# Patient Record
Sex: Female | Born: 1955 | Race: White | Hispanic: No | Smoking: Never smoker
Health system: Southern US, Community
[De-identification: ages and names within clinical notes are randomized; demographics above are authoritative.]

## PROBLEM LIST (undated history)

## (undated) DIAGNOSIS — R51 Headache: Secondary | ICD-10-CM

## (undated) DIAGNOSIS — H168 Other keratitis: Secondary | ICD-10-CM

## (undated) DIAGNOSIS — I639 Cerebral infarction, unspecified: Secondary | ICD-10-CM

## (undated) DIAGNOSIS — G4700 Insomnia, unspecified: Secondary | ICD-10-CM

## (undated) DIAGNOSIS — M3500 Sicca syndrome, unspecified: Secondary | ICD-10-CM

## (undated) DIAGNOSIS — I671 Cerebral aneurysm, nonruptured: Secondary | ICD-10-CM

## (undated) DIAGNOSIS — I4891 Unspecified atrial fibrillation: Secondary | ICD-10-CM

## (undated) DIAGNOSIS — I10 Essential (primary) hypertension: Secondary | ICD-10-CM

## (undated) DIAGNOSIS — N179 Acute kidney failure, unspecified: Secondary | ICD-10-CM

## (undated) HISTORY — DX: Cerebral aneurysm, nonruptured: I67.1

## (undated) HISTORY — DX: Sjogren syndrome, unspecified: M35.00

## (undated) HISTORY — DX: Headache: R51

## (undated) HISTORY — DX: Cerebral infarction, unspecified: I63.9

## (undated) HISTORY — PX: ABDOMINAL HYSTERECTOMY: SHX81

## (undated) HISTORY — PX: OVARIAN CYST REMOVAL: SHX89

## (undated) HISTORY — DX: Acute kidney failure, unspecified: N17.9

## (undated) HISTORY — DX: Essential (primary) hypertension: I10

## (undated) HISTORY — DX: Insomnia, unspecified: G47.00

## (undated) HISTORY — DX: Other keratitis: H16.8

## (undated) HISTORY — DX: Unspecified atrial fibrillation: I48.91

---

## 1996-05-23 HISTORY — PX: CEREBRAL ANEURYSM REPAIR: SHX164

## 1998-07-13 ENCOUNTER — Other Ambulatory Visit: Admission: RE | Admit: 1998-07-13 | Discharge: 1998-07-13 | Payer: Self-pay | Admitting: Obstetrics & Gynecology

## 1999-04-22 ENCOUNTER — Encounter: Payer: Self-pay | Admitting: Emergency Medicine

## 1999-04-22 ENCOUNTER — Encounter: Admission: RE | Admit: 1999-04-22 | Discharge: 1999-04-22 | Payer: Self-pay | Admitting: Emergency Medicine

## 1999-05-27 ENCOUNTER — Encounter: Payer: Self-pay | Admitting: Obstetrics & Gynecology

## 1999-05-31 ENCOUNTER — Inpatient Hospital Stay (HOSPITAL_COMMUNITY): Admission: RE | Admit: 1999-05-31 | Discharge: 1999-06-02 | Payer: Self-pay | Admitting: Obstetrics & Gynecology

## 2001-07-03 ENCOUNTER — Emergency Department (HOSPITAL_COMMUNITY): Admission: EM | Admit: 2001-07-03 | Discharge: 2001-07-03 | Payer: Self-pay | Admitting: Emergency Medicine

## 2001-07-03 ENCOUNTER — Encounter: Payer: Self-pay | Admitting: Emergency Medicine

## 2004-10-25 ENCOUNTER — Ambulatory Visit: Payer: Self-pay | Admitting: Family Medicine

## 2005-05-17 ENCOUNTER — Encounter: Admission: RE | Admit: 2005-05-17 | Discharge: 2005-05-17 | Payer: Self-pay | Admitting: Internal Medicine

## 2005-10-27 ENCOUNTER — Ambulatory Visit: Payer: Self-pay | Admitting: Family Medicine

## 2006-06-19 ENCOUNTER — Encounter: Admission: RE | Admit: 2006-06-19 | Discharge: 2006-06-19 | Payer: Self-pay | Admitting: Family Medicine

## 2006-07-20 ENCOUNTER — Ambulatory Visit: Payer: Self-pay | Admitting: Family Medicine

## 2006-11-21 ENCOUNTER — Ambulatory Visit: Payer: Self-pay | Admitting: Family Medicine

## 2006-11-21 DIAGNOSIS — N809 Endometriosis, unspecified: Secondary | ICD-10-CM | POA: Insufficient documentation

## 2006-11-21 DIAGNOSIS — I10 Essential (primary) hypertension: Secondary | ICD-10-CM | POA: Insufficient documentation

## 2006-11-21 DIAGNOSIS — Z9189 Other specified personal risk factors, not elsewhere classified: Secondary | ICD-10-CM | POA: Insufficient documentation

## 2006-11-22 LAB — CONVERTED CEMR LAB
ALT: 22 units/L (ref 0–35)
AST: 21 units/L (ref 0–37)
Albumin: 4.4 g/dL (ref 3.5–5.2)
Alkaline Phosphatase: 71 units/L (ref 39–117)
BUN: 13 mg/dL (ref 6–23)
Basophils Absolute: 0 10*3/uL (ref 0.0–0.1)
Basophils Relative: 0.3 % (ref 0.0–1.0)
Bilirubin, Direct: 0.1 mg/dL (ref 0.0–0.3)
CO2: 34 meq/L — ABNORMAL HIGH (ref 19–32)
Calcium: 9.6 mg/dL (ref 8.4–10.5)
Chloride: 103 meq/L (ref 96–112)
Creatinine, Ser: 0.8 mg/dL (ref 0.4–1.2)
Eosinophils Absolute: 0.4 10*3/uL (ref 0.0–0.6)
Eosinophils Relative: 3.7 % (ref 0.0–5.0)
GFR calc Af Amer: 98 mL/min
GFR calc non Af Amer: 81 mL/min
Glucose, Bld: 89 mg/dL (ref 70–99)
HCT: 43.2 % (ref 36.0–46.0)
Hemoglobin: 14 g/dL (ref 12.0–15.0)
Lymphocytes Relative: 26.1 % (ref 12.0–46.0)
MCHC: 32.5 g/dL (ref 30.0–36.0)
MCV: 89.9 fL (ref 78.0–100.0)
Monocytes Absolute: 0.9 10*3/uL — ABNORMAL HIGH (ref 0.2–0.7)
Monocytes Relative: 9.2 % (ref 3.0–11.0)
Neutro Abs: 5.8 10*3/uL (ref 1.4–7.7)
Neutrophils Relative %: 60.7 % (ref 43.0–77.0)
Platelets: 374 10*3/uL (ref 150–400)
Potassium: 3.4 meq/L — ABNORMAL LOW (ref 3.5–5.1)
RBC: 4.81 M/uL (ref 3.87–5.11)
RDW: 12.1 % (ref 11.5–14.6)
Sodium: 143 meq/L (ref 135–145)
TSH: 0.55 microintl units/mL (ref 0.35–5.50)
Total Bilirubin: 0.8 mg/dL (ref 0.3–1.2)
Total Protein: 7.6 g/dL (ref 6.0–8.3)
WBC: 9.6 10*3/uL (ref 4.5–10.5)

## 2007-02-28 ENCOUNTER — Telehealth: Payer: Self-pay | Admitting: Family Medicine

## 2007-06-21 ENCOUNTER — Encounter: Admission: RE | Admit: 2007-06-21 | Discharge: 2007-06-21 | Payer: Self-pay | Admitting: Family Medicine

## 2007-11-22 ENCOUNTER — Ambulatory Visit: Payer: Self-pay | Admitting: Family Medicine

## 2007-11-22 DIAGNOSIS — R519 Headache, unspecified: Secondary | ICD-10-CM | POA: Insufficient documentation

## 2007-11-22 DIAGNOSIS — R51 Headache: Secondary | ICD-10-CM | POA: Insufficient documentation

## 2007-11-22 DIAGNOSIS — G4700 Insomnia, unspecified: Secondary | ICD-10-CM | POA: Insufficient documentation

## 2007-11-27 LAB — CONVERTED CEMR LAB
Albumin: 4 g/dL (ref 3.5–5.2)
Alkaline Phosphatase: 83 units/L (ref 39–117)
BUN: 10 mg/dL (ref 6–23)
Basophils Absolute: 0.1 10*3/uL (ref 0.0–0.1)
Bilirubin, Direct: 0.1 mg/dL (ref 0.0–0.3)
Eosinophils Absolute: 0.3 10*3/uL (ref 0.0–0.7)
GFR calc Af Amer: 136 mL/min
GFR calc non Af Amer: 112 mL/min
HCT: 42 % (ref 36.0–46.0)
HDL: 41 mg/dL (ref >39.0)
MCHC: 33.7 g/dL (ref 30.0–36.0)
MCV: 88.8 fL (ref 78.0–100.0)
Monocytes Absolute: 0.7 10*3/uL (ref 0.1–1.0)
Neutrophils Relative %: 57.4 % (ref 43.0–77.0)
Platelets: 318 10*3/uL (ref 150–400)
Potassium: 3.6 meq/L (ref 3.5–5.1)
RDW: 12.1 % (ref 11.5–14.6)
Sodium: 140 meq/L (ref 135–145)
Total Bilirubin: 0.8 mg/dL (ref 0.3–1.2)
Triglycerides: 111 mg/dL (ref 0–149)
VLDL: 22 mg/dL (ref 0–40)

## 2008-06-23 ENCOUNTER — Encounter: Admission: RE | Admit: 2008-06-23 | Discharge: 2008-06-23 | Payer: Self-pay | Admitting: Family Medicine

## 2008-07-21 ENCOUNTER — Telehealth: Payer: Self-pay | Admitting: Family Medicine

## 2008-12-01 ENCOUNTER — Ambulatory Visit: Payer: Self-pay | Admitting: Family Medicine

## 2008-12-04 ENCOUNTER — Ambulatory Visit: Payer: Self-pay | Admitting: Family Medicine

## 2008-12-05 LAB — CONVERTED CEMR LAB
Alkaline Phosphatase: 74 units/L (ref 39–117)
BUN: 15 mg/dL (ref 6–23)
Basophils Absolute: 0.1 10*3/uL (ref 0.0–0.1)
Bilirubin, Direct: 0.1 mg/dL (ref 0.0–0.3)
CO2: 32 meq/L (ref 19–32)
Chloride: 101 meq/L (ref 96–112)
Cholesterol: 156 mg/dL (ref 0–200)
Eosinophils Absolute: 0.3 10*3/uL (ref 0.0–0.7)
LDL Cholesterol: 94 mg/dL (ref 0–99)
Lymphocytes Relative: 29.5 % (ref 12.0–46.0)
MCHC: 33.7 g/dL (ref 30.0–36.0)
Neutrophils Relative %: 54.5 % (ref 43.0–77.0)
Potassium: 2.9 meq/L — ABNORMAL LOW (ref 3.5–5.1)
RBC: 4.54 M/uL (ref 3.87–5.11)
RDW: 11.9 % (ref 11.5–14.6)
Specific Gravity, Urine: 1.015 (ref 1.000–1.030)
Total Protein, Urine: NEGATIVE mg/dL
Urine Glucose: NEGATIVE mg/dL
VLDL: 20.6 mg/dL (ref 0.0–40.0)

## 2009-03-11 ENCOUNTER — Telehealth: Payer: Self-pay | Admitting: Family Medicine

## 2009-06-29 ENCOUNTER — Encounter: Admission: RE | Admit: 2009-06-29 | Discharge: 2009-06-29 | Payer: Self-pay | Admitting: Family Medicine

## 2009-09-21 ENCOUNTER — Telehealth: Payer: Self-pay | Admitting: Family Medicine

## 2009-11-30 ENCOUNTER — Ambulatory Visit: Payer: Self-pay | Admitting: Family Medicine

## 2009-12-02 ENCOUNTER — Ambulatory Visit: Payer: Self-pay | Admitting: Family Medicine

## 2009-12-07 LAB — CONVERTED CEMR LAB
Alkaline Phosphatase: 91 units/L (ref 39–117)
Basophils Relative: 0.4 % (ref 0.0–3.0)
Bilirubin, Direct: 0.1 mg/dL (ref 0.0–0.3)
CO2: 30 meq/L (ref 19–32)
Direct LDL: 63 mg/dL
Eosinophils Absolute: 0.3 10*3/uL (ref 0.0–0.7)
GFR calc non Af Amer: 99.47 mL/min (ref >60)
Glucose, Bld: 99 mg/dL (ref 70–99)
HDL: 34.6 mg/dL — ABNORMAL LOW (ref >39.00)
Lymphocytes Relative: 28.5 % (ref 12.0–46.0)
MCHC: 34.9 g/dL (ref 30.0–36.0)
Neutrophils Relative %: 55.3 % (ref 43.0–77.0)
Potassium: 3.5 meq/L (ref 3.5–5.1)
RBC: 4.39 M/uL (ref 3.87–5.11)
Sodium: 141 meq/L (ref 135–145)
Specific Gravity, Urine: 1.02 (ref 1.000–1.030)
Total Bilirubin: 0.7 mg/dL (ref 0.3–1.2)
Total Protein, Urine: NEGATIVE mg/dL
Total Protein: 7 g/dL (ref 6.0–8.3)
Urine Glucose: NEGATIVE mg/dL
VLDL: 66.2 mg/dL — ABNORMAL HIGH (ref 0.0–40.0)
WBC: 6.6 10*3/uL (ref 4.5–10.5)
pH: 6 (ref 5.0–8.0)

## 2010-04-09 ENCOUNTER — Telehealth: Payer: Self-pay | Admitting: Family Medicine

## 2010-06-13 ENCOUNTER — Encounter: Payer: Self-pay | Admitting: Family Medicine

## 2010-06-22 NOTE — Progress Notes (Signed)
Summary: rx temazepam   Phone Note From Pharmacy   [facility]: [facility]  [address] [id number] Summary of Call: refill temazepam  15 mg  Initial call taken by: Pura Spice, RN,  April 09, 2010 3:40 PM  Follow-up for Phone Call        call in #30 with 5 rf  Follow-up by: Nelwyn Salisbury MD,  April 09, 2010 4:11 PM  Additional Follow-up for Phone Call Additional follow up Details #1::        done   Additional Follow-up by: Pura Spice, RN,  April 09, 2010 4:16 PM    Prescriptions: TEMAZEPAM 15 MG CAPS (TEMAZEPAM) 1 by mouth at bedtime  #30 x 5   Entered by:   Pura Spice, RN   Authorized by:   Nelwyn Salisbury MD   Signed by:   Pura Spice, RN on 04/09/2010   Method used:   Telephoned to ...       [facility]  [address] [id number]       [address]       [place], [place]  [address]       Ph: [phone]       Fax: [phone]   RxID:   [id number]

## 2010-06-22 NOTE — Progress Notes (Signed)
Summary: refill Temazepam  Phone Note Refill Request Message from:  Fax from Pharmacy on Sep 21, 2009 10:49 AM  Refills Requested: Medication #1:  TEMAZEPAM 15 MG CAPS 1 by mouth at bedtime   Dosage confirmed as above?Dosage Confirmed   Supply Requested: 1 month   Last Refilled: 05/29/2009  Method Requested: Telephone to Pharmacy Initial call taken by: Raechel Ache, RN,  Sep 21, 2009 10:49 AM Call[facility]: [facility]  [address] [phone]*  Follow-up for Phone Call        call in #30 with 5 rf Follow-up by: Nelwyn Salisbury MD,  Sep 21, 2009 12:54 PM  Additional Follow-up for Phone Call Additional follow up Details #1::        Rx faxed to pharmacy Additional Follow-up by: Raechel Ache, RN,  Sep 21, 2009 1:19 PM    Prescriptions: TEMAZEPAM 15 MG CAPS (TEMAZEPAM) 1 by mouth at bedtime  #30 x 5   Entered by:   Raechel Ache, RN   Authorized by:   Nelwyn Salisbury MD   Signed by:   Raechel Ache, RN on 09/21/2009   Method used:   Historical   RxID:   [id number]

## 2010-06-22 NOTE — Assessment & Plan Note (Signed)
Summary: FUP ON MEDS//CCM   Vital Signs:  Patient profile:   55 year old female Weight:      170 pounds BMI:     25.20 BP sitting:   112 / 78  (left arm) Cuff size:   regular  Vitals Entered By: Raechel Ache, RN (November 30, 2009 3:27 PM) CC: F/u meds.   History of Present Illness: Here for a one year follow up. She feesl fine and has no concerns. Her BP is stable.   Allergies (verified): No Known Drug Allergies  Past History:  Past Medical History: Reviewed history from 11/22/2007 and no changes required. Hypertension Headache insomnia  Review of Systems  The patient denies anorexia, fever, weight loss, weight gain, vision loss, decreased hearing, hoarseness, chest pain, syncope, dyspnea on exertion, peripheral edema, prolonged cough, headaches, hemoptysis, abdominal pain, melena, hematochezia, severe indigestion/heartburn, hematuria, incontinence, genital sores, muscle weakness, suspicious skin lesions, transient blindness, difficulty walking, depression, unusual weight change, abnormal bleeding, enlarged lymph nodes, angioedema, breast masses, and testicular masses.    Physical Exam  General:  Well-developed,well-nourished,in no acute distress; alert,appropriate and cooperative throughout examination Neck:  No deformities, masses, or tenderness noted. Lungs:  Normal respiratory effort, chest expands symmetrically. Lungs are clear to auscultation, no crackles or wheezes. Heart:  Normal rate and regular rhythm. S1 and S2 normal without gallop, murmur, click, rub or other extra sounds. Extremities:  No clubbing, cyanosis, edema, or deformity noted with normal full range of motion of all joints.     Impression & Recommendations:  Problem # 1:  INSOMNIA (ICD-780.52)  Her updated medication list for this problem includes:    Temazepam 15 Mg Caps (Temazepam) .[name]... 1 by mouth at bedtime  Problem # 2:  HEADACHE (ICD-784.0)  Problem # 3:  HYPERTENSION (ICD-401.9)  Her  updated medication list for this problem includes:    Nifedipine 90 Mg Tb24 (Nifedipine) ..... Once daily    Hydrochlorothiazide 12.5 Mg Tabs (Hydrochlorothiazide) ..... Once daily  Complete Medication List: 1)  Nifedipine 90 Mg Tb24 (Nifedipine) .... Once daily 2)  Temazepam 15 Mg Caps (Temazepam) .[name].. 1 by mouth at bedtime 3)  Hydrochlorothiazide 12.5 Mg Tabs (Hydrochlorothiazide) .... Once daily 4)  Klor-con M20 20 Meq Cr-tabs (Potassium chloride crys cr) .[name].. 1 by mouth once daily  Patient Instructions: 1)  set up fasting labs Prescriptions: KLOR-CON M20 20 MEQ CR-TABS (POTASSIUM CHLORIDE CRYS CR) 1 by mouth once daily  #30 x 11   Entered and Authorized by:   Nelwyn Salisbury MD   Signed by:   Nelwyn Salisbury MD on 11/30/2009   Method used:   [facility]ronically to        [facility]  [address] [phone][facility] (retail)       [address]       [place], [place]  [address]       Ph: [phone]       Fax: [phone]   RxID:   [id number] HYDROCHLOROTHIAZIDE 12.5 MG TABS (HYDROCHLOROTHIAZIDE) once daily  #30 x 11   Entered and Authorized by:   Nelwyn Salisbury MD   Signed by:   Nelwyn Salisbury MD on 11/30/2009   Method used:   [facility]ronically to        [facility]  [address] [id number]       [address] [facility],  [place]  [address]       Ph: [phone]       Fax: [phone]   RxID:   [id number] NIFEDIPINE 90 MG TB24 (NIFEDIPINE) once daily  #30 x 11   Entered and Authorized by:   Nelwyn Salisbury MD   Signed by:   Nelwyn Salisbury MD on 11/30/2009   Method used:   [facility]ronically to        [facility]  [address] [phone]* (retail)       [address]       [place], [place]  [address]       Ph: [phone]       Fax: [phone]   RxID:   [id number]

## 2010-07-01 ENCOUNTER — Other Ambulatory Visit: Payer: Self-pay | Admitting: Family Medicine

## 2010-07-01 DIAGNOSIS — Z1231 Encounter for screening mammogram for malignant neoplasm of breast: Secondary | ICD-10-CM

## 2010-07-12 ENCOUNTER — Ambulatory Visit: Payer: Self-pay

## 2010-07-14 ENCOUNTER — Ambulatory Visit
Admission: RE | Admit: 2010-07-14 | Discharge: 2010-07-14 | Disposition: A | Discharge status: Home / Self Care | Payer: BC Managed Care – PPO | Source: Ambulatory Visit | Attending: Family Medicine | Admitting: Family Medicine

## 2010-07-14 DIAGNOSIS — Z1231 Encounter for screening mammogram for malignant neoplasm of breast: Secondary | ICD-10-CM

## 2010-11-09 ENCOUNTER — Ambulatory Visit (INDEPENDENT_AMBULATORY_CARE_PROVIDER_SITE_OTHER): Payer: BC Managed Care – PPO | Admitting: Family Medicine

## 2010-11-09 ENCOUNTER — Encounter: Payer: Self-pay | Admitting: Family Medicine

## 2010-11-09 VITALS — BP 132/84 | HR 80 | Temp 98.8°F

## 2010-11-09 DIAGNOSIS — B029 Zoster without complications: Secondary | ICD-10-CM

## 2010-11-09 MED ORDER — PREDNISONE (PAK) 10 MG PO TABS: ORAL_TABLET | ORAL | Status: DC

## 2010-11-09 MED ORDER — HYDROCODONE-ACETAMINOPHEN 5-500 MG PO TABS: 1.0000 | ORAL_TABLET | ORAL | Status: AC | PRN

## 2010-11-09 MED ORDER — VALACYCLOVIR HCL 1 G PO TABS: 1000.0000 mg | ORAL_TABLET | Freq: Three times a day (TID) | ORAL | Status: DC

## 2010-11-09 NOTE — Progress Notes (Signed)
  Subjective:    Patient ID: [name], female    DOB: [birth year], 55 y.o.   MRN: [id number]  HPI Here for 3 days of burning pains in the right shoulder and down the left arm. Then yesterday she developed a red blistering rash on the right  neck, right shoulder, and right  arm. The rash is more dense today.    Review of Systems  Constitutional: Negative.   Musculoskeletal: Positive for arthralgias.  Skin: Positive for rash.       Objective:   Physical Exam  Constitutional: She appears well-developed and well-nourished.  Musculoskeletal: Normal range of motion. She exhibits no edema and no tenderness.  Skin:       Scattered patches of red macular or papular or vesicular rashes as above           Assessment & Plan:  We discussed the course of shingles. She will wear long sleeved shirts when in public. Recheck prn

## 2010-12-07 ENCOUNTER — Other Ambulatory Visit: Payer: Self-pay

## 2010-12-07 NOTE — Telephone Encounter (Signed)
Last filled 08/31/2010. Last ov 11/09/2010/acute

## 2010-12-08 NOTE — Telephone Encounter (Signed)
Call in #30 only with no refills. She needs an OV soon (it's been one year)

## 2010-12-09 MED ORDER — TEMAZEPAM 15 MG PO CAPS: 15.0000 mg | ORAL_CAPSULE | Freq: Every evening | ORAL | Status: DC | PRN

## 2010-12-09 NOTE — Telephone Encounter (Signed)
DONE

## 2010-12-30 ENCOUNTER — Other Ambulatory Visit: Payer: Self-pay | Admitting: Family Medicine

## 2010-12-31 ENCOUNTER — Telehealth: Payer: Self-pay | Admitting: Family Medicine

## 2010-12-31 MED ORDER — NIFEDIPINE ER 90 MG PO TB24: 90.0000 mg | ORAL_TABLET | Freq: Every day | ORAL | Status: DC

## 2010-12-31 NOTE — Telephone Encounter (Signed)
Script called in. I tried to call pt and line was busy.

## 2010-12-31 NOTE — Telephone Encounter (Signed)
Pt is out of her BP med, Nifedipine 90mg . She is due for a physical, which I made for 8/20. She would like enough meds called in to get her through. She missed yesterday's dose. [facility] [address]

## 2011-01-05 NOTE — Telephone Encounter (Signed)
Script sent e-scribe 

## 2011-01-10 ENCOUNTER — Ambulatory Visit (INDEPENDENT_AMBULATORY_CARE_PROVIDER_SITE_OTHER): Payer: BC Managed Care – PPO | Admitting: Family Medicine

## 2011-01-10 ENCOUNTER — Encounter: Payer: Self-pay | Admitting: Family Medicine

## 2011-01-10 VITALS — BP 136/88 | HR 80 | Temp 98.1°F | Ht 67.75 in | Wt 165.0 lb

## 2011-01-10 DIAGNOSIS — Z0000 Encounter for general adult medical examination without abnormal findings: Secondary | ICD-10-CM

## 2011-01-10 MED ORDER — NIFEDIPINE ER OSMOTIC RELEASE 90 MG PO TB24: 90.0000 mg | ORAL_TABLET | Freq: Every day | ORAL | Status: DC

## 2011-01-10 MED ORDER — ZOLPIDEM TARTRATE 10 MG PO TABS: 10.0000 mg | ORAL_TABLET | Freq: Every evening | ORAL | Status: DC | PRN

## 2011-01-10 MED ORDER — POTASSIUM CHLORIDE CRYS ER 20 MEQ PO TBCR: 20.0000 meq | EXTENDED_RELEASE_TABLET | Freq: Every day | ORAL | Status: DC

## 2011-01-10 NOTE — Progress Notes (Signed)
  Subjective:    Patient ID: [name], female    DOB: 1955/09/11, 55 y.o.   MRN: [id number]  HPI 55 yr old female for a cpx. She feels well in general. She would like to try Ambien for sleep because temazepam is not helping much. She sees Dr. Marlyne Beards for GYN exams.    Review of Systems  Constitutional: Negative.  Negative for fever, diaphoresis, activity change, appetite change, fatigue and unexpected weight change.  HENT: Negative.  Negative for hearing loss, ear pain, nosebleeds, congestion, sore throat, trouble swallowing, neck pain, neck stiffness, voice change and tinnitus.   Eyes: Negative.  Negative for photophobia, pain, discharge, redness and visual disturbance.  Respiratory: Negative.  Negative for apnea, cough, choking, chest tightness, shortness of breath, wheezing and stridor.   Cardiovascular: Negative.  Negative for chest pain, palpitations and leg swelling.  Gastrointestinal: Negative.  Negative for nausea, vomiting, abdominal pain, diarrhea, constipation, blood in stool, abdominal distention and rectal pain.  Genitourinary: Negative.  Negative for dysuria, urgency, frequency, hematuria, flank pain, decreased urine volume, vaginal bleeding, vaginal discharge, enuresis, difficulty urinating, vaginal pain, menstrual problem, pelvic pain and dyspareunia.  Musculoskeletal: Negative.  Negative for myalgias, back pain, joint swelling, arthralgias and gait problem.  Skin: Negative.  Negative for color change, pallor, rash and wound.  Neurological: Negative.  Negative for dizziness, tremors, seizures, syncope, speech difficulty, weakness, light-headedness, numbness and headaches.  Hematological: Negative.  Negative for adenopathy. Does not bruise/bleed easily.  Psychiatric/Behavioral: Negative.  Negative for hallucinations, behavioral problems, confusion, sleep disturbance, dysphoric mood and agitation. The patient is not nervous/anxious.        Objective:   Physical Exam    Constitutional: She is oriented to person, place, and time. She appears well-developed and well-nourished. No distress.  HENT:  Head: Normocephalic and atraumatic.  Right Ear: External ear normal.  Left Ear: External ear normal.  Nose: Nose normal.  Mouth/Throat: Oropharynx is clear and moist. No oropharyngeal exudate.  Eyes: Conjunctivae and EOM are normal. Pupils are equal, round, and reactive to light. No scleral icterus.  Neck: Normal range of motion. Neck supple. No JVD present. No thyromegaly present.  Cardiovascular: Normal rate, regular rhythm, normal heart sounds and intact distal pulses.  Exam reveals no gallop and no friction rub.   No murmur heard.      EKG normal   Pulmonary/Chest: Effort normal and breath sounds normal. No respiratory distress. She has no wheezes. She has no rales. She exhibits no tenderness.  Abdominal: Soft. Bowel sounds are normal. She exhibits no distension and no mass. There is no tenderness. There is no rebound and no guarding.  Musculoskeletal: Normal range of motion. She exhibits no edema and no tenderness.  Lymphadenopathy:    She has no cervical adenopathy.  Neurological: She is alert and oriented to person, place, and time. She has normal reflexes. No cranial nerve deficit. She exhibits normal muscle tone. Coordination normal.  Skin: Skin is warm and dry. No rash noted. No erythema.  Psychiatric: She has a normal mood and affect. Her behavior is normal. Judgment and thought content normal.          Assessment & Plan:  We will set her up for fasting labs soon. Set up her first colonoscopy. Try Ambien.

## 2011-01-27 ENCOUNTER — Telehealth: Payer: Self-pay | Admitting: Family Medicine

## 2011-01-27 ENCOUNTER — Ambulatory Visit: Payer: BC Managed Care – PPO

## 2011-01-27 DIAGNOSIS — Z0000 Encounter for general adult medical examination without abnormal findings: Secondary | ICD-10-CM

## 2011-01-27 LAB — URINALYSIS
Bilirubin Urine: NEGATIVE
Hgb urine dipstick: NEGATIVE
Ketones, ur: NEGATIVE
Leukocytes, UA: NEGATIVE
Nitrite: NEGATIVE

## 2011-01-27 LAB — CBC WITH DIFFERENTIAL/PLATELET
Basophils Relative: 0.4 % (ref 0.0–3.0)
Eosinophils Absolute: 0.2 10*3/uL (ref 0.0–0.7)
Lymphocytes Relative: 28.3 % (ref 12.0–46.0)
MCHC: 32.9 g/dL (ref 30.0–36.0)
MCV: 89.1 fl (ref 78.0–100.0)
Monocytes Absolute: 0.6 10*3/uL (ref 0.1–1.0)
Neutrophils Relative %: 52 % (ref 43.0–77.0)
Platelets: 230 10*3/uL (ref 150.0–400.0)
RBC: 4.63 Mil/uL (ref 3.87–5.11)
WBC: 3.9 10*3/uL — ABNORMAL LOW (ref 4.5–10.5)

## 2011-01-27 LAB — LIPID PANEL
Cholesterol: 125 mg/dL (ref 0–200)
HDL: 42.2 mg/dL (ref >39.00)
Triglycerides: 105 mg/dL (ref 0.0–149.0)
VLDL: 21 mg/dL (ref 0.0–40.0)

## 2011-01-27 LAB — HEPATIC FUNCTION PANEL
ALT: 18 U/L (ref 0–35)
AST: 22 U/L (ref 0–37)
Total Protein: 7.8 g/dL (ref 6.0–8.3)

## 2011-01-27 LAB — BASIC METABOLIC PANEL
BUN: 13 mg/dL (ref 6–23)
CO2: 29 mEq/L (ref 19–32)
Calcium: 8.9 mg/dL (ref 8.4–10.5)
Creatinine, Ser: 0.6 mg/dL (ref 0.4–1.2)

## 2011-01-27 LAB — TSH: TSH: 0.59 u[IU]/mL (ref 0.35–5.50)

## 2011-01-27 NOTE — Telephone Encounter (Signed)
Lab order placed in computer, pt is at [facility] for lab draw.

## 2011-02-02 ENCOUNTER — Telehealth: Payer: Self-pay | Admitting: Family Medicine

## 2011-02-02 NOTE — Telephone Encounter (Signed)
Message copied by Baldemar Friday on Wed Feb 02, 2011  1:37 PM ------      Message from: Gershon Crane A      Created: Tue Feb 01, 2011  5:25 AM       normal

## 2011-02-02 NOTE — Telephone Encounter (Signed)
Left message with results

## 2011-02-03 ENCOUNTER — Inpatient Hospital Stay (HOSPITAL_COMMUNITY)
Admission: EM | Admit: 2011-02-03 | Discharge: 2011-02-07 | DRG: 813 | Disposition: A | Discharge status: Home / Self Care | Payer: BC Managed Care – PPO | Attending: Internal Medicine | Admitting: Internal Medicine

## 2011-02-03 ENCOUNTER — Emergency Department (HOSPITAL_COMMUNITY): Payer: BC Managed Care – PPO

## 2011-02-03 DIAGNOSIS — I10 Essential (primary) hypertension: Secondary | ICD-10-CM | POA: Diagnosis present

## 2011-02-03 DIAGNOSIS — E876 Hypokalemia: Secondary | ICD-10-CM | POA: Diagnosis present

## 2011-02-03 DIAGNOSIS — A09 Infectious gastroenteritis and colitis, unspecified: Principal | ICD-10-CM | POA: Diagnosis present

## 2011-02-03 DIAGNOSIS — Z79899 Other long term (current) drug therapy: Secondary | ICD-10-CM

## 2011-02-03 DIAGNOSIS — Z8249 Family history of ischemic heart disease and other diseases of the circulatory system: Secondary | ICD-10-CM

## 2011-02-03 LAB — DIFFERENTIAL
Basophils Absolute: 0 10*3/uL (ref 0.0–0.1)
Basophils Relative: 0 % (ref 0–1)
Lymphocytes Relative: 9 % — ABNORMAL LOW (ref 12–46)
Monocytes Absolute: 1.1 10*3/uL — ABNORMAL HIGH (ref 0.1–1.0)
Neutro Abs: 11.1 10*3/uL — ABNORMAL HIGH (ref 1.7–7.7)
Neutrophils Relative %: 82 % — ABNORMAL HIGH (ref 43–77)

## 2011-02-03 LAB — CBC
HCT: 41.7 % (ref 36.0–46.0)
Hemoglobin: 14.7 g/dL (ref 12.0–15.0)
RBC: 4.88 MIL/uL (ref 3.87–5.11)
WBC: 13.6 10*3/uL — ABNORMAL HIGH (ref 4.0–10.5)

## 2011-02-03 LAB — BASIC METABOLIC PANEL
BUN: 18 mg/dL (ref 6–23)
CO2: 33 mEq/L — ABNORMAL HIGH (ref 19–32)
Chloride: 94 mEq/L — ABNORMAL LOW (ref 96–112)
GFR calc non Af Amer: >60 mL/min (ref >60)
Glucose, Bld: 108 mg/dL — ABNORMAL HIGH (ref 70–99)
Potassium: 2.9 mEq/L — ABNORMAL LOW (ref 3.5–5.1)
Sodium: 138 mEq/L (ref 135–145)

## 2011-02-04 ENCOUNTER — Encounter (HOSPITAL_COMMUNITY): Payer: Self-pay | Admitting: Radiology

## 2011-02-04 ENCOUNTER — Emergency Department (HOSPITAL_COMMUNITY): Payer: BC Managed Care – PPO

## 2011-02-04 LAB — HEPATIC FUNCTION PANEL
Alkaline Phosphatase: 86 U/L (ref 39–117)
Bilirubin, Direct: 0.2 mg/dL (ref 0.0–0.3)
Indirect Bilirubin: 0.5 mg/dL (ref 0.3–0.9)
Total Bilirubin: 0.7 mg/dL (ref 0.3–1.2)
Total Protein: 8.1 g/dL (ref 6.0–8.3)

## 2011-02-04 LAB — LIPASE, BLOOD: Lipase: 18 U/L (ref 11–59)

## 2011-02-04 LAB — URINALYSIS, ROUTINE W REFLEX MICROSCOPIC
Bilirubin Urine: NEGATIVE
Hgb urine dipstick: NEGATIVE
Specific Gravity, Urine: 1.03 (ref 1.005–1.030)
Urobilinogen, UA: 1 mg/dL (ref 0.0–1.0)
pH: 6 (ref 5.0–8.0)

## 2011-02-04 LAB — URINE MICROSCOPIC-ADD ON

## 2011-02-04 MED ORDER — IOHEXOL 300 MG/ML  SOLN
80.0000 mL | Freq: Once | INTRAMUSCULAR | Status: AC | PRN
  Administered 2011-02-04: 80 mL via INTRAVENOUS

## 2011-02-05 ENCOUNTER — Observation Stay (HOSPITAL_COMMUNITY): Payer: BC Managed Care – PPO

## 2011-02-05 LAB — COMPREHENSIVE METABOLIC PANEL
ALT: 12 U/L (ref 0–35)
Albumin: 3.3 g/dL — ABNORMAL LOW (ref 3.5–5.2)
Alkaline Phosphatase: 71 U/L (ref 39–117)
Calcium: 8.7 mg/dL (ref 8.4–10.5)
Potassium: 5 mEq/L (ref 3.5–5.1)
Sodium: 139 mEq/L (ref 135–145)
Total Protein: 6.8 g/dL (ref 6.0–8.3)

## 2011-02-05 LAB — DIFFERENTIAL
Basophils Absolute: 0 10*3/uL (ref 0.0–0.1)
Basophils Relative: 0 % (ref 0–1)
Eosinophils Absolute: 0.2 10*3/uL (ref 0.0–0.7)
Eosinophils Relative: 3 % (ref 0–5)

## 2011-02-05 LAB — CBC
MCHC: 33.7 g/dL (ref 30.0–36.0)
Platelets: 179 10*3/uL (ref 150–400)
RDW: 12.6 % (ref 11.5–15.5)
WBC: 5.1 10*3/uL (ref 4.0–10.5)

## 2011-02-06 NOTE — Discharge Summary (Signed)
[name], FULGHUM NO.:  [id number]  MEDICAL RECORD NO.:  [id number]  LOCATION:  5128                         FACILITY:  [facility]  PHYSICIAN:  Jeoffrey Massed, MD    DATE OF BIRTH:  11-17-1955  DATE OF ADMISSION:  02/03/2011 DATE OF DISCHARGE:  02/05/2011                        DISCHARGE SUMMARY - REFERRING   PRIMARY CARE PRACTITIONER:  Dr. Gershon Crane.  PRIMARY DISCHARGE DIAGNOSES: 1. Enteritis, significantly better. 2. Hypokalemia.  SECONDARY DISCHARGE DIAGNOSES/PAST MEDICAL HISTORY:  Significant for: 1. Hypertension. 2. History of brain aneurysm, status post surgery by Dr. Newell Coral.  DISCHARGE MEDICATIONS: 1. Ciprofloxacin 500 mg 1 tablet twice daily for 7 days. 2. Flagyl 500 mg 1 tablet p.o. every 8 hours for 7 days. 3. Phenergan 12.5 mg half a tablet p.o. 6 hours p.r.n. 4. Ibuprofen 200 mg 2-3 tablets every 4 hours p.r.n. 5. Procardia XL 90 mg 1 tablet p.o. every morning. 6. Extra Strength Tylenol 500 mg 1-2 tablets p.o. daily p.r.n.  CONSULTANTS ON THE CASE:  None.  BRIEF HISTORY OF PRESENT ILLNESS:  The patient is a 55 year old female who presented with a 2-day history of abdominal pain associated with nausea, vomiting and diarrhea.  She was then admitted to the hospitalist service for further evaluation and treatment.  PERTINENT RADIOLOGICAL STUDIES: 1. X-ray of the abdomen done on February 04, 2011, showed small bowel     dilatation in an obstructive pattern. 2. CT of the abdomen and pelvis with contrast done on February 04, 2011, showed a thickened segment of mid ileum noted within the     upper pelvis measuring 7 cm in length, with relative luminal     narrowing, raising question for an infectious or inflammatory     process.  3.  No definite focal mass to suggest malignancy.  Small     amount of free fluid in the pelvis likely reflects the small bowel     process. 3. Diffuse distention of the small bowel loops to 4.1 cm in  diameter.     However, there is no focal transition point, and the distal ileum     still contains a small amount of air throughout without evidence of     bowel obstruction.  There is gradual decompression of the small     bowel distal to the thickened segment of the mid ileum. 4. X-ray of the abdomen 2 views done on February 05, 2011, showed a     non-obstructive bowel gas pattern with significant improvement in     the dilatation of the small bowel loops.  PERTINENT LABORATORY DATA: 1. CBC on admission showed a WBC of 13.6, hemoglobin of 14.7,     hematocrit of 41.7 and a platelet count of 261. 2. CBC on discharge shows a WBC of 5.1, hemoglobin of 11.8 and a     platelet count of 179. 3. Blood cultures done on February 04, 2011, are negative so far. 4. Lipase level was 18. 5. ESR was 21.  PHYSICAL EXAM AT THE TIME OF DISCHARGE: 1. VITAL SIGNS:  Temperature of 98.2, pulse of 59, blood pressure of     109/65, and pulse ox of 98% on  room air. 2. GENERAL:  Nontoxic looking, sitting in bed without any major     complaints, awake and alert.  Speech clear. 3. HEENT:  Atraumatic, normocephalic.  Pupils are equal and reactive     to light and accommodation. 4. NECK:  Supple. 5. CHEST:  Bilaterally clear to auscultation. 6. CARDIOVASCULAR:  Heart sounds are regular.  No murmurs heard. 7. ABDOMEN:  Soft.  There are good bowel sounds heard today.  There is     very minimal tenderness in the periumbilical area.  Otherwise, it     is nontender, nondistended. 8. EXTREMITIES:  No edema. 9. NEUROLOGIC:  The patient is awake and alert and there are no focal     neurological deficits.  BRIEF HOSPITAL COURSE: 1. Enteritis.  The patient presented with abdominal pain along with     nausea, vomiting and diarrhea.  Please refer to the radiological     studies done on admission and noted as above.  The patient was     admitted and placed on n.p.o. status and given IV fluids.  Because     of the  possibility of enteritis and her significant leukocytosis,     she was empirically started on ciprofloxacin and Flagyl.  She was     then also slowly started on a clear liquid diet.  Today, she has     not had a bowel movement.  However, she is easily passing flatus     and abdominal pain is significantly better.  Today, also she has     been advanced to a full liquid diet which she is tolerating rather     well.  X-ray of abdomen done today shows significant decrease in     the distention of the small bowel.  The patient also reports no     abdominal pain at all.  Current plans are to stop IV fluids and     advance her to a soft bland diet; and if she tolerates that, then     she could be discharged there today.  We will however continue her     on ciprofloxacin and Flagyl for another week or so.  She will     follow up with the primary care doctor, Dr. Gershon Crane, in the     next 1 week.  At this time, it is thought that this is an     infectious enteritis and not thought to be inflammatory in nature     given that this is a first episode.  However, if this were to be     recurrent, then perhaps a GI consultation would be warranted at     some point. 2. Hypertension.  This is stable.  She is to resume her usual     antihypertensive medications.  DISPOSITION:  It is thought that the patient should be able to be discharged home later today if she were to tolerate a soft bland diet.  FOLLOWUP INSTRUCTIONS:  The patient to follow up with her primary care practitioner, Dr. Gershon Crane, in the next 1 week.  She is to call and make an appointment.  She claims understanding.  Total time spent for discharge equals 35 minutes.     Jeoffrey Massed, MD     SG/MEDQ  D:  02/05/2011  T:  02/05/2011  Job:  [id number]  Electronically Signed by Jeoffrey Massed  on 02/06/2011 12:32:21 PM

## 2011-02-07 LAB — BASIC METABOLIC PANEL
BUN: 7 mg/dL (ref 6–23)
CO2: 27 mEq/L (ref 19–32)
GFR calc non Af Amer: >60 mL/min (ref >60)
Glucose, Bld: 107 mg/dL — ABNORMAL HIGH (ref 70–99)
Potassium: 3.5 mEq/L (ref 3.5–5.1)
Sodium: 136 mEq/L (ref 135–145)

## 2011-02-10 LAB — CULTURE, BLOOD (ROUTINE X 2)
Culture  Setup Time: 201209141233
Culture  Setup Time: 201209141233
Culture: NO GROWTH

## 2011-02-24 NOTE — Discharge Summary (Signed)
  [name], [name] NO.:  [id number]  MEDICAL RECORD NO.:  [id number]  LOCATION:  5128                         FACILITY:  [facility]  PHYSICIAN:  Ramiro Harvest, MD    DATE OF BIRTH:  [birth year]  DATE OF ADMISSION:  02/03/2011 DATE OF DISCHARGE:  02/07/2011                        DISCHARGE SUMMARY    PRIMARY CARE PHYSICIAN:  Jeannett Senior A. Clent Ridges, MD  This is an addendum to prior discharge summary dictated by Dr. Jerral Ralph of job [phone].  For the rest of the hospitalization, please see prior discharge summary dictated per Dr. Jerral Ralph.  DISCHARGE DIAGNOSES: 1. Enteritis, improved. 2. Hypokalemia, resolved. 3. Hypertension. 4. History of brain aneurysm status post surgery by Dr. Newell Coral.  DISCHARGE MEDICATIONS: 1. Ciprofloxacin 500 mg p.o. b.i.d. x7 days. 2. Flagyl 500 mg p.o. q.8 h. x7 days. 3. Phenergan 12.5 mg half a tablet p.o. q.6 h. p.r.n. 4. Ibuprofen 200 mg 2-3 tablets p.o. q.4 h. P.r.n. 5. Procardia XL 90 mg p.o. daily. 6. Extra Strength Tylenol 500 mg 1-2 tablets p.o. daily p.r.n.  The patient was initially to be discharged on February 05, 2011 after her diet was advanced.  The patient was placed on a soft bland diet, however, the patient continued to be nauseous with a soft bland diet and as such her diet was decreased back to a full liquid diet.  The patient's nausea improved.  The patient was continued on a full liquid diet with 1 episode of nausea the night prior to admission.  The patient tolerated her full liquid diet and the patient will be discharged home on a full liquid diet for a few days and to advance as tolerated to a soft bland diet.  The patient is to follow up with PCP within the next week for hospital followup.  The patient will be discharged in stable and improved condition.  For the rest of the hospitalization, please refer to prior discharge summary dictated per Dr. Jerral Ralph of job [phone].  On followup with PCP, the patient will  need a BMET checked to follow up on his electrolytes.  DISCHARGE VITAL SIGNS:  Temperature 98.2, pulse of 68, respirations 19, blood pressure 107/60, satting 97% on room air.  DISCHARGE LABS:  Sodium 136, potassium 3.5, chloride 98, bicarb 27, glucose 107, BUN 7, creatinine 0.58, calcium of 9.1.  It has been a pleasure taking care of [name].     Ramiro Harvest, MD     DT/MEDQ  D:  02/07/2011  T:  02/07/2011  Job:  [id number]  cc:   Tera Mater. Clent Ridges, MD  Electronically Signed by Ramiro Harvest MD on 02/24/2011 12:29:48 PM

## 2011-03-05 NOTE — H&P (Signed)
NAMEMILDRETH, REEK              ACCOUNT NO.:  [id number]  MEDICAL RECORD NO.:  [id number]  LOCATION:  [facility]                         FACILITY:  [facility]  PHYSICIAN:  Lonia Blood, M.D.      DATE OF BIRTH:  [birth year]  DATE OF ADMISSION:  02/03/2011 DATE OF DISCHARGE:                             HISTORY & PHYSICAL   PRIMARY CARE PHYSICIAN:  Tera Mater. Clent Ridges, MD  PRESENTING COMPLAINT:  Abdominal pain, nausea, vomiting, diarrhea.  HISTORY OF PRESENT ILLNESS:  The patient is a 55 year old female with history of hypertension who was apparently at work 2 days ago, on Tuesday, started experiencing midabdominal pain associated with some nausea.  She had 1 episode of diarrhea followed by continuous vomiting. When she had the diarrhea, it was mainly watery.  This has alternated with constipation.  The pain persisted and it was as high as 8/10, but currently down to light 4/10.  She has continued nausea, vomiting, not able to keep much down.  She denied any known sick contacts.  No fever or chills.  No hematemesis no melena, no bright red blood per rectum. The patient went to [facility] where she had blood work done and urine tests as well as x-ray, and she was asked to come to the emergency room for further management.  PAST MEDICAL HISTORY:  Mainly hypertension, also history of brain aneurysm status post plates inserted by Dr. Newell Coral over 14 years ago.  ALLERGIES:  No known drug allergies.  MEDICATION:  Procardia 1 tablet daily.  SOCIAL HISTORY:  She is married, lives here in [place].  No tobacco, alcohol, or IV drug use.  She is gainfully employed and active.  FAMILY HISTORY:  Father died from brain aneurysm in his 84s.  Otherwise, hypertension runs in her family.  REVIEW OF SYSTEMS:  All systems reviewed are negative except per HPI.  PHYSICAL EXAMINATION:  VITAL SIGNS:  Temperature 98.3, blood pressure 149/77 with a pulse 101, respiratory rate 16, sats 97% on  room air. GENERAL:  She is awake, alert, oriented, pleasant woman was in no acute distress. HEENT:  PERRL.  EOMI.  No pallor, no jaundice, no rhinorrhea. NECK:  Supple.  No JVD, no lymphadenopathy. RESPIRATORY:  She has good air entry bilaterally.  No wheezes, no rales, no crackles.  CARDIOVASCULAR SYSTEM:  She has S1 and S2.  No audible murmur. ABDOMEN:  Soft, full with some diffuse discomfort, but no frank tenderness.  No organomegaly. EXTREMITIES:  No edema, cyanosis, or clubbing. SKIN:  No rashes or ulcers.  LABORATORY DATA:  White count 2.6 with left shift, ANC 11.1, her hemoglobin 14.7, platelet 261.  Sodium 138, potassium 2.9, chloride 94, CO2 33, glucose 108, BUN 18, creatinine 0.69.  Urinalysis showed cloudy urine with some ketones, proteinuria, small leukocyte esterase.  Urine wbc's 11-20 with many epithelial cells, bacteria few.  Her LFTs are within normal.  Lipase is 18.  Acute abdominal series shows small bowel dilatation in an obstructive pattern.  CT abdomen and pelvis with contrast medium showed thickenedsegment of mid ileum noted within the upper pelvis which measures up 7 cm in length.  There is a relatively minimal narrowing, raising question for  an infectious or inflammatory process, but no definite mass.  There was small amount of free fluid in the pelvis, also diffuse distention of small bowel loops to 4.1 cm in maximal diameter, but no focal transition point.  No evidence of obstruction.  She had mild scattered calcification along the abdominal aorta and its branches and mild degenerative change along the lumbar spine.  ASSESSMENT:  This is a 55 year old female presenting with abdominal pain and evidence of some type of enteritis.  Differential here includes infectious like some type of enteritis versus inflammatory causes such as Crohn disease.  PLAN: 1. Abdominal pain and enteritis.  Admit the patient for bowel rest,     pain control.  I will start her on  empiric Cipro, Flagyl for     infectious causes.  The patient will need to be seen by GI at some     point.  If her symptoms improve tremendously without any     recurrence, then maybe the patient's symptoms are entirely     infectious in nature.  Otherwise, this may reflect Crohn disease     and she may need to be on treatment by GI. 2. Hypertension.  Blood pressure is reasonable.  Once her nausea and     vomiting subsides, we may resume her Procardia.  In the meantime,     we can use IV beta blockers if needed to control her blood     pressure. 3. Hypokalemia.  Replete her potassium IV. 4. Possible urinary tract infection.  Based on urinalysis.  She will     be on Cipro, Flagyl.  Hopefully, Cipro will be able to cover the     UTI even with a known resistance in the area. 5. Dehydration.  We will hydrate her aggressively.     Lonia Blood, M.D.     Verlin Grills  D:  02/04/2011  T:  02/04/2011  Job:  [id number]  Electronically Signed by Lonia Blood M.D. on 03/05/2011 03:11:32 PM

## 2011-03-09 ENCOUNTER — Other Ambulatory Visit: Payer: Self-pay | Admitting: Family Medicine

## 2011-03-10 NOTE — Telephone Encounter (Signed)
Pt should have refills left.  

## 2011-07-08 ENCOUNTER — Other Ambulatory Visit: Payer: Self-pay | Admitting: Family Medicine

## 2011-07-08 DIAGNOSIS — Z1231 Encounter for screening mammogram for malignant neoplasm of breast: Secondary | ICD-10-CM

## 2011-07-18 ENCOUNTER — Ambulatory Visit
Admission: RE | Admit: 2011-07-18 | Discharge: 2011-07-18 | Disposition: A | Discharge status: Home / Self Care | Payer: BC Managed Care – PPO | Source: Ambulatory Visit | Attending: Family Medicine | Admitting: Family Medicine

## 2011-07-18 DIAGNOSIS — Z1231 Encounter for screening mammogram for malignant neoplasm of breast: Secondary | ICD-10-CM

## 2011-08-23 ENCOUNTER — Telehealth: Payer: Self-pay | Admitting: Family Medicine

## 2011-08-23 NOTE — Telephone Encounter (Signed)
Refill request for Zolpidem Tartrate 10 mg take 1 po qhs prn and pt last here on 01/10/11.

## 2011-08-23 NOTE — Telephone Encounter (Signed)
Refill 30 # x 1 further refills per Dr Clent Ridges

## 2011-08-24 MED ORDER — ZOLPIDEM TARTRATE 10 MG PO TABS: 10.0000 mg | ORAL_TABLET | Freq: Every evening | ORAL | Status: DC | PRN

## 2011-08-24 NOTE — Telephone Encounter (Signed)
Script called in

## 2012-02-03 ENCOUNTER — Other Ambulatory Visit: Payer: Self-pay | Admitting: Family Medicine

## 2012-02-03 ENCOUNTER — Telehealth: Payer: Self-pay | Admitting: Family Medicine

## 2012-02-03 NOTE — Telephone Encounter (Signed)
I sent script e-scribe. 

## 2012-02-03 NOTE — Telephone Encounter (Signed)
Patient called stating that she need a refill of her nifedipine appt is already scheduled. Pharmacy is [facility]. Please assist.

## 2012-02-03 NOTE — Telephone Encounter (Signed)
Pt called to check on status of getting bp med called in to [facility] today. Pt only has 2 pills left. Pt has schd ov for Oct.

## 2012-02-10 ENCOUNTER — Encounter: Payer: Self-pay | Admitting: Family Medicine

## 2012-02-10 ENCOUNTER — Ambulatory Visit (INDEPENDENT_AMBULATORY_CARE_PROVIDER_SITE_OTHER): Payer: BC Managed Care – PPO | Admitting: Family Medicine

## 2012-02-10 VITALS — BP 128/82 | Temp 98.4°F | Wt 173.0 lb

## 2012-02-10 DIAGNOSIS — L0291 Cutaneous abscess, unspecified: Secondary | ICD-10-CM

## 2012-02-10 DIAGNOSIS — L0390 Cellulitis, unspecified: Secondary | ICD-10-CM

## 2012-02-10 MED ORDER — AMOXICILLIN 875 MG PO TABS: 875.0000 mg | ORAL_TABLET | Freq: Two times a day (BID) | ORAL | Status: DC

## 2012-02-10 MED ORDER — DOXYCYCLINE HYCLATE 100 MG PO TABS: 100.0000 mg | ORAL_TABLET | Freq: Two times a day (BID) | ORAL | Status: DC

## 2012-02-10 NOTE — Patient Instructions (Signed)
--  As we discussed, we have prescribed a new medication for you at this appointment. We discussed the common and serious potential adverse effects of this medication and you can review these and more with the pharmacist when you pick up your medication.  Please follow the instructions for use carefully and notify us immediately if you have any problems taking this medication.  -warm compresses a few times per day  -tylenol or ibuprofen as needed for pain  - please go to [facility] or the ED over the weekend if worsening redness and pain, fevers or chills or feeling unwell  -follow up in 3-4 days with me  Thank you for enrolling in MyChart. Please follow the instructions below to securely access your online medical record. MyChart allows you to send messages to your doctor, view your test results, renew your prescriptions, schedule appointments, and more.  How Do I Sign Up? 1. In your Internet browser, go to [email or web address] 2. Click on the New  User? link in the Sign In box.  3. Enter your MyChart Access Code exactly as it appears below. You will not need to use this code after you have completed the sign-up process. If you do not sign up before the expiration date, you must request a new code. MyChart Access Code: [email or web address] Expires: 03/11/2012  8:24 AM  4. Enter the last four digits of your Social Security Number (xxxx) and Date of Birth (mm/dd/yyyy) as indicated and click Next. You will be taken to the next sign-up page. 5. Create a MyChart ID. This will be your MyChart login ID and cannot be changed, so think of one that is secure and easy to remember. 6. Create a MyChart password. You can change your password at any time. 7. Enter your Password Reset Question and Answer and click Next. This can be used at a later time if you forget your password.  8. Select your communication preference, and if applicable enter your e-mail address. You will receive e-mail  notification when new information is available in MyChart by choosing to receive e-mail notifications and filling in your e-mail. 9. Click Sign In. You can now view your medical record.   Additional Information If you have questions, you can email [email or web address] or call [phone] to talk to our MyChart staff. Remember, MyChart is NOT to be used for urgent needs. For medical emergencies, dial 911.

## 2012-02-10 NOTE — Progress Notes (Signed)
Chief Complaint  Patient presents with  . boil on stomach    tender and red     HPI: Skin issues: -had small pimple like lesion with small head for a few months (unsure of start date) on abdomen and could sometimes pop it -this lesion enlarged and became suddenly tender and red a few days ago -denies fevers, chills, malaise, nausea, vomiting or other lesions -no insect bite -no known mrsa exposure -no allergies to abx  ROS: See pertinent positives and negatives per HPI.  Past Medical History  Diagnosis Date  . Hypertension   . Headache   . Insomnia   . Insomnia     Family History  Problem Relation Age of Onset  . Hypertension    . Sudden death      History   Social History  . Marital Status: Single    Spouse Name: N/A    Number of Children: N/A  . Years of Education: N/A   Social History Main Topics  . Smoking status: Never Smoker   . Smokeless tobacco: Never Used  . Alcohol Use: Yes  . Drug Use: No  . Sexually Active: None   Other Topics Concern  . None   Social History Narrative  . None    Current outpatient prescriptions:NIFEdipine (PROCARDIA XL/ADALAT-CC) 90 MG 24 hr tablet, TAKE 1 TABLET BY MOUTH EVERY DAY, Disp: 30 tablet, Rfl: 0;  potassium chloride SA (K-DUR,KLOR-CON) 20 MEQ tablet, Take 1 tablet (20 mEq total) by mouth daily., Disp: 30 tablet, Rfl: 11;  amoxicillin (AMOXIL) 875 MG tablet, Take 1 tablet (875 mg total) by mouth 2 (two) times daily., Disp: 20 tablet, Rfl: 0 doxycycline (VIBRA-TABS) 100 MG tablet, Take 1 tablet (100 mg total) by mouth 2 (two) times daily., Disp: 20 tablet, Rfl: 0;  zolpidem (AMBIEN) 10 MG tablet, Take 1 tablet (10 mg total) by mouth at bedtime as needed for sleep., Disp: 30 tablet, Rfl: 5;  zolpidem (AMBIEN) 10 MG tablet, Take 1 tablet (10 mg total) by mouth at bedtime as needed for sleep., Disp: 30 tablet, Rfl: 1  EXAM:  Filed Vitals:   02/10/12 0803  BP: 128/82  Temp: 98.4 F (36.9 C)    There is no height on  file to calculate BMI.  GENERAL: vitals reviewed and listed below, alert, oriented, appears well hydrated and in no acute distress  HEENT: atraumatic, conjucntiva clear, no obvious abnormalities on inspection of external nose and ears  SKIN: 6cm in diameter red and superficially indurated area of tissue with small central punctate Left upper abdomen - no fluctuant area or abscess noted at this time  MS: moves all extremities without noticeable abnormality  PSYCH: pleasant and cooperative, no obvious depression or anxiety  ASSESSMENT AND PLAN:  Discussed the following assessment and plan:  1. Cellulitis    -Per exam appear inflammation is c/w cellulitis at this time. Discussed possibility of forming abscess, but no noted fluctuance at this time. -tx with coverage for mrsa and strep organisms - discussed tx options and common and serious adverse effects of these medications -further tx recs per patient instructions below - Discussed potential for cellulitis to become amore serious systemic infection and provided strict ED/Urgen care/return precuations -update tetanus next visit -return for recheck in 3-4 days (monday)  No orders of the defined types were placed in this encounter.    Patient Instructions  --As we discussed, we have prescribed a new medication for you at this appointment. We discussed the common and  serious potential adverse effects of this medication and you can review these and more with the pharmacist when you pick up your medication.  Please follow the instructions for use carefully and notify us immediately if you have any problems taking this medication.  -warm compresses a few times per day  -tylenol or ibuprofen as needed for pain  - please go to [facility] or the ED over the weekend if worsening redness and pain, fevers or chills or feeling unwell  -follow up in 3-4 days with me  Thank you for enrolling in MyChart. Please follow the instructions below to  securely access your online medical record. MyChart allows you to send messages to your doctor, view your test results, renew your prescriptions, schedule appointments, and more.  How Do I Sign Up? 1. In your Internet browser, go to [email or web address] 2. Click on the New  User? link in the Sign In box.  3. Enter your MyChart Access Code exactly as it appears below. You will not need to use this code after you have completed the sign-up process. If you do not sign up before the expiration date, you must request a new code. MyChart Access Code: [email or web address] Expires: 03/11/2012  8:24 AM  4. Enter the last four digits of your Social Security Number (xxxx) and Date of Birth (mm/dd/yyyy) as indicated and click Next. You will be taken to the next sign-up page. 5. Create a MyChart ID. This will be your MyChart login ID and cannot be changed, so think of one that is secure and easy to remember. 6. Create a MyChart password. You can change your password at any time. 7. Enter your Password Reset Question and Answer and click Next. This can be used at a later time if you forget your password.  8. Select your communication preference, and if applicable enter your e-mail address. You will receive e-mail notification when new information is available in MyChart by choosing to receive e-mail notifications and filling in your e-mail. 9. Click Sign In. You can now view your medical record.   Additional Information If you have questions, you can email [email or web address] or call [phone] to talk to our MyChart staff. Remember, MyChart is NOT to be used for urgent needs. For medical emergencies, dial 911.     Return to clinic immediately if symptoms worsen or persist or new concerns.  No Follow-up on file.  Kriste Basque R.

## 2012-02-13 ENCOUNTER — Ambulatory Visit (INDEPENDENT_AMBULATORY_CARE_PROVIDER_SITE_OTHER): Payer: BC Managed Care – PPO | Admitting: Family Medicine

## 2012-02-13 ENCOUNTER — Telehealth: Payer: Self-pay | Admitting: Family Medicine

## 2012-02-13 ENCOUNTER — Ambulatory Visit: Payer: BC Managed Care – PPO | Admitting: Family Medicine

## 2012-02-13 ENCOUNTER — Encounter: Payer: Self-pay | Admitting: Family Medicine

## 2012-02-13 VITALS — BP 150/80 | Temp 98.7°F | Wt 176.0 lb

## 2012-02-13 DIAGNOSIS — L0390 Cellulitis, unspecified: Secondary | ICD-10-CM

## 2012-02-13 DIAGNOSIS — L0291 Cutaneous abscess, unspecified: Secondary | ICD-10-CM

## 2012-02-13 DIAGNOSIS — Z23 Encounter for immunization: Secondary | ICD-10-CM

## 2012-02-13 NOTE — Telephone Encounter (Signed)
Caller: [name]/Patient; Patient Name: [name]; PCP: ; Janith Lima Phone Number: [phone]; Reason for call: Caller states she was seen in clinic 02/10/12 and diagnosed with cellulitis to  left upper quadrant of abdomen by Dr. Kriste Basque. MD prescribed Amoxicillen 875mg   twice daily and Doxycylcine 100mg  twice daily.  Meds started on Friday/ 09/20 by patient.  [name] has also treated area with warm compress and heating pad.  Denies fever. Caller states areas started with two little white spots and now the spots has become one large 50 cent white area and bursted opne wihtin the last 30 minutes and is draining yellow fluid. . Pain has become worse now that area is open.. Pain currently rated as a 5 with Ibuprofen and as a 10 with touch.  Caller was told by Dr. Selena Batten to follow up on either Mon/ 09/23 or Tues. 09/24.  Caller scheduled an appointment with Dr. Selena Batten for Tues., 09/24 at 2:00pm.  All emergent symptoms ruled out per Skin Lesion guideline with exception " any skin lesion with signs and symptoms or worsening infection , becoming more painful , purulent drainage , new fever not improving with treatment".Only available appointment with Dr. Homero Fellers at 3:45pm.  PATIENT NEEDS TO BE SEEN FOR AN APPOINTMENT WITH IN THE NEXT 4 HOURS. PATIENT SAID DR. Selena Batten REALLY WANTED TO SEE HER FOR FOLLOW UP AND IS REQUESTING DR. Lesli Albee BE NOTIFIED OF CHANGES TO AREA TO SEE IF SHE CAN BE WORKED IN WITH MD. PATIENT CAN BE REACHED [phone].

## 2012-02-13 NOTE — Patient Instructions (Addendum)
-  keep area clean and dry   -if packing falls out that is ok - otherwise remove 1/2 inch per day  -follow up in 2-3 days or sooner if worsening redness, fevers, chills or other concerns  -continue antibiotics  -We have ordered labs for your at this visit. It usually takes 1-2 weeks for these to result and be processed. We will contact you with instructions if your results are abnormal. Normal results will be release to your [facility] in 1-2 weeks. If you have not heard from [place] or can not find your results in [facility] in 2 weeks please contact our office.

## 2012-02-13 NOTE — Telephone Encounter (Signed)
Pls advise.  

## 2012-02-13 NOTE — Telephone Encounter (Signed)
Please work her in at 4:15. Thanks.  Dr. Selena Batten

## 2012-02-13 NOTE — Progress Notes (Addendum)
Chief Complaint  Patient presents with  . 3 to 4 day check up    HPI:   Here for follow up cellulitis: -seen 4 days ago with cellulitis on abdomen with no area to drain on exam at that time -rec for abx and compresses last visit  -has been taking abx and applying compresses and over weekend developed  swelling centrally with decreased redness peripherally -painful and had white drainage today  -no fevers, malaise, chills, GI symptoms  ROS: See pertinent positives and negatives per HPI.  Past Medical History  Diagnosis Date  . Hypertension   . Headache   . Insomnia   . Insomnia     Family History  Problem Relation Age of Onset  . Hypertension    . Sudden death      History   Social History  . Marital Status: Single    Spouse Name: N/A    Number of Children: N/A  . Years of Education: N/A   Social History Main Topics  . Smoking status: Never Smoker   . Smokeless tobacco: Never Used  . Alcohol Use: Yes  . Drug Use: No  . Sexually Active: None   Other Topics Concern  . None   Social History Narrative  . None    Current outpatient prescriptions:amoxicillin (AMOXIL) 875 MG tablet, Take 1 tablet (875 mg total) by mouth 2 (two) times daily., Disp: 20 tablet, Rfl: 0;  doxycycline (VIBRA-TABS) 100 MG tablet, Take 1 tablet (100 mg total) by mouth 2 (two) times daily., Disp: 20 tablet, Rfl: 0;  NIFEdipine (PROCARDIA XL/ADALAT-CC) 90 MG 24 hr tablet, TAKE 1 TABLET BY MOUTH EVERY DAY, Disp: 30 tablet, Rfl: 0 potassium chloride SA (K-DUR,KLOR-CON) 20 MEQ tablet, Take 1 tablet (20 mEq total) by mouth daily., Disp: 30 tablet, Rfl: 11;  zolpidem (AMBIEN) 10 MG tablet, Take 10 mg by mouth at bedtime as needed., Disp: , Rfl: ;  zolpidem (AMBIEN) 10 MG tablet, Take 10 mg by mouth at bedtime as needed., Disp: , Rfl: ;  DISCONTD: zolpidem (AMBIEN) 10 MG tablet, Take 1 tablet (10 mg total) by mouth at bedtime as needed for sleep., Disp: 30 tablet, Rfl: 5 DISCONTD: zolpidem (AMBIEN) 10  MG tablet, Take 1 tablet (10 mg total) by mouth at bedtime as needed for sleep., Disp: 30 tablet, Rfl: 1  EXAM:  Filed Vitals:   02/13/12 1627  BP: 150/80  Temp: 98.7 F (37.1 C)    There is no height on file to calculate BMI.  GENERAL: vitals reviewed and listed below, alert, oriented, appears well hydrated and in no acute distress  SKIN: 4-5cm area of indurated erythematous skin L upper abdomen with central 2 cm in diameter area of fluctuance and tenderness.  PSYCH: pleasant and cooperative, no obvious depression or anxiety  ASSESSMENT AND PLAN:  Discussed the following assessment and plan:  1. Cellulitis    2. Abscess  I and D, Wound culture   -pt now with appreciable abscess - cellulitis seems to be improving -abcess I and Dd and culture sent to lab -continue abx given surrounding cellulitis -pt informed that cyst may have been present initially as small fragments of wall expressed during procedure. Warned that may continue to drain while healing and can recur. Smaller then desirable (1cm) incision made due to cosmetic concerns. -tetanus out of date - given today -follow up in 2-3 days week or sooner if not improving  Procedure - Abscess I and D: Informed written consent performed and signed  consent scanned into chart. Area cleaned with betadine and 2% lidocaine with epi injected for local anesthetic. Incised with 11 blade. Copious amounts of purulent material expressed and culture sent. Cavity explored with sterile hemostats with removal of loculations. Flushed with saline and sterile packing wick placed. Care and return instructions provided.     Orders Placed This Encounter  Procedures  . Wound culture    There are no Patient Instructions on file for this visit.  Return to clinic immediately if symptoms worsen or persist or new concerns.  No Follow-up on file.  Kriste Basque R.  08/14/11: Called to see how patient is doing. Baruch Goldmann, Dahlia Client R.

## 2012-02-13 NOTE — Telephone Encounter (Signed)
Pt aware of apt at 4:15 with Dr. Selena Batten

## 2012-02-14 ENCOUNTER — Telehealth: Payer: Self-pay | Admitting: Family Medicine

## 2012-02-14 ENCOUNTER — Ambulatory Visit: Payer: BC Managed Care – PPO | Admitting: Family Medicine

## 2012-02-14 NOTE — Telephone Encounter (Signed)
Pt is calling back to let MD know she is fine. Pt was seen yesterday

## 2012-02-16 ENCOUNTER — Encounter: Payer: Self-pay | Admitting: Family Medicine

## 2012-02-16 ENCOUNTER — Ambulatory Visit (INDEPENDENT_AMBULATORY_CARE_PROVIDER_SITE_OTHER): Payer: BC Managed Care – PPO | Admitting: Family Medicine

## 2012-02-16 VITALS — BP 120/82 | Temp 98.6°F | Wt 175.0 lb

## 2012-02-16 DIAGNOSIS — L723 Sebaceous cyst: Secondary | ICD-10-CM

## 2012-02-16 DIAGNOSIS — L089 Local infection of the skin and subcutaneous tissue, unspecified: Secondary | ICD-10-CM

## 2012-02-16 DIAGNOSIS — L0390 Cellulitis, unspecified: Secondary | ICD-10-CM

## 2012-02-16 DIAGNOSIS — L0291 Cutaneous abscess, unspecified: Secondary | ICD-10-CM

## 2012-02-16 LAB — WOUND CULTURE
Gram Stain: NONE SEEN
Organism ID, Bacteria: NO GROWTH

## 2012-02-16 NOTE — Progress Notes (Signed)
Chief Complaint  Patient presents with  . Follow-up    HPI:  Here for follow up after I and D likely infected or inflamed sebaceous cyst with cellutis -much improved -less tender -less erythema -some drainage -wick still in -tolerating abx well  ROS: See pertinent positives and negatives per HPI.  Past Medical History  Diagnosis Date  . Hypertension   . Headache   . Insomnia   . Insomnia     Family History  Problem Relation Age of Onset  . Hypertension    . Sudden death      History   Social History  . Marital Status: Single    Spouse Name: N/A    Number of Children: N/A  . Years of Education: N/A   Social History Main Topics  . Smoking status: Never Smoker   . Smokeless tobacco: Never Used  . Alcohol Use: Yes  . Drug Use: No  . Sexually Active: None   Other Topics Concern  . None   Social History Narrative  . None    Current outpatient prescriptions:amoxicillin (AMOXIL) 875 MG tablet, Take 1 tablet (875 mg total) by mouth 2 (two) times daily., Disp: 20 tablet, Rfl: 0;  doxycycline (VIBRA-TABS) 100 MG tablet, Take 1 tablet (100 mg total) by mouth 2 (two) times daily., Disp: 20 tablet, Rfl: 0;  NIFEdipine (PROCARDIA XL/ADALAT-CC) 90 MG 24 hr tablet, TAKE 1 TABLET BY MOUTH EVERY DAY, Disp: 30 tablet, Rfl: 0 potassium chloride SA (K-DUR,KLOR-CON) 20 MEQ tablet, Take 1 tablet (20 mEq total) by mouth daily., Disp: 30 tablet, Rfl: 11;  zolpidem (AMBIEN) 10 MG tablet, Take 10 mg by mouth at bedtime as needed., Disp: , Rfl: ;  zolpidem (AMBIEN) 10 MG tablet, Take 10 mg by mouth at bedtime as needed., Disp: , Rfl:   EXAM:  Filed Vitals:   02/16/12 1550  BP: 120/82  Temp: 98.6 F (37 C)    There is no height on file to calculate BMI.  GENERAL: vitals reviewed and listed below, alert, oriented, appears well hydrated and in no acute distress  SK: much improved/decreased erythema of skin surrounding site of I and D - now with only a small amount of induration  and erythema in 2 cm diameter around incision site, incision line has closed some and some white material is protruding from the incision  ASSESSMENT AND PLAN:  Discussed the following assessment and plan:  1. Infected sebaceous cyst   2. Cellulitis and abscess    -white material expressed is c/w cyst wall material -suspect this was an infected sebaceous cyst given and that given degree of cellulitis on initial presentation wall was ruptured/degraded and fragments remain -consent obtained and 2% lidocaine given for local anesthetic, incision site widened to 2 cm, cavity explored with hemostats with small and medium sized fragments of cyst wall removed, cavity flushed with copious amounts of saline until run off clear. Wick placed. Care instructions provided. Follow up in 5-7 days. No orders of the defined types were placed in this encounter.    Patient Instructions  -keep area clean and dry  -pull out packing about 1/2 inch daily  -if packing comes out have mother wipe inside cavity daily with long Q-tips provided  -continue antibiotics  -follow up in 5-7 days    Return to clinic immediately if symptoms worsen or persist or new concerns.  Return in about 5 days (around 02/21/2012) for cellulitis, infected sebaceous cyst.  Kriste Basque R.

## 2012-02-16 NOTE — Patient Instructions (Signed)
-  keep area clean and dry  -pull out packing about 1/2 inch daily  -if packing comes out have mother wipe inside cavity daily with long Q-tips provided  -continue antibiotics  -follow up in 5-7 days

## 2012-02-23 ENCOUNTER — Ambulatory Visit: Payer: BC Managed Care – PPO | Admitting: Family Medicine

## 2012-02-23 VITALS — BP 110/80 | Temp 98.8°F | Wt 175.0 lb

## 2012-02-23 DIAGNOSIS — L0390 Cellulitis, unspecified: Secondary | ICD-10-CM

## 2012-02-23 NOTE — Patient Instructions (Addendum)
1. Cellulitis   -healing well -advise normal bathing and allow shower water to run over incision -swab daily with sterile qtip until healed, massage with antibacterial ointment daily  -follow up at physical exam or sooner if does not continue to improve or developes swelling or redness or discharge

## 2012-02-23 NOTE — Progress Notes (Addendum)
Chief Complaint  Patient presents with  . 5 to 7 day f/u    HPI:   Here for follow up for infected sebaceous cyst with cellulitis: -status post I and D and abx -doing well - resolving pain and erythema -no further drainage -packing fell out and removed at home 2 days ago -has finished course of abx  ROS: See pertinent positives and negatives per HPI.  Past Medical History  Diagnosis Date  . Hypertension   . Headache   . Insomnia   . Insomnia     Family History  Problem Relation Age of Onset  . Hypertension    . Sudden death      History   Social History  . Marital Status: Single    Spouse Name: N/A    Number of Children: N/A  . Years of Education: N/A   Social History Main Topics  . Smoking status: Never Smoker   . Smokeless tobacco: Never Used  . Alcohol Use: Yes  . Drug Use: No  . Sexually Active: Not on file   Other Topics Concern  . Not on file   Social History Narrative  . No narrative on file    Current outpatient prescriptions:NIFEdipine (PROCARDIA XL/ADALAT-CC) 90 MG 24 hr tablet, TAKE 1 TABLET BY MOUTH EVERY DAY, Disp: 30 tablet, Rfl: 0;  potassium chloride SA (K-DUR,KLOR-CON) 20 MEQ tablet, Take 1 tablet (20 mEq total) by mouth daily., Disp: 30 tablet, Rfl: 11;  zolpidem (AMBIEN) 10 MG tablet, Take 10 mg by mouth at bedtime as needed., Disp: , Rfl:  zolpidem (AMBIEN) 10 MG tablet, Take 10 mg by mouth at bedtime as needed., Disp: , Rfl: ;  amoxicillin (AMOXIL) 875 MG tablet, Take 1 tablet (875 mg total) by mouth 2 (two) times daily., Disp: 20 tablet, Rfl: 0;  doxycycline (VIBRA-TABS) 100 MG tablet, Take 1 tablet (100 mg total) by mouth 2 (two) times daily., Disp: 20 tablet, Rfl: 0  EXAM:  Filed Vitals:   02/23/12 1511  BP: 110/80  Temp: 98.8 F (37.1 C)    There is no height on file to calculate BMI.  GENERAL: vitals reviewed and listed below, alert, oriented, appears well hydrated and in no acute distress  SKIN: greatly improved  appearance of skin surrounding small 1.5 cm incision on abdomen, no pus from wound, no signs of remaining cellulitis, some surrounding scar tissue  PSYCH: pleasant and cooperative, no obvious depression or anxiety  ASSESSMENT AND PLAN:  Discussed the following assessment and plan:  1. Cellulitis   -healing well -advised that with infected cyst can have recurrence in the future -follow up at CPE in a few weeks or sooner if any concerns  No orders of the defined types were placed in this encounter.    Patient Instructions   1. Cellulitis   -healing well -advise normal bathing and allow shower water to run over incision -swab daily with sterile qtip until healed, massage with antibacterial ointment daily  -follow up at physical exam or sooner if does not continue to improve or developes swelling or redness or discharge    Return to clinic immediately if symptoms worsen or persist or new concerns.  No Follow-up on file.  Kriste Basque R.

## 2012-03-04 ENCOUNTER — Other Ambulatory Visit: Payer: Self-pay | Admitting: Family Medicine

## 2012-03-07 ENCOUNTER — Other Ambulatory Visit (INDEPENDENT_AMBULATORY_CARE_PROVIDER_SITE_OTHER): Payer: BC Managed Care – PPO

## 2012-03-07 DIAGNOSIS — Z0000 Encounter for general adult medical examination without abnormal findings: Secondary | ICD-10-CM

## 2012-03-07 LAB — POCT URINALYSIS DIPSTICK
Blood, UA: NEGATIVE
Glucose, UA: NEGATIVE
Nitrite, UA: NEGATIVE
Spec Grav, UA: 1.02
Urobilinogen, UA: 0.2
pH, UA: 6.5

## 2012-03-07 LAB — HEPATIC FUNCTION PANEL
ALT: 16 U/L (ref 0–35)
AST: 18 U/L (ref 0–37)
Albumin: 4 g/dL (ref 3.5–5.2)
Alkaline Phosphatase: 80 U/L (ref 39–117)

## 2012-03-07 LAB — LIPID PANEL
HDL: 35.6 mg/dL — ABNORMAL LOW (ref >39.00)
LDL Cholesterol: 68 mg/dL (ref 0–99)
Total CHOL/HDL Ratio: 3
Triglycerides: 84 mg/dL (ref 0.0–149.0)

## 2012-03-07 LAB — TSH: TSH: 1.08 u[IU]/mL (ref 0.35–5.50)

## 2012-03-07 LAB — CBC WITH DIFFERENTIAL/PLATELET
Basophils Absolute: 0 10*3/uL (ref 0.0–0.1)
Basophils Relative: 0.6 % (ref 0.0–3.0)
Eosinophils Relative: 4.9 % (ref 0.0–5.0)
HCT: 39.8 % (ref 36.0–46.0)
Hemoglobin: 13.1 g/dL (ref 12.0–15.0)
Lymphocytes Relative: 28.7 % (ref 12.0–46.0)
Lymphs Abs: 1.3 10*3/uL (ref 0.7–4.0)
Monocytes Relative: 13.9 % — ABNORMAL HIGH (ref 3.0–12.0)
Neutro Abs: 2.4 10*3/uL (ref 1.4–7.7)
RBC: 4.49 Mil/uL (ref 3.87–5.11)
RDW: 13.2 % (ref 11.5–14.6)
WBC: 4.7 10*3/uL (ref 4.5–10.5)

## 2012-03-07 LAB — BASIC METABOLIC PANEL
Calcium: 9.1 mg/dL (ref 8.4–10.5)
GFR: 86.43 mL/min (ref >60.00)
Glucose, Bld: 91 mg/dL (ref 70–99)
Potassium: 4 mEq/L (ref 3.5–5.1)
Sodium: 141 mEq/L (ref 135–145)

## 2012-03-09 NOTE — Progress Notes (Signed)
Quick Note:  I spoke with pt ______ 

## 2012-03-13 ENCOUNTER — Ambulatory Visit (INDEPENDENT_AMBULATORY_CARE_PROVIDER_SITE_OTHER): Payer: BC Managed Care – PPO | Admitting: Family Medicine

## 2012-03-13 ENCOUNTER — Encounter: Payer: Self-pay | Admitting: Family Medicine

## 2012-03-13 VITALS — BP 110/70 | HR 110 | Temp 98.2°F | Ht 68.0 in | Wt 175.0 lb

## 2012-03-13 DIAGNOSIS — Z0000 Encounter for general adult medical examination without abnormal findings: Secondary | ICD-10-CM

## 2012-03-13 MED ORDER — ZOLPIDEM TARTRATE 10 MG PO TABS: 10.0000 mg | ORAL_TABLET | Freq: Every evening | ORAL | Status: DC | PRN

## 2012-03-13 NOTE — Progress Notes (Signed)
  Subjective:    Patient ID: [name], female    DOB: Jun 28, 1955, 56 y.o.   MRN: [id number]  HPI 56 yr female for a cpx. She feels fine and has no concerns. She was recently treated for a boil on the abdomen with a surrounding cellulitis. This was lanced twice, and she was treated with Doxycycline and Amoxicillin. The culture showed no growth. She responded well and now feels fine.    Review of Systems  Constitutional: Negative.   HENT: Negative.   Eyes: Negative.   Respiratory: Negative.   Cardiovascular: Negative.   Gastrointestinal: Negative.   Genitourinary: Negative for dysuria, urgency, frequency, hematuria, flank pain, decreased urine volume, enuresis, difficulty urinating, pelvic pain and dyspareunia.  Musculoskeletal: Negative.   Skin: Negative.   Neurological: Negative.   Hematological: Negative.   Psychiatric/Behavioral: Negative.        Objective:   Physical Exam  Constitutional: She is oriented to person, place, and time. She appears well-developed and well-nourished. No distress.  HENT:  Head: Normocephalic and atraumatic.  Right Ear: External ear normal.  Left Ear: External ear normal.  Nose: Nose normal.  Mouth/Throat: Oropharynx is clear and moist. No oropharyngeal exudate.  Eyes: Conjunctivae normal and EOM are normal. Pupils are equal, round, and reactive to light. No scleral icterus.  Neck: Normal range of motion. Neck supple. No JVD present. No thyromegaly present.  Cardiovascular: Normal rate, regular rhythm, normal heart sounds and intact distal pulses.  Exam reveals no gallop and no friction rub.   No murmur heard. Pulmonary/Chest: Effort normal and breath sounds normal. No respiratory distress. She has no wheezes. She has no rales. She exhibits no tenderness.  Abdominal: Soft. Bowel sounds are normal. She exhibits no distension and no mass. There is no tenderness. There is no rebound and no guarding.       The site of the boil has closed and the  area is not tender and not erythematous   Musculoskeletal: Normal range of motion. She exhibits no edema and no tenderness.  Lymphadenopathy:    She has no cervical adenopathy.  Neurological: She is alert and oriented to person, place, and time. She has normal reflexes. No cranial nerve deficit. She exhibits normal muscle tone. Coordination normal.  Skin: Skin is warm and dry. No rash noted. No erythema.  Psychiatric: She has a normal mood and affect. Her behavior is normal. Judgment and thought content normal.          Assessment & Plan:  Well exam. The cellulitis has resolved. Set up her first colonoscopy.

## 2012-06-28 ENCOUNTER — Other Ambulatory Visit: Payer: Self-pay | Admitting: Family Medicine

## 2012-06-28 DIAGNOSIS — Z1231 Encounter for screening mammogram for malignant neoplasm of breast: Secondary | ICD-10-CM

## 2012-07-16 ENCOUNTER — Ambulatory Visit (INDEPENDENT_AMBULATORY_CARE_PROVIDER_SITE_OTHER): Payer: BC Managed Care – PPO | Admitting: Family Medicine

## 2012-07-16 ENCOUNTER — Encounter: Payer: Self-pay | Admitting: Family Medicine

## 2012-07-16 VITALS — BP 132/88 | HR 103 | Temp 98.6°F | Wt 173.0 lb

## 2012-07-16 DIAGNOSIS — B349 Viral infection, unspecified: Secondary | ICD-10-CM

## 2012-07-16 DIAGNOSIS — R0789 Other chest pain: Secondary | ICD-10-CM

## 2012-07-16 DIAGNOSIS — B9789 Other viral agents as the cause of diseases classified elsewhere: Secondary | ICD-10-CM

## 2012-07-16 DIAGNOSIS — M940 Chondrocostal junction syndrome [Tietze]: Secondary | ICD-10-CM

## 2012-07-16 NOTE — Progress Notes (Signed)
  Subjective:    Patient ID: [name], female    DOB: [birth year], 56 y.o.   MRN: [id number]  HPI Here for several days of chest discomfort. About 4 days ago she developed nausea, vomiting, and diarrhea which lasted 2 days. No fever or St or cough. These symptoms went away. Then several days ago she developed a dull achy pain over the sternum. It hurts worse to take a deep breath. No SOB.    Review of Systems  Constitutional: Negative.   Respiratory: Negative.   Cardiovascular: Positive for chest pain. Negative for palpitations and leg swelling.       Objective:   Physical Exam  Constitutional: She appears well-developed and well-nourished. No distress.  Neck: No thyromegaly present.  Cardiovascular: Normal rate, regular rhythm, normal heart sounds and intact distal pulses.   EKG normal  Pulmonary/Chest: Effort normal and breath sounds normal. No respiratory distress. She has no wheezes. She has no rales.  Tender over both sternum margins  Lymphadenopathy:    She has no cervical adenopathy.          Assessment & Plan:  This is costochondritis that is secondary to a viral illness. She will rest, drink fluids, take Advil prn. Out of work today.

## 2012-07-19 ENCOUNTER — Ambulatory Visit
Admission: RE | Admit: 2012-07-19 | Discharge: 2012-07-19 | Disposition: A | Discharge status: Home / Self Care | Payer: BC Managed Care – PPO | Source: Ambulatory Visit | Attending: Family Medicine | Admitting: Family Medicine

## 2012-07-19 DIAGNOSIS — Z1231 Encounter for screening mammogram for malignant neoplasm of breast: Secondary | ICD-10-CM

## 2012-08-01 ENCOUNTER — Encounter: Payer: Self-pay | Admitting: Family Medicine

## 2012-08-01 ENCOUNTER — Ambulatory Visit (INDEPENDENT_AMBULATORY_CARE_PROVIDER_SITE_OTHER): Payer: BC Managed Care – PPO | Admitting: Family Medicine

## 2012-08-01 VITALS — BP 144/86 | HR 100 | Temp 98.6°F | Wt 175.0 lb

## 2012-08-01 DIAGNOSIS — K219 Gastro-esophageal reflux disease without esophagitis: Secondary | ICD-10-CM

## 2012-08-01 NOTE — Progress Notes (Signed)
  Subjective:    Patient ID: [name], female    DOB: 01-30-1956, 57 y.o.   MRN: [id number]  HPI Here for 3 days of intermittent squeezing pains in the chest and the throat. Mild and not severe. Sometimes food seems to stop partway down when swallowing. No SOB. No nausea or heartburn.    Review of Systems  Constitutional: Negative.   Respiratory: Negative.   Cardiovascular: Positive for chest pain. Negative for palpitations and leg swelling.  Gastrointestinal: Negative.        Objective:   Physical Exam  Constitutional: She appears well-developed and well-nourished.  Neck: Neck supple. No tracheal deviation present. No thyromegaly present.  Cardiovascular: Normal rate, regular rhythm, normal heart sounds and intact distal pulses.   Pulmonary/Chest: Effort normal and breath sounds normal.  Lymphadenopathy:    She has no cervical adenopathy.          Assessment & Plan:  This is consistent with esophageal spasms, likely resulting from GERD. Try Prilosec OTC every am for a month

## 2012-09-05 IMAGING — CR DG ABDOMEN ACUTE W/ 1V CHEST
3 series · 3 of 3 positions shown · non-contrast
Comparison: None.

CLINICAL DATA: Epigastric pain, nausea, vomiting.

ACUTE ABDOMEN SERIES (ABDOMEN 2 VIEW & CHEST 1 VIEW)

[w chest pa]
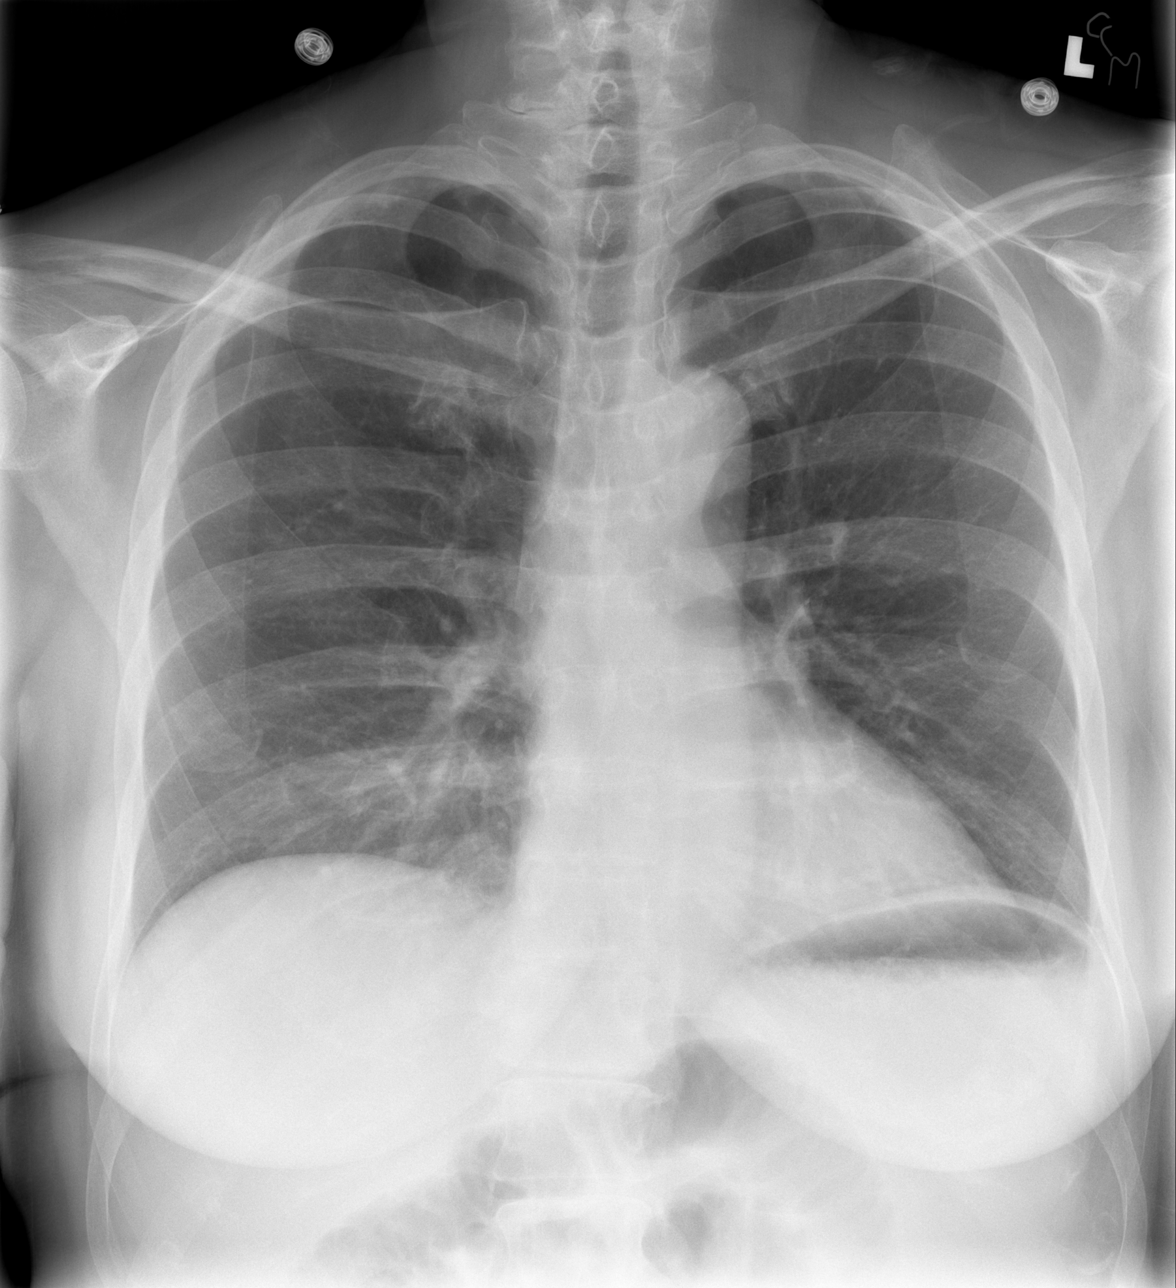

[w abdomen upright]
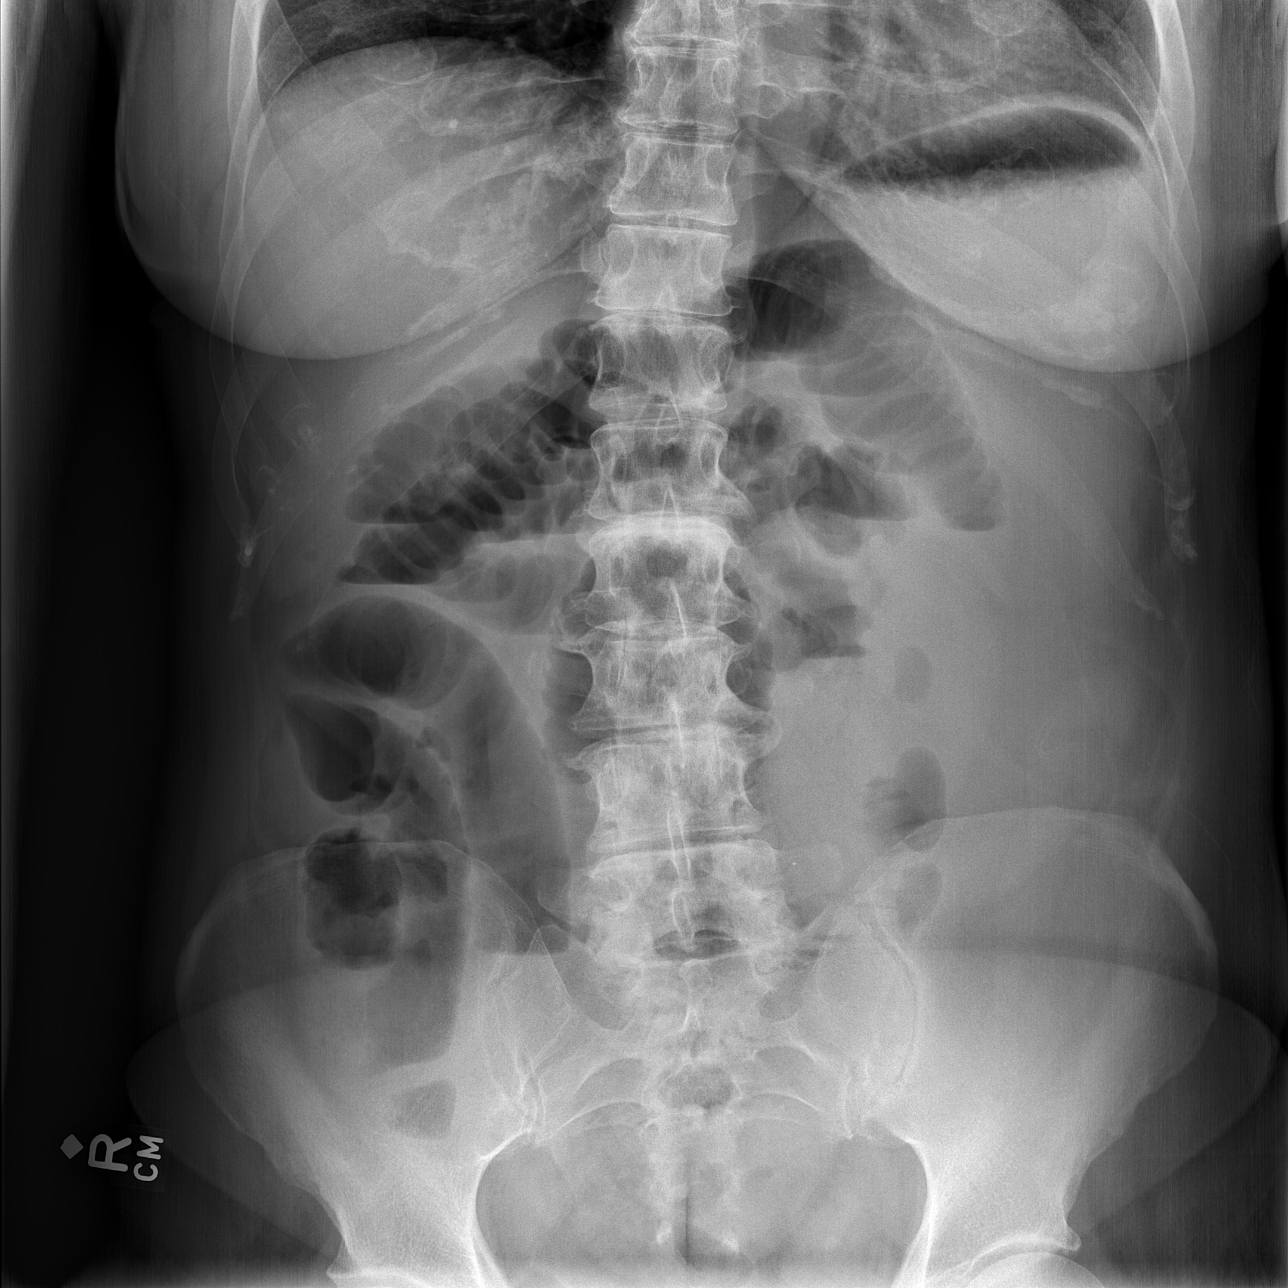

[t abdomen supine]
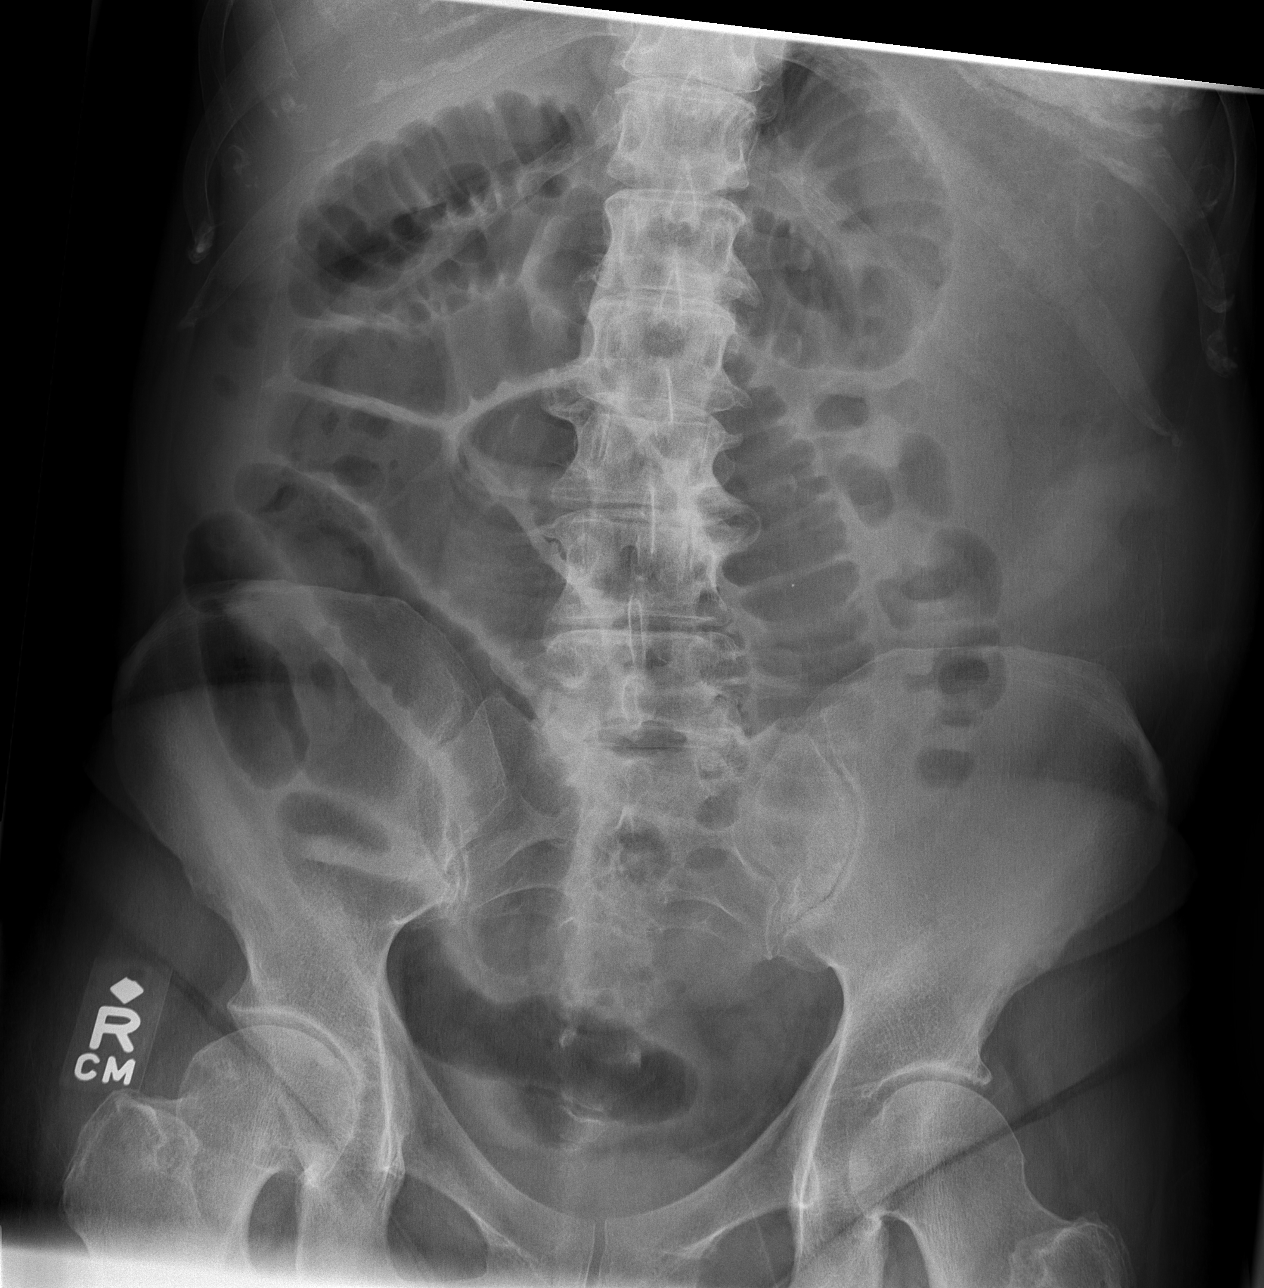

[3 of 3 positions shown; findings below may reference images not displayed]

FINDINGS: Lungs are clear.  Cardiomediastinal contours are within
normal limits.  Dilated loops of small bowel with air-fluid levels,
measuring up to 4 cm.  No free intraperitoneal air identified.
Relative paucity of bowel gas within large bowel loops.
IMPRESSION: Small bowel dilatation in an obstruction pattern.

## 2012-09-07 IMAGING — CR DG ABDOMEN 2V
2 series · 2 of 2 positions shown · non-contrast
Comparison: 02/03/2011

CLINICAL DATA: Enteritis.

ABDOMEN - 2 VIEW

[w abdomen upright]
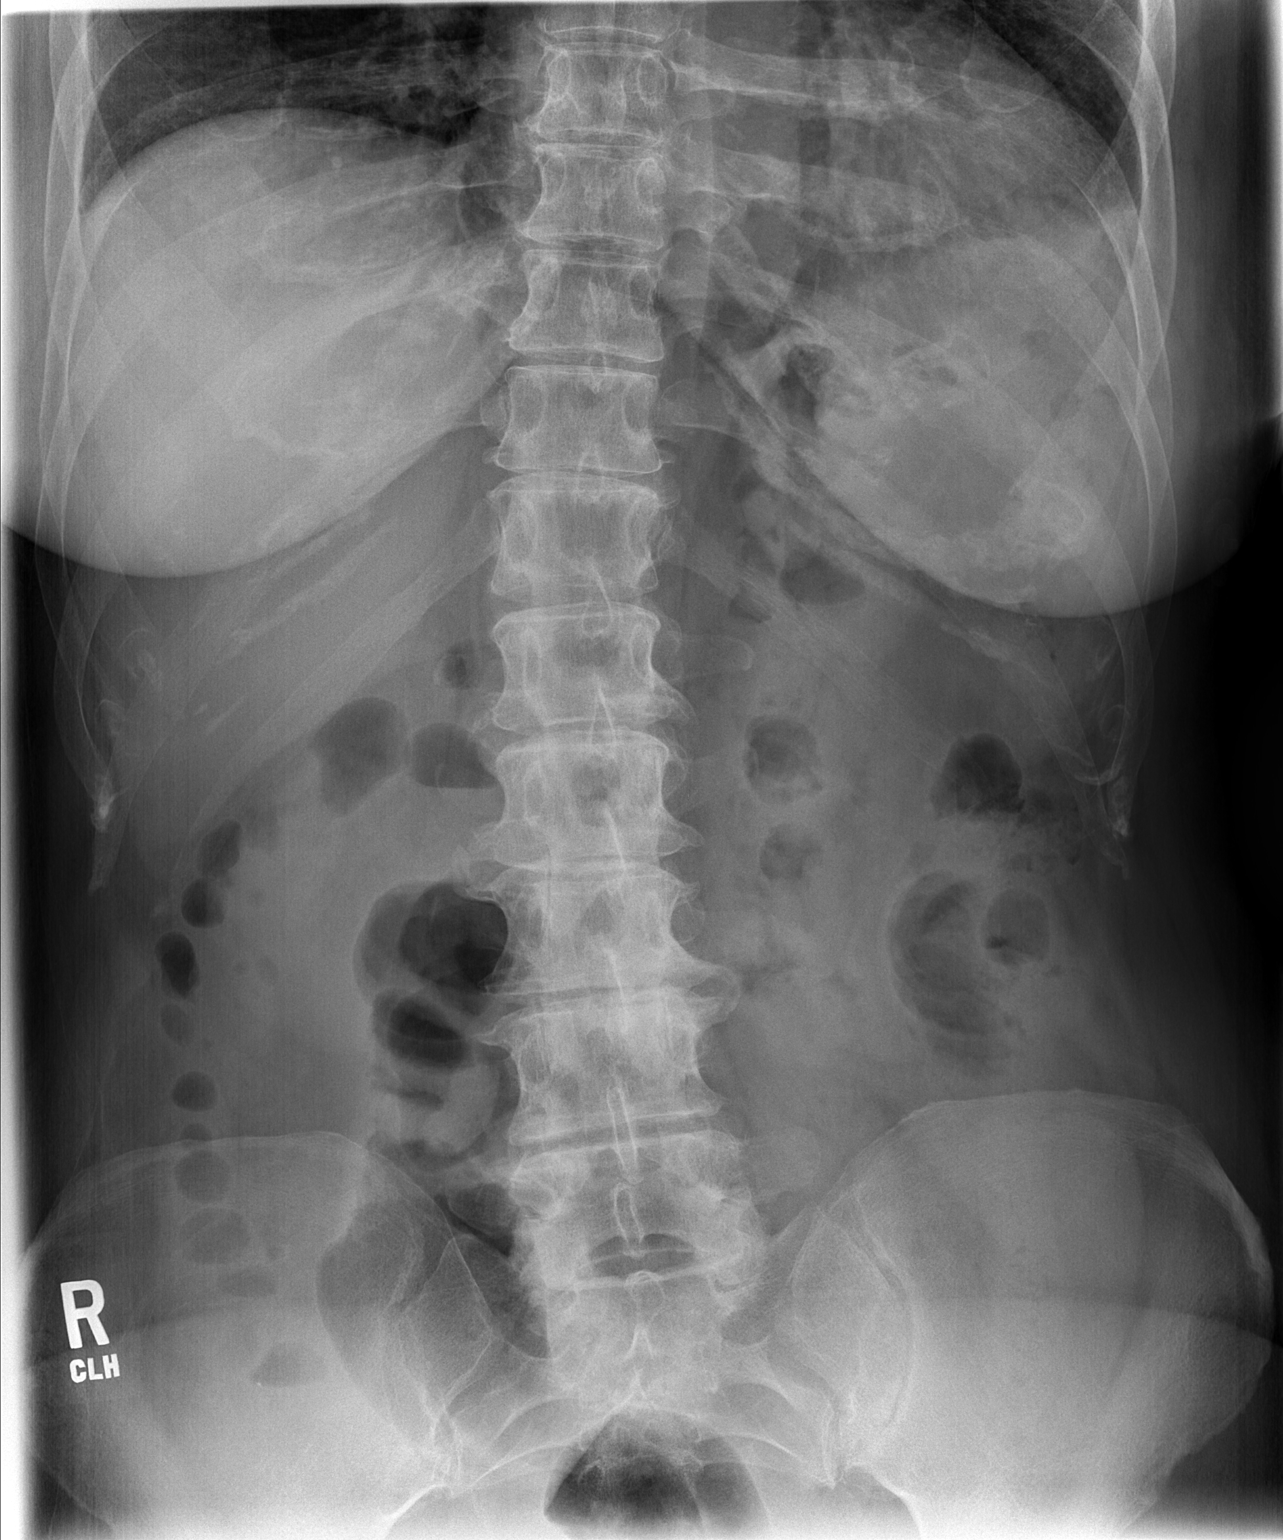

[t abdomen supine]
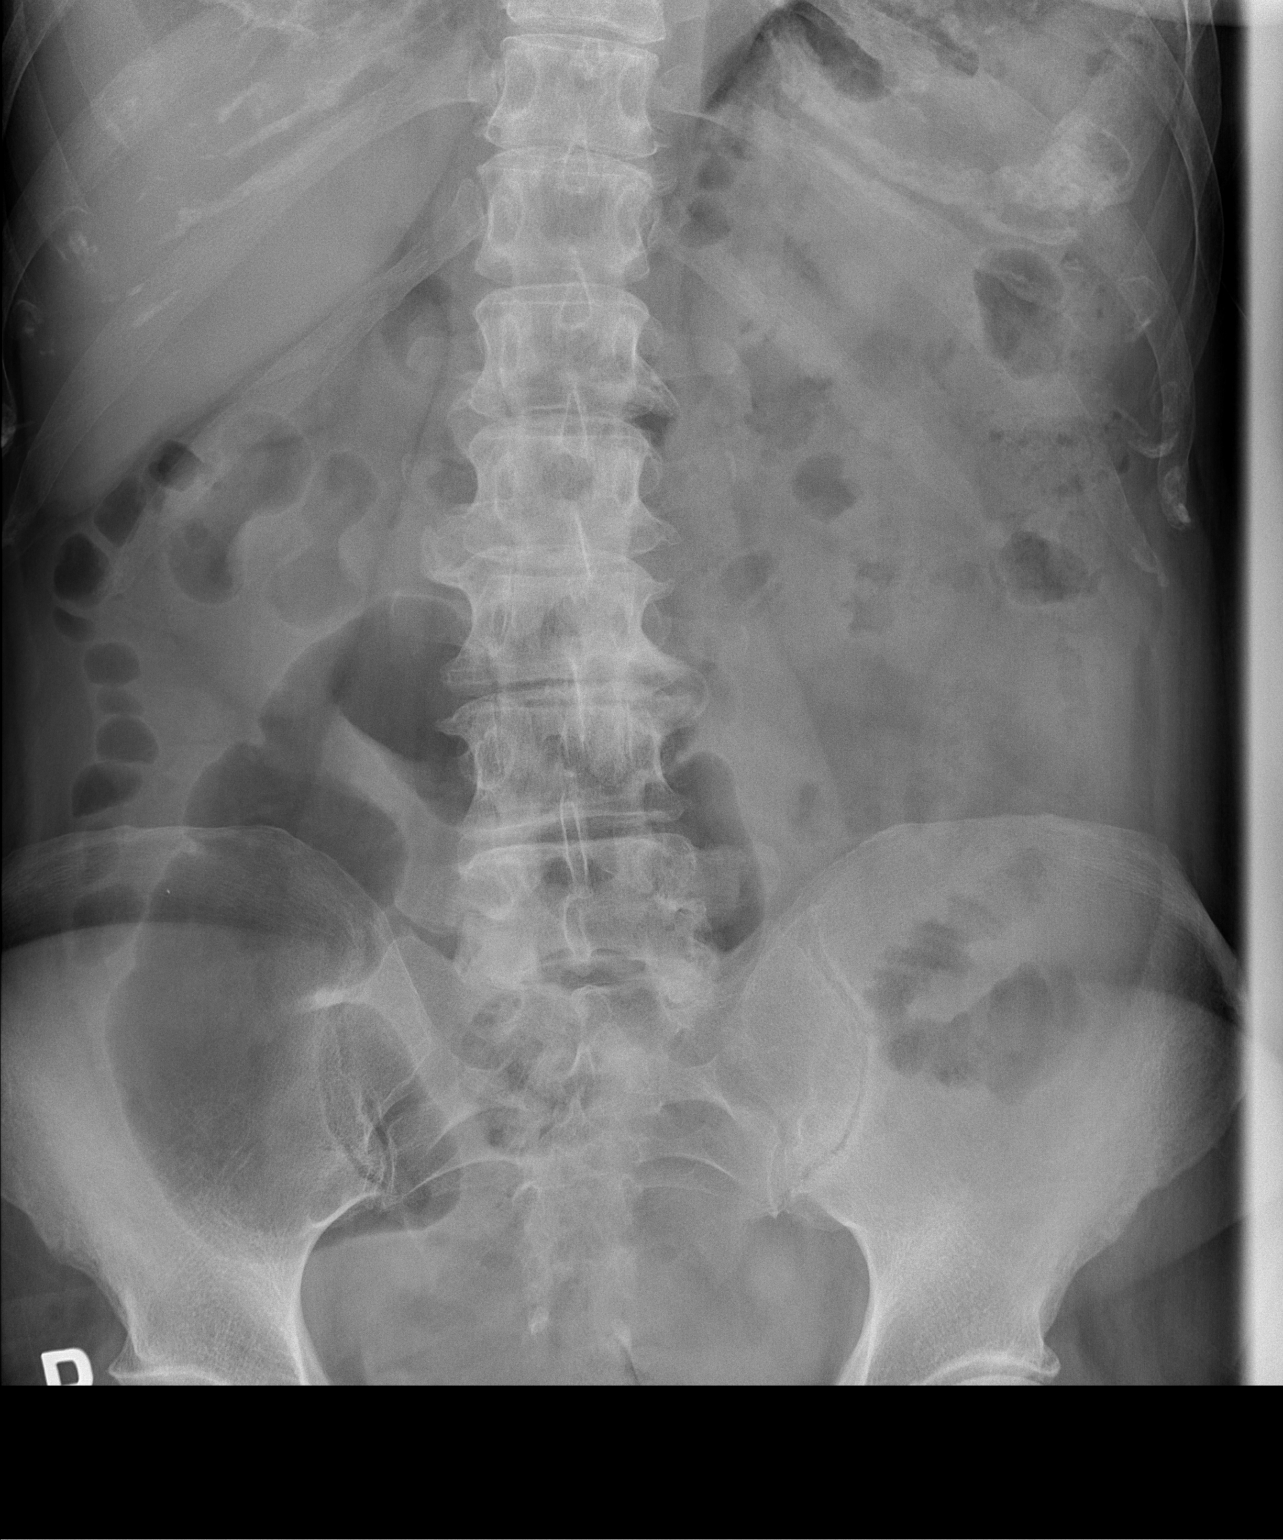

[2 of 2 positions shown; findings below may reference images not displayed]

FINDINGS: There has been improvement in dilatation of small bowel
loops.  There is mild gaseous distension of the sigmoid but no
evidence for obstruction.  No evidence for free intraperitoneal
air.  Degenerative changes are seen in the spine.  No evidence for
organomegaly.
IMPRESSION: Nonobstructive bowel gas pattern.  Significant improvement in
dilatation of small bowel loops.

## 2012-09-18 ENCOUNTER — Other Ambulatory Visit: Payer: Self-pay | Admitting: Family Medicine

## 2012-09-18 NOTE — Telephone Encounter (Signed)
Call in #[phone] with 5 rf 

## 2013-02-22 ENCOUNTER — Other Ambulatory Visit: Payer: Self-pay | Admitting: Family Medicine

## 2013-02-22 ENCOUNTER — Telehealth: Payer: Self-pay | Admitting: Family Medicine

## 2013-02-22 DIAGNOSIS — Z0000 Encounter for general adult medical examination without abnormal findings: Secondary | ICD-10-CM

## 2013-02-22 MED ORDER — NIFEDIPINE ER OSMOTIC RELEASE 90 MG PO TB24: 90.0000 mg | ORAL_TABLET | Freq: Every day | ORAL | Status: DC

## 2013-02-22 NOTE — Telephone Encounter (Addendum)
Pt needs refill of NIFEdipine (PROCARDIA XL/ADALAT-CC) 90 MG 24 hr tablet Pt has made her cpe for 11/10. Pt is out of her med this weekend and will need refill asap.  Pt would like to go to [facility] for her cpe labs. It is right on her way to work. Can you put the order in?

## 2013-02-22 NOTE — Telephone Encounter (Signed)
I sent script to pharmacy and put future lab order in computer.

## 2013-02-22 NOTE — Telephone Encounter (Signed)
I left voice message for pt

## 2013-03-22 ENCOUNTER — Ambulatory Visit (INDEPENDENT_AMBULATORY_CARE_PROVIDER_SITE_OTHER): Payer: BC Managed Care – PPO | Admitting: Family Medicine

## 2013-03-22 ENCOUNTER — Telehealth: Payer: Self-pay | Admitting: Family Medicine

## 2013-03-22 DIAGNOSIS — Z0000 Encounter for general adult medical examination without abnormal findings: Secondary | ICD-10-CM

## 2013-03-22 LAB — HEPATIC FUNCTION PANEL
ALT: 21 U/L (ref 0–35)
Bilirubin, Direct: 0.1 mg/dL (ref 0.0–0.3)
Total Bilirubin: 0.7 mg/dL (ref 0.3–1.2)

## 2013-03-22 LAB — URINALYSIS, ROUTINE W REFLEX MICROSCOPIC
Bilirubin Urine: NEGATIVE
Ketones, ur: NEGATIVE
Specific Gravity, Urine: 1.015 (ref 1.000–1.030)
Urobilinogen, UA: 0.2 (ref 0.0–1.0)

## 2013-03-22 LAB — BASIC METABOLIC PANEL
CO2: 25 mEq/L (ref 19–32)
Calcium: 9 mg/dL (ref 8.4–10.5)
Sodium: 137 mEq/L (ref 135–145)

## 2013-03-22 LAB — LIPID PANEL
HDL: 37.8 mg/dL — ABNORMAL LOW (ref >39.00)
LDL Cholesterol: 66 mg/dL (ref 0–99)
Total CHOL/HDL Ratio: 3
Triglycerides: 66 mg/dL (ref 0.0–149.0)

## 2013-03-22 LAB — CBC WITH DIFFERENTIAL/PLATELET
Basophils Absolute: 0 10*3/uL (ref 0.0–0.1)
HCT: 37.5 % (ref 36.0–46.0)
Lymphocytes Relative: 26.5 % (ref 12.0–46.0)
Lymphs Abs: 1.4 10*3/uL (ref 0.7–4.0)
Monocytes Relative: 11.4 % (ref 3.0–12.0)
Neutrophils Relative %: 57.8 % (ref 43.0–77.0)
Platelets: 254 10*3/uL (ref 150.0–400.0)
RDW: 13.2 % (ref 11.5–14.6)

## 2013-03-22 MED ORDER — POTASSIUM CHLORIDE ER 10 MEQ PO TBCR: 20.0000 meq | EXTENDED_RELEASE_TABLET | Freq: Two times a day (BID) | ORAL | Status: DC

## 2013-03-22 NOTE — Progress Notes (Signed)
Quick Note:  I left a voice message for pt's work & home number to return my call. I also sent in a script to [facility] on [address] ______

## 2013-03-22 NOTE — Progress Notes (Signed)
Quick Note:  I spoke with pt ______ 

## 2013-03-22 NOTE — Telephone Encounter (Signed)
Critical call for labs, potassium is 2.8.

## 2013-03-22 NOTE — Telephone Encounter (Signed)
Noted, we will assess all the results when they come in

## 2013-04-01 ENCOUNTER — Encounter: Payer: Self-pay | Admitting: Family Medicine

## 2013-04-01 ENCOUNTER — Ambulatory Visit (INDEPENDENT_AMBULATORY_CARE_PROVIDER_SITE_OTHER): Payer: BC Managed Care – PPO | Admitting: Family Medicine

## 2013-04-01 VITALS — BP 120/82 | HR 95 | Temp 98.2°F | Ht 67.0 in | Wt 168.0 lb

## 2013-04-01 DIAGNOSIS — Z0000 Encounter for general adult medical examination without abnormal findings: Secondary | ICD-10-CM

## 2013-04-01 DIAGNOSIS — E876 Hypokalemia: Secondary | ICD-10-CM

## 2013-04-01 MED ORDER — ZOLPIDEM TARTRATE 10 MG PO TABS: ORAL_TABLET | ORAL | Status: DC

## 2013-04-01 MED ORDER — NIFEDIPINE ER OSMOTIC RELEASE 90 MG PO TB24: 90.0000 mg | ORAL_TABLET | Freq: Every day | ORAL | Status: DC

## 2013-04-01 NOTE — Progress Notes (Signed)
  Subjective:    Patient ID: [name], female    DOB: 1956/05/17, 57 y.o.   MRN: [id number]  HPI 57 yr old female for a cpx. She feels fine. Her labs were notable for a potassium of 2.8. She has been started on potassium pills bid.    Review of Systems  Constitutional: Negative.   HENT: Negative.   Eyes: Negative.   Respiratory: Negative.   Cardiovascular: Negative.   Gastrointestinal: Negative.   Genitourinary: Negative for dysuria, urgency, frequency, hematuria, flank pain, decreased urine volume, enuresis, difficulty urinating, pelvic pain and dyspareunia.  Musculoskeletal: Negative.   Skin: Negative.   Neurological: Negative.   Psychiatric/Behavioral: Negative.        Objective:   Physical Exam  Constitutional: She is oriented to person, place, and time. She appears well-developed and well-nourished. No distress.  HENT:  Head: Normocephalic and atraumatic.  Right Ear: External ear normal.  Left Ear: External ear normal.  Nose: Nose normal.  Mouth/Throat: Oropharynx is clear and moist. No oropharyngeal exudate.  Eyes: Conjunctivae and EOM are normal. Pupils are equal, round, and reactive to light. No scleral icterus.  Neck: Normal range of motion. Neck supple. No JVD present. No thyromegaly present.  Cardiovascular: Normal rate, regular rhythm, normal heart sounds and intact distal pulses.  Exam reveals no gallop and no friction rub.   No murmur heard. Pulmonary/Chest: Effort normal and breath sounds normal. No respiratory distress. She has no wheezes. She has no rales. She exhibits no tenderness.  Abdominal: Soft. Bowel sounds are normal. She exhibits no distension and no mass. There is no tenderness. There is no rebound and no guarding.  Musculoskeletal: Normal range of motion. She exhibits no edema and no tenderness.  Lymphadenopathy:    She has no cervical adenopathy.  Neurological: She is alert and oriented to person, place, and time. She has normal reflexes. No  cranial nerve deficit. She exhibits normal muscle tone. Coordination normal.  Skin: Skin is warm and dry. No rash noted. No erythema.  Psychiatric: She has a normal mood and affect. Her behavior is normal. Judgment and thought content normal.          Assessment & Plan:  Well exam. Recheck a BMET in 3 weeks

## 2013-04-01 NOTE — Progress Notes (Signed)
Pre visit review using our clinic review tool, if applicable. No additional management support is needed unless otherwise documented below in the visit note. 

## 2013-05-07 ENCOUNTER — Other Ambulatory Visit (INDEPENDENT_AMBULATORY_CARE_PROVIDER_SITE_OTHER): Payer: BC Managed Care – PPO

## 2013-05-07 DIAGNOSIS — E876 Hypokalemia: Secondary | ICD-10-CM

## 2013-05-07 LAB — BASIC METABOLIC PANEL
BUN: 16 mg/dL (ref 6–23)
CO2: 24 mEq/L (ref 19–32)
Chloride: 105 mEq/L (ref 96–112)
Creatinine, Ser: 0.9 mg/dL (ref 0.4–1.2)

## 2013-05-08 ENCOUNTER — Encounter: Payer: Self-pay | Admitting: Family Medicine

## 2013-06-27 ENCOUNTER — Other Ambulatory Visit: Payer: Self-pay

## 2013-06-27 DIAGNOSIS — Z1231 Encounter for screening mammogram for malignant neoplasm of breast: Secondary | ICD-10-CM

## 2013-07-22 ENCOUNTER — Ambulatory Visit
Admission: RE | Admit: 2013-07-22 | Discharge: 2013-07-22 | Disposition: A | Discharge status: Home / Self Care | Payer: BC Managed Care – PPO | Source: Ambulatory Visit

## 2013-07-22 DIAGNOSIS — Z1231 Encounter for screening mammogram for malignant neoplasm of breast: Secondary | ICD-10-CM

## 2013-08-20 ENCOUNTER — Telehealth: Payer: Self-pay | Admitting: Family Medicine

## 2013-08-20 NOTE — Telephone Encounter (Signed)
Pt sent in a request to re-new her zolpidem (AMBIEN) 10 MG tablet.  Dr. Sarajane Jews wrote a note on the paper giving me the "ok" to call insurance and approve another year.  [facility] advised me that if the RX is 15 per 30 days, a PA isn't required.

## 2013-08-20 NOTE — Telephone Encounter (Signed)
Pt is due for refills.

## 2013-08-21 NOTE — Telephone Encounter (Signed)
Call in #[phone] with 5 rf 

## 2013-08-22 MED ORDER — ZOLPIDEM TARTRATE 10 MG PO TABS: ORAL_TABLET | ORAL | Status: DC

## 2013-08-22 NOTE — Telephone Encounter (Signed)
I called in script and spoke with pt. 

## 2013-11-06 ENCOUNTER — Ambulatory Visit (INDEPENDENT_AMBULATORY_CARE_PROVIDER_SITE_OTHER): Payer: BC Managed Care – PPO | Admitting: Family Medicine

## 2013-11-06 VITALS — BP 117/73 | HR 76 | Temp 98.6°F | Wt 167.0 lb

## 2013-11-06 DIAGNOSIS — B009 Herpesviral infection, unspecified: Secondary | ICD-10-CM

## 2013-11-06 DIAGNOSIS — B001 Herpesviral vesicular dermatitis: Secondary | ICD-10-CM

## 2013-11-07 ENCOUNTER — Encounter: Payer: Self-pay | Admitting: Family Medicine

## 2013-11-07 DIAGNOSIS — B001 Herpesviral vesicular dermatitis: Secondary | ICD-10-CM | POA: Insufficient documentation

## 2013-11-07 NOTE — Progress Notes (Signed)
   Subjective:    Patient ID: [name], female    DOB: 06-10-1955, 58 y.o.   MRN: [id number]  HPI Here for several days of blistering and pain in the lower lip. This started after she spent 5 days at the beach. Using Carmax and Abreva gels. She has had fever blisters in the past.    Review of Systems  Constitutional: Negative.   HENT: Positive for mouth sores.   Eyes: Negative.   Respiratory: Negative.        Objective:   Physical Exam  Constitutional: She appears well-developed and well-nourished.  HENT:  The lower lip is swollen and covered with crusting and scabs   Eyes: Conjunctivae are normal.  Lymphadenopathy:    She has no cervical adenopathy.          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 Given Valtrex 1000 mg tid for 10 days. Suggested in the future she use a lip balm with 50 spf when she is in the sun.

## 2014-02-23 ENCOUNTER — Other Ambulatory Visit: Payer: Self-pay | Admitting: Family Medicine

## 2014-02-24 NOTE — Telephone Encounter (Signed)
Refill for 6 months. 

## 2014-04-22 ENCOUNTER — Telehealth: Payer: Self-pay

## 2014-04-22 NOTE — Telephone Encounter (Signed)
Rx request for Nifedipine ER 90 mg tablet- Take 1 tablet by mouth daily.  Pls advise.

## 2014-04-23 MED ORDER — NIFEDIPINE ER OSMOTIC RELEASE 90 MG PO TB24: 90.0000 mg | ORAL_TABLET | Freq: Every day | ORAL | Status: DC

## 2014-04-23 NOTE — Telephone Encounter (Signed)
I sent script e-scribe. 

## 2014-05-24 ENCOUNTER — Other Ambulatory Visit: Payer: Self-pay | Admitting: Family Medicine

## 2014-07-27 ENCOUNTER — Other Ambulatory Visit: Payer: Self-pay | Admitting: Family Medicine

## 2014-07-28 ENCOUNTER — Other Ambulatory Visit: Payer: Self-pay

## 2014-07-28 DIAGNOSIS — Z1231 Encounter for screening mammogram for malignant neoplasm of breast: Secondary | ICD-10-CM

## 2014-08-04 ENCOUNTER — Ambulatory Visit
Admission: RE | Admit: 2014-08-04 | Discharge: 2014-08-04 | Disposition: A | Discharge status: Home / Self Care | Payer: BC Managed Care – PPO | Source: Ambulatory Visit

## 2014-08-04 DIAGNOSIS — Z1231 Encounter for screening mammogram for malignant neoplasm of breast: Secondary | ICD-10-CM

## 2014-08-14 ENCOUNTER — Telehealth: Payer: Self-pay | Admitting: Family Medicine

## 2014-08-14 NOTE — Telephone Encounter (Signed)
Pt is schedule for a physical in April and would like to go to [facility] for her labs the week of April 11 .Requesting that the order be placed in system.

## 2014-08-19 ENCOUNTER — Other Ambulatory Visit: Payer: Self-pay | Admitting: Family Medicine

## 2014-08-19 DIAGNOSIS — Z0000 Encounter for general adult medical examination without abnormal findings: Secondary | ICD-10-CM

## 2014-08-19 NOTE — Telephone Encounter (Signed)
I put future lab order in computer for [name] and left a voice message with this information for pt.

## 2014-08-27 ENCOUNTER — Other Ambulatory Visit (INDEPENDENT_AMBULATORY_CARE_PROVIDER_SITE_OTHER): Payer: BC Managed Care – PPO

## 2014-08-27 DIAGNOSIS — Z0000 Encounter for general adult medical examination without abnormal findings: Secondary | ICD-10-CM

## 2014-08-27 LAB — BASIC METABOLIC PANEL
BUN: 12 mg/dL (ref 6–23)
CHLORIDE: 106 meq/L (ref 96–112)
CO2: 25 mEq/L (ref 19–32)
Calcium: 9.6 mg/dL (ref 8.4–10.5)
Creatinine, Ser: 0.86 mg/dL (ref 0.40–1.20)
GFR: 72.03 mL/min (ref >60.00)
GLUCOSE: 91 mg/dL (ref 70–99)
POTASSIUM: 3.9 meq/L (ref 3.5–5.1)
SODIUM: 136 meq/L (ref 135–145)

## 2014-08-27 LAB — LIPID PANEL
CHOLESTEROL: 116 mg/dL (ref 0–200)
HDL: 43.4 mg/dL (ref >39.00)
LDL CALC: 61 mg/dL (ref 0–99)
NonHDL: 72.6
TRIGLYCERIDES: 60 mg/dL (ref 0.0–149.0)
Total CHOL/HDL Ratio: 3
VLDL: 12 mg/dL (ref 0.0–40.0)

## 2014-08-27 LAB — CBC WITH DIFFERENTIAL/PLATELET
BASOS PCT: 0.7 % (ref 0.0–3.0)
Basophils Absolute: 0 10*3/uL (ref 0.0–0.1)
EOS ABS: 0.2 10*3/uL (ref 0.0–0.7)
Eosinophils Relative: 4.3 % (ref 0.0–5.0)
HCT: 38.3 % (ref 36.0–46.0)
HEMOGLOBIN: 13.2 g/dL (ref 12.0–15.0)
Lymphocytes Relative: 27.7 % (ref 12.0–46.0)
Lymphs Abs: 1.3 10*3/uL (ref 0.7–4.0)
MCHC: 34.4 g/dL (ref 30.0–36.0)
MCV: 85.2 fl (ref 78.0–100.0)
MONO ABS: 0.5 10*3/uL (ref 0.1–1.0)
Monocytes Relative: 10.4 % (ref 3.0–12.0)
NEUTROS ABS: 2.7 10*3/uL (ref 1.4–7.7)
NEUTROS PCT: 56.9 % (ref 43.0–77.0)
Platelets: 263 10*3/uL (ref 150.0–400.0)
RBC: 4.5 Mil/uL (ref 3.87–5.11)
RDW: 12.5 % (ref 11.5–15.5)
WBC: 4.8 10*3/uL (ref 4.0–10.5)

## 2014-08-27 LAB — URINALYSIS, ROUTINE W REFLEX MICROSCOPIC
BILIRUBIN URINE: NEGATIVE
HGB URINE DIPSTICK: NEGATIVE
Ketones, ur: NEGATIVE
NITRITE: NEGATIVE
Specific Gravity, Urine: 1.015 (ref 1.000–1.030)
Total Protein, Urine: NEGATIVE
UROBILINOGEN UA: 0.2 (ref 0.0–1.0)
Urine Glucose: NEGATIVE
pH: 6.5 (ref 5.0–8.0)

## 2014-08-27 LAB — HEPATIC FUNCTION PANEL
ALBUMIN: 4 g/dL (ref 3.5–5.2)
ALK PHOS: 79 U/L (ref 39–117)
ALT: 17 U/L (ref 0–35)
AST: 17 U/L (ref 0–37)
Bilirubin, Direct: 0.2 mg/dL (ref 0.0–0.3)
Total Bilirubin: 0.7 mg/dL (ref 0.2–1.2)
Total Protein: 8.6 g/dL — ABNORMAL HIGH (ref 6.0–8.3)

## 2014-08-27 LAB — TSH: TSH: 0.73 u[IU]/mL (ref 0.35–4.50)

## 2014-09-10 ENCOUNTER — Encounter: Payer: Self-pay | Admitting: Family Medicine

## 2014-09-10 ENCOUNTER — Ambulatory Visit (INDEPENDENT_AMBULATORY_CARE_PROVIDER_SITE_OTHER): Payer: BC Managed Care – PPO | Admitting: Family Medicine

## 2014-09-10 VITALS — BP 126/69 | HR 85 | Temp 98.9°F | Ht 67.0 in | Wt 165.0 lb

## 2014-09-10 DIAGNOSIS — Z0000 Encounter for general adult medical examination without abnormal findings: Secondary | ICD-10-CM | POA: Diagnosis not present

## 2014-09-10 MED ORDER — ZOLPIDEM TARTRATE 10 MG PO TABS: 10.0000 mg | ORAL_TABLET | Freq: Every day | ORAL | Status: DC

## 2014-09-10 NOTE — Progress Notes (Signed)
Pre visit review using our clinic review tool, if applicable. No additional management support is needed unless otherwise documented below in the visit note. 

## 2014-09-10 NOTE — Progress Notes (Signed)
   Subjective:    Patient ID: [name], female    DOB: [birth year], 59 y.o.   MRN: [id number]  HPI 59 yr old female for a cpx. She feels well.    Review of Systems  Constitutional: Negative.   HENT: Negative.   Eyes: Negative.   Respiratory: Negative.   Cardiovascular: Negative.   Gastrointestinal: Negative.   Genitourinary: Negative for dysuria, urgency, frequency, hematuria, flank pain, decreased urine volume, enuresis, difficulty urinating, pelvic pain and dyspareunia.  Musculoskeletal: Negative.   Skin: Negative.   Neurological: Negative.   Psychiatric/Behavioral: Negative.        Objective:   Physical Exam  Constitutional: She is oriented to person, place, and time. She appears well-developed and well-nourished. No distress.  HENT:  Head: Normocephalic and atraumatic.  Right Ear: External ear normal.  Left Ear: External ear normal.  Nose: Nose normal.  Mouth/Throat: Oropharynx is clear and moist. No oropharyngeal exudate.  Eyes: Conjunctivae and EOM are normal. Pupils are equal, round, and reactive to light. No scleral icterus.  Neck: Normal range of motion. Neck supple. No JVD present. No thyromegaly present.  Cardiovascular: Normal rate, regular rhythm, normal heart sounds and intact distal pulses.  Exam reveals no gallop and no friction rub.   No murmur heard. EKG normal   Pulmonary/Chest: Effort normal and breath sounds normal. No respiratory distress. She has no wheezes. She has no rales. She exhibits no tenderness.  Abdominal: Soft. Bowel sounds are normal. She exhibits no distension and no mass. There is no tenderness. There is no rebound and no guarding.  Musculoskeletal: Normal range of motion. She exhibits no edema or tenderness.  Lymphadenopathy:    She has no cervical adenopathy.  Neurological: She is alert and oriented to person, place, and time. She has normal reflexes. No cranial nerve deficit. She exhibits normal muscle tone. Coordination normal.    Skin: Skin is warm and dry. No rash noted. No erythema.  Psychiatric: She has a normal mood and affect. Her behavior is normal. Judgment and thought content normal.          Assessment & Plan:  Well exam. Set up a colonoscopy.

## 2015-02-21 ENCOUNTER — Other Ambulatory Visit: Payer: Self-pay | Admitting: Family Medicine

## 2015-04-01 ENCOUNTER — Other Ambulatory Visit: Payer: Self-pay | Admitting: Family Medicine

## 2015-04-03 NOTE — Telephone Encounter (Signed)
Refill for 6 months. 

## 2015-06-03 ENCOUNTER — Encounter (HOSPITAL_COMMUNITY): Payer: Self-pay | Admitting: *Deleted

## 2015-06-03 ENCOUNTER — Inpatient Hospital Stay (HOSPITAL_COMMUNITY)
Admission: EM | Admit: 2015-06-03 | Discharge: 2015-06-08 | DRG: 392 | Disposition: A | Discharge status: Home / Self Care | Payer: BC Managed Care – PPO | Attending: Internal Medicine | Admitting: Internal Medicine

## 2015-06-03 ENCOUNTER — Emergency Department (HOSPITAL_COMMUNITY): Payer: BC Managed Care – PPO

## 2015-06-03 DIAGNOSIS — D649 Anemia, unspecified: Secondary | ICD-10-CM | POA: Diagnosis present

## 2015-06-03 DIAGNOSIS — K559 Vascular disorder of intestine, unspecified: Secondary | ICD-10-CM | POA: Diagnosis present

## 2015-06-03 DIAGNOSIS — K5660 Unspecified intestinal obstruction: Secondary | ICD-10-CM | POA: Diagnosis present

## 2015-06-03 DIAGNOSIS — A059 Bacterial foodborne intoxication, unspecified: Principal | ICD-10-CM | POA: Diagnosis present

## 2015-06-03 DIAGNOSIS — N179 Acute kidney failure, unspecified: Secondary | ICD-10-CM | POA: Diagnosis present

## 2015-06-03 DIAGNOSIS — G4700 Insomnia, unspecified: Secondary | ICD-10-CM | POA: Diagnosis present

## 2015-06-03 DIAGNOSIS — R140 Abdominal distension (gaseous): Secondary | ICD-10-CM

## 2015-06-03 DIAGNOSIS — R1084 Generalized abdominal pain: Secondary | ICD-10-CM | POA: Diagnosis not present

## 2015-06-03 DIAGNOSIS — K529 Noninfective gastroenteritis and colitis, unspecified: Secondary | ICD-10-CM | POA: Diagnosis present

## 2015-06-03 DIAGNOSIS — R739 Hyperglycemia, unspecified: Secondary | ICD-10-CM | POA: Diagnosis present

## 2015-06-03 DIAGNOSIS — K5289 Other specified noninfective gastroenteritis and colitis: Secondary | ICD-10-CM | POA: Diagnosis not present

## 2015-06-03 DIAGNOSIS — R109 Unspecified abdominal pain: Secondary | ICD-10-CM | POA: Diagnosis present

## 2015-06-03 DIAGNOSIS — I10 Essential (primary) hypertension: Secondary | ICD-10-CM | POA: Diagnosis present

## 2015-06-03 DIAGNOSIS — E860 Dehydration: Secondary | ICD-10-CM | POA: Diagnosis present

## 2015-06-03 DIAGNOSIS — E876 Hypokalemia: Secondary | ICD-10-CM | POA: Diagnosis present

## 2015-06-03 DIAGNOSIS — Z8679 Personal history of other diseases of the circulatory system: Secondary | ICD-10-CM | POA: Diagnosis not present

## 2015-06-03 LAB — URINE MICROSCOPIC-ADD ON: RBC / HPF: NONE SEEN RBC/hpf (ref 0–5)

## 2015-06-03 LAB — CBC
HCT: 43.8 % (ref 36.0–46.0)
Hemoglobin: 14.6 g/dL (ref 12.0–15.0)
MCH: 29.4 pg (ref 26.0–34.0)
MCHC: 33.3 g/dL (ref 30.0–36.0)
MCV: 88.1 fL (ref 78.0–100.0)
Platelets: 283 10*3/uL (ref 150–400)
RBC: 4.97 MIL/uL (ref 3.87–5.11)
RDW: 12.8 % (ref 11.5–15.5)
WBC: 11.5 10*3/uL — AB (ref 4.0–10.5)

## 2015-06-03 LAB — COMPREHENSIVE METABOLIC PANEL
ALBUMIN: 4.2 g/dL (ref 3.5–5.0)
ALK PHOS: 73 U/L (ref 38–126)
ALT: 24 U/L (ref 14–54)
ANION GAP: 13 (ref 5–15)
AST: 19 U/L (ref 15–41)
BILIRUBIN TOTAL: 1.2 mg/dL (ref 0.3–1.2)
BUN: 49 mg/dL — AB (ref 6–20)
CALCIUM: 9.3 mg/dL (ref 8.9–10.3)
CO2: 21 mmol/L — ABNORMAL LOW (ref 22–32)
Chloride: 100 mmol/L — ABNORMAL LOW (ref 101–111)
Creatinine, Ser: 1.78 mg/dL — ABNORMAL HIGH (ref 0.44–1.00)
GFR calc Af Amer: 35 mL/min — ABNORMAL LOW (ref >60)
GFR, EST NON AFRICAN AMERICAN: 30 mL/min — AB (ref >60)
GLUCOSE: 169 mg/dL — AB (ref 65–99)
Potassium: 3.4 mmol/L — ABNORMAL LOW (ref 3.5–5.1)
Sodium: 134 mmol/L — ABNORMAL LOW (ref 135–145)
TOTAL PROTEIN: 9.3 g/dL — AB (ref 6.5–8.1)

## 2015-06-03 LAB — URINALYSIS, ROUTINE W REFLEX MICROSCOPIC
Glucose, UA: NEGATIVE mg/dL
Hgb urine dipstick: NEGATIVE
KETONES UR: 15 mg/dL — AB
NITRITE: NEGATIVE
PROTEIN: 30 mg/dL — AB
Specific Gravity, Urine: 1.022 (ref 1.005–1.030)
pH: 5.5 (ref 5.0–8.0)

## 2015-06-03 LAB — I-STAT CG4 LACTIC ACID, ED: Lactic Acid, Venous: 1.04 mmol/L (ref 0.5–2.0)

## 2015-06-03 LAB — LIPASE, BLOOD: Lipase: 30 U/L (ref 11–51)

## 2015-06-03 MED ORDER — SODIUM CHLORIDE 0.9 % IV BOLUS (SEPSIS)
1000.0000 mL | Freq: Once | INTRAVENOUS | Status: AC
  Administered 2015-06-04: 1000 mL via INTRAVENOUS

## 2015-06-03 MED ORDER — IOHEXOL 300 MG/ML  SOLN
25.0000 mL | INTRAMUSCULAR | Status: AC
Start: 2015-06-03 — End: 2015-06-03
  Administered 2015-06-03 (×2): 25 mL via ORAL

## 2015-06-03 MED ORDER — ONDANSETRON 4 MG PO TBDP
ORAL_TABLET | ORAL | Status: AC
  Filled 2015-06-03: qty 1

## 2015-06-03 MED ORDER — SODIUM CHLORIDE 0.9 % IV BOLUS (SEPSIS)
1000.0000 mL | Freq: Once | INTRAVENOUS | Status: AC
  Administered 2015-06-03: 1000 mL via INTRAVENOUS

## 2015-06-03 MED ORDER — ONDANSETRON HCL 4 MG/2ML IJ SOLN
4.0000 mg | Freq: Once | INTRAMUSCULAR | Status: AC
  Administered 2015-06-03: 4 mg via INTRAVENOUS
  Filled 2015-06-03: qty 2

## 2015-06-03 MED ORDER — PIPERACILLIN-TAZOBACTAM 3.375 G IVPB
3.3750 g | Freq: Once | INTRAVENOUS | Status: DC
  Filled 2015-06-03: qty 50

## 2015-06-03 MED ORDER — ONDANSETRON 4 MG PO TBDP
4.0000 mg | ORAL_TABLET | Freq: Once | ORAL | Status: AC | PRN
  Administered 2015-06-03: 4 mg via ORAL

## 2015-06-03 MED ORDER — HYDROMORPHONE HCL 1 MG/ML IJ SOLN
1.0000 mg | Freq: Once | INTRAMUSCULAR | Status: AC
  Administered 2015-06-03: 1 mg via INTRAVENOUS
  Filled 2015-06-03: qty 1

## 2015-06-03 NOTE — ED Provider Notes (Signed)
CSN: [id number]     Arrival date & time 06/03/15  1336 History   First MD Initiated Contact with Patient 06/03/15 1651     Chief Complaint  Patient presents with  . Abdominal Pain   (Consider location/radiation/quality/duration/timing/severity/associated sxs/prior Treatment) HPI Comments: Patient with history of abdominal hysterectomy presents with complaint of upper abdominal pain for the past 2 days with associated nausea, vomiting, and watery diarrhea. Patient has noted that her abdomen is distended. She denies any pains prior to this. She reports decreased appetite. She is drinking a small amount of fluid. No chest pain or shortness of breath. No urinary symptoms however her urine has looked dark. No blood in the stool. No treatments prior to arrival. Denies EtOH, heavy NSAID use. Last oral intake approximately 1 PM today. The onset of this condition was acute. The course is constant. Aggravating factors: none. Alleviating factors: none.    Patient is a 60 y.o. female presenting with abdominal pain. The history is provided by the patient.  Abdominal Pain Associated symptoms: diarrhea (small amt), nausea and vomiting   Associated symptoms: no chest pain, no constipation, no cough, no dysuria, no fever and no sore throat     Past Medical History  Diagnosis Date  . Hypertension   . Headache(784.0)   . Insomnia   . Insomnia    Past Surgical History  Procedure Laterality Date  . Abdominal hysterectomy    . Cerebral aneurysm repair  1998 bt dr Sherwood Gambler  . Ovarian cyst removal     Family History  Problem Relation Age of Onset  . Hypertension    . Sudden death     Social History  Substance Use Topics  . Smoking status: Never Smoker   . Smokeless tobacco: Never Used  . Alcohol Use: 0.0 oz/week    0 Standard drinks or equivalent per week     Comment: couple times a month   OB History    No data available     Review of Systems  Constitutional: Negative for fever.  HENT:  Negative for rhinorrhea and sore throat.   Eyes: Negative for redness.  Respiratory: Negative for cough.   Cardiovascular: Negative for chest pain.  Gastrointestinal: Positive for nausea, vomiting, abdominal pain and diarrhea (small amt). Negative for constipation and blood in stool.  Genitourinary: Negative for dysuria.  Musculoskeletal: Negative for myalgias.  Skin: Negative for rash.  Neurological: Negative for headaches.    Allergies  Review of patient's allergies indicates no known allergies.  Home Medications   Prior to Admission medications   Medication Sig Start Date End Date Taking? Authorizing Provider  naproxen sodium (ANAPROX) 220 MG tablet Take 220 mg by mouth daily as needed.    Historical Provider, MD  NIFEdipine (ADALAT CC) 90 MG 24 hr tablet TAKE 1 TABLET BY MOUTH EVERY DAY 02/25/15   Laurey Morale, MD  zolpidem (AMBIEN) 10 MG tablet TAKE 1 TABLET BY MOUTH AT BEDTIME 04/03/15   Laurey Morale, MD   BP 109/69 mmHg  Pulse 100  Temp(Src) 97.5 F (36.4 C) (Oral)  Resp 15  SpO2 99%   Physical Exam  Constitutional: She appears well-developed and well-nourished.  HENT:  Head: Normocephalic and atraumatic.  Mouth/Throat: Mucous membranes are dry.  Eyes: Conjunctivae are normal. Right eye exhibits no discharge. Left eye exhibits no discharge.  Neck: Normal range of motion. Neck supple.  Cardiovascular: Regular rhythm and normal heart sounds.  Tachycardia present.   No murmur heard. Mild tachycardia.  Pulmonary/Chest: Effort normal and breath sounds normal. No respiratory distress. She has no wheezes. She has no rales.  Abdominal: Soft. Bowel sounds are decreased. There is tenderness (generalized abdominal pain, more pronounced bilateral upper abdomen). There is no rigidity, no rebound, no guarding, no tenderness at McBurney's point and negative Murphy's sign.  Neurological: She is alert.  Skin: Skin is warm and dry.  Psychiatric: She has a normal mood and affect.   Nursing note and vitals reviewed.   ED Course  Procedures (including critical care time) Labs Review Labs Reviewed  COMPREHENSIVE METABOLIC PANEL - Abnormal; Notable for the following:    Sodium 134 (*)    Potassium 3.4 (*)    Chloride 100 (*)    CO2 21 (*)    Glucose, Bld 169 (*)    BUN 49 (*)    Creatinine, Ser 1.78 (*)    Total Protein 9.3 (*)    GFR calc non Af Amer 30 (*)    GFR calc Af Amer 35 (*)    All other components within normal limits  CBC - Abnormal; Notable for the following:    WBC 11.5 (*)    All other components within normal limits  URINALYSIS, ROUTINE W REFLEX MICROSCOPIC (NOT AT [facility]) - Abnormal; Notable for the following:    Color, Urine AMBER (*)    APPearance CLOUDY (*)    Bilirubin Urine SMALL (*)    Ketones, ur 15 (*)    Protein, ur 30 (*)    Leukocytes, UA MODERATE (*)    All other components within normal limits  URINE MICROSCOPIC-ADD ON - Abnormal; Notable for the following:    Squamous Epithelial / LPF 6-30 (*)    Bacteria, UA FEW (*)    Casts HYALINE CASTS (*)    All other components within normal limits  CULTURE, BLOOD (ROUTINE X 2)  CULTURE, BLOOD (ROUTINE X 2)  LIPASE, BLOOD  I-STAT CG4 LACTIC ACID, ED    Imaging Review Ct Abdomen Pelvis Wo Contrast  06/03/2015  CLINICAL DATA:  60 year old female with upper abdominal pain radiating to the back. Nausea vomiting and diarrhea. EXAM: CT ABDOMEN AND PELVIS WITHOUT CONTRAST TECHNIQUE: Multidetector CT imaging of the abdomen and pelvis was performed following the standard protocol without IV contrast. COMPARISON:  CT dated 02/04/2011 FINDINGS: Evaluation of this exam is limited in the absence of intravenous contrast. The visualized lung bases are clear. No intra-abdominal free air. Trace free fluid within the pelvis. Air noted within the left lobe of the liver compatible with portal venous gas. The gallbladder, pancreas, spleen, and adrenal glands appear unremarkable. There is no hydronephrosis  or nephrolithiasis. Visualized ureters and urinary bladder appear unremarkable. Hysterectomy. Oral contrast noted within the stomach and multiple loops of proximal and mid small bowel. There is diffuse inflammatory changes and dilatation of the small bowel both involving the proximal loops as well as the distal loops within the pelvis. There is a segment of small bowel in the left hemiabdomen with thickened and inflamed wall and small pockets of peripheral and mural gas concerning for pneumatosis. The small bowel loops measure up to 4.2 cm in diameter. The terminal ileum and colon are decompressed. Findings may be related to an infectious enteritis or represent an inflammatory bowel disease with stricture of terminal ileum. An obstruction at the level of the terminal ileum is not excluded. There is aortoiliac atherosclerotic disease. There is diffuse mesenteric stranding. No adenopathy. The abdominal wall soft tissues appear unremarkable. There is degenerative  changes of the spine. No acute fracture. IMPRESSION: Diffuse inflammatory changes and dilatation of the small bowel with segmental wall thickening and pneumatosis of the small bowel in the left hemiabdomen. Intrahepatic portal venous gas identified. Findings compatible with an inflammatory/infectious enteritis with a degree of ischemia. The terminal ileum is collapsed. A degree of obstruction at the level of the terminal ileum is not excluded. Clinical correlation and surgical consult is advised. Critical Value/emergent results were called by telephone at the time of interpretation on 06/03/2015 at 9:46 pm to Dr. Winfred Leeds, who verbally acknowledged these results. Electronically Signed   By: Anner Crete M.D.   On: 06/03/2015 21:48   I have personally reviewed and evaluated these images and lab results as part of my medical decision-making.   EKG Interpretation None       5:14 PM Patient seen and examined. CT ordered. Will have to get non-CM given  AKI. Fluids ordered.   Vital signs reviewed and are as follows: BP 109/69 mmHg  Pulse 100  Temp(Src) 97.5 F (36.4 C) (Oral)  Resp 15  SpO2 99%  CT as above. I consulted with Dr. Donne Hazel who will see patient. He requests medical admit. Reccs Zosyn as antibiotic. Spoke with Dr. Hal Hope who will admit.   Lactic acid is normal.   MDM   Final diagnoses:  Generalized abdominal pain   Admit.    Carlisle Cater, PA-C 06/03/15 [place], MD 06/04/15 [phone]

## 2015-06-03 NOTE — ED Notes (Signed)
Pt reports N/V/D for several days. Pt reports abdominal pain as well. Reports bloating and back pain.

## 2015-06-03 NOTE — Consult Note (Signed)
Reason for Consult:abd pain Referring Physician: Dr [name] is an 60 y.o. female.  HPI: 36 yof with history htn who presents with abd pain, n/v, some loose stools since Monday. Pain not going away so came in today. Not tolerating po. No fevers.  Went to work today.  Had similar episode with ct in system from 2012 that resolved with abx, npo- no further workup done as resolved quickly.   Past Medical History  Diagnosis Date  . Hypertension   . Headache(784.0)   . Insomnia   . Insomnia     Past Surgical History  Procedure Laterality Date  . Abdominal hysterectomy    . Cerebral aneurysm repair  1998 bt dr Sherwood Gambler  . Ovarian cyst removal      Family History  Problem Relation Age of Onset  . Hypertension    . Sudden death      Social History:  reports that she has never smoked. She has never used smokeless tobacco. She reports that she drinks alcohol. She reports that she does not use illicit drugs.  Allergies: No Known Allergies  Medications: I have reviewed the patient's current medications.  Results for orders placed or performed during the hospital encounter of 06/03/15 (from the past 48 hour(s))  Lipase, blood     Status: None   Collection Time: 06/03/15  2:14 PM  Result Value Ref Range   Lipase 30 11 - 51 U/L  Comprehensive metabolic panel     Status: Abnormal   Collection Time: 06/03/15  2:14 PM  Result Value Ref Range   Sodium 134 (L) 135 - 145 mmol/L   Potassium 3.4 (L) 3.5 - 5.1 mmol/L   Chloride 100 (L) 101 - 111 mmol/L   CO2 21 (L) 22 - 32 mmol/L   Glucose, Bld 169 (H) 65 - 99 mg/dL   BUN 49 (H) 6 - 20 mg/dL   Creatinine, Ser 1.78 (H) 0.44 - 1.00 mg/dL   Calcium 9.3 8.9 - 10.3 mg/dL   Total Protein 9.3 (H) 6.5 - 8.1 g/dL   Albumin 4.2 3.5 - 5.0 g/dL   AST 19 15 - 41 U/L   ALT 24 14 - 54 U/L   Alkaline Phosphatase 73 38 - 126 U/L   Total Bilirubin 1.2 0.3 - 1.2 mg/dL   GFR calc non Af Amer 30 (L) >60 mL/min   GFR calc Af Amer 35 (L)  >60 mL/min    Comment: (NOTE) The eGFR has been calculated using the CKD EPI equation. This calculation has not been validated in all clinical situations. eGFR's persistently <60 mL/min signify possible Chronic Kidney Disease.    Anion gap 13 5 - 15  CBC     Status: Abnormal   Collection Time: 06/03/15  2:14 PM  Result Value Ref Range   WBC 11.5 (H) 4.0 - 10.5 K/uL   RBC 4.97 3.87 - 5.11 MIL/uL   Hemoglobin 14.6 12.0 - 15.0 g/dL   HCT 43.8 36.0 - 46.0 %   MCV 88.1 78.0 - 100.0 fL   MCH 29.4 26.0 - 34.0 pg   MCHC 33.3 30.0 - 36.0 g/dL   RDW 12.8 11.5 - 15.5 %   Platelets 283 150 - 400 K/uL  Urinalysis, Routine w reflex microscopic (not at [facility])     Status: Abnormal   Collection Time: 06/03/15  6:19 PM  Result Value Ref Range   Color, Urine AMBER (A) YELLOW    Comment: BIOCHEMICALS MAY BE AFFECTED BY  COLOR   APPearance CLOUDY (A) CLEAR   Specific Gravity, Urine 1.022 1.005 - 1.030   pH 5.5 5.0 - 8.0   Glucose, UA NEGATIVE NEGATIVE mg/dL   Hgb urine dipstick NEGATIVE NEGATIVE   Bilirubin Urine SMALL (A) NEGATIVE   Ketones, ur 15 (A) NEGATIVE mg/dL   Protein, ur 30 (A) NEGATIVE mg/dL   Nitrite NEGATIVE NEGATIVE   Leukocytes, UA MODERATE (A) NEGATIVE  Urine microscopic-add on     Status: Abnormal   Collection Time: 06/03/15  6:19 PM  Result Value Ref Range   Squamous Epithelial / LPF 6-30 (A) NONE SEEN   WBC, UA TOO NUMEROUS TO COUNT 0 - 5 WBC/hpf   RBC / HPF NONE SEEN 0 - 5 RBC/hpf   Bacteria, UA FEW (A) NONE SEEN   Casts HYALINE CASTS (A) NEGATIVE   Urine-Other MUCOUS PRESENT     Ct Abdomen Pelvis Wo Contrast  06/03/2015  CLINICAL DATA:  60 year old female with upper abdominal pain radiating to the back. Nausea vomiting and diarrhea. EXAM: CT ABDOMEN AND PELVIS WITHOUT CONTRAST TECHNIQUE: Multidetector CT imaging of the abdomen and pelvis was performed following the standard protocol without IV contrast. COMPARISON:  CT dated 02/04/2011 FINDINGS: Evaluation of this exam is  limited in the absence of intravenous contrast. The visualized lung bases are clear. No intra-abdominal free air. Trace free fluid within the pelvis. Air noted within the left lobe of the liver compatible with portal venous gas. The gallbladder, pancreas, spleen, and adrenal glands appear unremarkable. There is no hydronephrosis or nephrolithiasis. Visualized ureters and urinary bladder appear unremarkable. Hysterectomy. Oral contrast noted within the stomach and multiple loops of proximal and mid small bowel. There is diffuse inflammatory changes and dilatation of the small bowel both involving the proximal loops as well as the distal loops within the pelvis. There is a segment of small bowel in the left hemiabdomen with thickened and inflamed wall and small pockets of peripheral and mural gas concerning for pneumatosis. The small bowel loops measure up to 4.2 cm in diameter. The terminal ileum and colon are decompressed. Findings may be related to an infectious enteritis or represent an inflammatory bowel disease with stricture of terminal ileum. An obstruction at the level of the terminal ileum is not excluded. There is aortoiliac atherosclerotic disease. There is diffuse mesenteric stranding. No adenopathy. The abdominal wall soft tissues appear unremarkable. There is degenerative changes of the spine. No acute fracture. IMPRESSION: Diffuse inflammatory changes and dilatation of the small bowel with segmental wall thickening and pneumatosis of the small bowel in the left hemiabdomen. Intrahepatic portal venous gas identified. Findings compatible with an inflammatory/infectious enteritis with a degree of ischemia. The terminal ileum is collapsed. A degree of obstruction at the level of the terminal ileum is not excluded. Clinical correlation and surgical consult is advised. Critical Value/emergent results were called by telephone at the time of interpretation on 06/03/2015 at 9:46 pm to Dr. Winfred Leeds, who  verbally acknowledged these results. Electronically Signed   By: Anner Crete M.D.   On: 06/03/2015 21:48    Review of Systems  Constitutional: Negative for fever and chills.  Gastrointestinal: Positive for nausea, vomiting, abdominal pain and diarrhea.   Blood pressure 109/69, pulse 100, temperature 97.5 F (36.4 C), temperature source Oral, resp. rate 15, SpO2 99 %. Physical Exam  Vitals reviewed. Constitutional: She is oriented to person, place, and time. She appears well-developed and well-nourished.  Eyes: No scleral icterus.  Cardiovascular: Regular rhythm.  Tachycardia present.  Respiratory: Effort normal and breath sounds normal. She has no wheezes. She has no rales.  GI: Soft. Bowel sounds are normal. There is tenderness (mild left sided and epigastric tenderness, no peritonitis).  Lymphadenopathy:    She has no cervical adenopathy.  Neurological: She is alert and oriented to person, place, and time.    Assessment/Plan: ? Infectious enteritis, ? Inflammatory bowel disease  She has what might be second episode of this.  She has concerning ct scan with pvg and pneumatosis but clinically she does not appear toxic with exam that does not show peritonitis. I think with history and clinical appearance right now reasonable to admit for abx, npo and hopefully will resolve. If worsens will need surgery.  If resolves will likely need outpt gi workup  [name] 06/03/2015, 10:36 PM

## 2015-06-03 NOTE — ED Provider Notes (Signed)
Complains of upper abdominal pain onset 2 days ago with multiple episodes of vomiting and diarrhea. No hematemesis no blood per rectum. Also feels abdomen is bloated. On exam patient is alert, Glasgow coma score 15 nontoxic-appearing abdomen nondistended. Tender over her upper quadrants with voluntary guarding. Extremities without edema.   Orlie Dakin, MD 06/03/15 2157

## 2015-06-04 ENCOUNTER — Encounter (HOSPITAL_COMMUNITY): Payer: Self-pay | Admitting: Internal Medicine

## 2015-06-04 DIAGNOSIS — N179 Acute kidney failure, unspecified: Secondary | ICD-10-CM | POA: Diagnosis present

## 2015-06-04 DIAGNOSIS — K529 Noninfective gastroenteritis and colitis, unspecified: Secondary | ICD-10-CM | POA: Diagnosis present

## 2015-06-04 LAB — C DIFFICILE QUICK SCREEN W PCR REFLEX
C DIFFICILE (CDIFF) TOXIN: NEGATIVE
C DIFFICLE (CDIFF) ANTIGEN: NEGATIVE
C Diff interpretation: NEGATIVE

## 2015-06-04 LAB — CBC WITH DIFFERENTIAL/PLATELET
Basophils Absolute: 0 10*3/uL (ref 0.0–0.1)
Basophils Relative: 0 %
Eosinophils Absolute: 0 10*3/uL (ref 0.0–0.7)
Eosinophils Relative: 0 %
HEMATOCRIT: 36.5 % (ref 36.0–46.0)
HEMOGLOBIN: 12.1 g/dL (ref 12.0–15.0)
LYMPHS ABS: 1.1 10*3/uL (ref 0.7–4.0)
LYMPHS PCT: 11 %
MCH: 28.7 pg (ref 26.0–34.0)
MCHC: 33.2 g/dL (ref 30.0–36.0)
MCV: 86.5 fL (ref 78.0–100.0)
MONOS PCT: 9 %
Monocytes Absolute: 0.9 10*3/uL (ref 0.1–1.0)
NEUTROS ABS: 7.9 10*3/uL — AB (ref 1.7–7.7)
NEUTROS PCT: 80 %
PLATELETS: 225 10*3/uL (ref 150–400)
RBC: 4.22 MIL/uL (ref 3.87–5.11)
RDW: 12.8 % (ref 11.5–15.5)
WBC: 10 10*3/uL (ref 4.0–10.5)

## 2015-06-04 LAB — COMPREHENSIVE METABOLIC PANEL
ALT: 14 U/L (ref 14–54)
ANION GAP: 8 (ref 5–15)
AST: 11 U/L — ABNORMAL LOW (ref 15–41)
Albumin: 2.8 g/dL — ABNORMAL LOW (ref 3.5–5.0)
Alkaline Phosphatase: 55 U/L (ref 38–126)
BUN: 39 mg/dL — ABNORMAL HIGH (ref 6–20)
CHLORIDE: 109 mmol/L (ref 101–111)
CO2: 20 mmol/L — AB (ref 22–32)
Calcium: 7.6 mg/dL — ABNORMAL LOW (ref 8.9–10.3)
Creatinine, Ser: 1.13 mg/dL — ABNORMAL HIGH (ref 0.44–1.00)
GFR calc non Af Amer: 53 mL/min — ABNORMAL LOW (ref >60)
Glucose, Bld: 125 mg/dL — ABNORMAL HIGH (ref 65–99)
Potassium: 3.4 mmol/L — ABNORMAL LOW (ref 3.5–5.1)
SODIUM: 137 mmol/L (ref 135–145)
Total Bilirubin: 1.5 mg/dL — ABNORMAL HIGH (ref 0.3–1.2)
Total Protein: 6.6 g/dL (ref 6.5–8.1)

## 2015-06-04 LAB — GLUCOSE, CAPILLARY
GLUCOSE-CAPILLARY: 67 mg/dL (ref 65–99)
GLUCOSE-CAPILLARY: 96 mg/dL (ref 65–99)

## 2015-06-04 MED ORDER — ACETAMINOPHEN 650 MG RE SUPP: 650.0000 mg | Freq: Four times a day (QID) | RECTAL | Status: DC | PRN

## 2015-06-04 MED ORDER — MORPHINE SULFATE (PF) 2 MG/ML IV SOLN
1.0000 mg | INTRAVENOUS | Status: DC | PRN
Start: 2015-06-04 — End: 2015-06-08
  Administered 2015-06-04 – 2015-06-07 (×6): 1 mg via INTRAVENOUS
  Filled 2015-06-04 (×6): qty 1

## 2015-06-04 MED ORDER — ACETAMINOPHEN 325 MG PO TABS
650.0000 mg | ORAL_TABLET | Freq: Four times a day (QID) | ORAL | Status: DC | PRN
  Administered 2015-06-07: 650 mg via ORAL
  Filled 2015-06-04: qty 2

## 2015-06-04 MED ORDER — ZOLPIDEM TARTRATE 5 MG PO TABS: 5.0000 mg | ORAL_TABLET | Freq: Every evening | ORAL | Status: DC | PRN

## 2015-06-04 MED ORDER — PIPERACILLIN-TAZOBACTAM 3.375 G IVPB
3.3750 g | Freq: Three times a day (TID) | INTRAVENOUS | Status: DC
  Administered 2015-06-04 – 2015-06-05 (×5): 3.375 g via INTRAVENOUS
  Filled 2015-06-04 (×7): qty 50

## 2015-06-04 MED ORDER — SODIUM CHLORIDE 0.9 % IV SOLN
INTRAVENOUS | Status: AC
  Administered 2015-06-04 (×2): via INTRAVENOUS

## 2015-06-04 MED ORDER — ONDANSETRON HCL 4 MG PO TABS: 4.0000 mg | ORAL_TABLET | Freq: Four times a day (QID) | ORAL | Status: DC | PRN

## 2015-06-04 MED ORDER — POTASSIUM CHLORIDE CRYS ER 20 MEQ PO TBCR
40.0000 meq | EXTENDED_RELEASE_TABLET | Freq: Once | ORAL | Status: AC
  Administered 2015-06-04: 40 meq via ORAL
  Filled 2015-06-04: qty 2

## 2015-06-04 MED ORDER — DEXTROSE 50 % IV SOLN
1.0000 | Freq: Once | INTRAVENOUS | Status: AC
  Administered 2015-06-05: 25 mL via INTRAVENOUS

## 2015-06-04 MED ORDER — ONDANSETRON HCL 4 MG/2ML IJ SOLN
4.0000 mg | Freq: Four times a day (QID) | INTRAMUSCULAR | Status: DC | PRN
  Administered 2015-06-04 – 2015-06-07 (×3): 4 mg via INTRAVENOUS
  Filled 2015-06-04 (×3): qty 2

## 2015-06-04 MED ORDER — HYDRALAZINE HCL 20 MG/ML IJ SOLN: 10.0000 mg | INTRAMUSCULAR | Status: DC | PRN

## 2015-06-04 NOTE — Progress Notes (Signed)
Subjective: Pt with no issues this AM.  States pain some better.  No n/v/d  Objective: Vital signs in last 24 hours: Temp:  [97.5 F (36.4 C)] 97.5 F (36.4 C) (01/11 1352) Pulse Rate:  [75-100] 75 (01/12 0430) Resp:  [15-18] 15 (01/11 1643) BP: (103-126)/(64-73) 107/67 mmHg (01/12 0430) SpO2:  [92 %-99 %] 97 % (01/12 0430)    Intake/Output from previous day:   Intake/Output this shift:    General appearance: alert and cooperative GI: soft, nd, ttp LUQ, no peritoneal signs, no rebound  Lab Results:   Recent Labs  06/03/15 1414 06/04/15 0520  WBC 11.5* 10.0  HGB 14.6 12.1  HCT 43.8 36.5  PLT 283 225   BMET  Recent Labs  06/03/15 1414 06/04/15 0520  NA 134* 137  K 3.4* 3.4*  CL 100* 109  CO2 21* 20*  GLUCOSE 169* 125*  BUN 49* 39*  CREATININE 1.78* 1.13*  CALCIUM 9.3 7.6*    Studies/Results: Ct Abdomen Pelvis Wo Contrast  06/03/2015  CLINICAL DATA:  60 year old female with upper abdominal pain radiating to the back. Nausea vomiting and diarrhea. EXAM: CT ABDOMEN AND PELVIS WITHOUT CONTRAST TECHNIQUE: Multidetector CT imaging of the abdomen and pelvis was performed following the standard protocol without IV contrast. COMPARISON:  CT dated 02/04/2011 FINDINGS: Evaluation of this exam is limited in the absence of intravenous contrast. The visualized lung bases are clear. No intra-abdominal free air. Trace free fluid within the pelvis. Air noted within the left lobe of the liver compatible with portal venous gas. The gallbladder, pancreas, spleen, and adrenal glands appear unremarkable. There is no hydronephrosis or nephrolithiasis. Visualized ureters and urinary bladder appear unremarkable. Hysterectomy. Oral contrast noted within the stomach and multiple loops of proximal and mid small bowel. There is diffuse inflammatory changes and dilatation of the small bowel both involving the proximal loops as well as the distal loops within the pelvis. There is a segment of  small bowel in the left hemiabdomen with thickened and inflamed wall and small pockets of peripheral and mural gas concerning for pneumatosis. The small bowel loops measure up to 4.2 cm in diameter. The terminal ileum and colon are decompressed. Findings may be related to an infectious enteritis or represent an inflammatory bowel disease with stricture of terminal ileum. An obstruction at the level of the terminal ileum is not excluded. There is aortoiliac atherosclerotic disease. There is diffuse mesenteric stranding. No adenopathy. The abdominal wall soft tissues appear unremarkable. There is degenerative changes of the spine. No acute fracture. IMPRESSION: Diffuse inflammatory changes and dilatation of the small bowel with segmental wall thickening and pneumatosis of the small bowel in the left hemiabdomen. Intrahepatic portal venous gas identified. Findings compatible with an inflammatory/infectious enteritis with a degree of ischemia. The terminal ileum is collapsed. A degree of obstruction at the level of the terminal ileum is not excluded. Clinical correlation and surgical consult is advised. Critical Value/emergent results were called by telephone at the time of interpretation on 06/03/2015 at 9:46 pm to Dr. Winfred Leeds, who verbally acknowledged these results. Electronically Signed   By: Anner Crete M.D.   On: 06/03/2015 21:48    Anti-infectives: Anti-infectives    Start     Dose/Rate Route Frequency Ordered Stop   06/04/15 0200  piperacillin-tazobactam (ZOSYN) IVPB 3.375 g     3.375 g 12.5 mL/hr over 240 Minutes Intravenous Every 8 hours 06/04/15 0040     06/03/15 2215  piperacillin-tazobactam (ZOSYN) IVPB 3.375 g  Status:  Discontinued     3.375 g 12.5 mL/hr over 240 Minutes Intravenous  Once 06/03/15 2213 06/04/15 0039      Assessment/Plan: 60 y/o F with abd pain likely due to enteritis Con't NPO Con't with abd exams     LOS: 1 day    Rosario Jacks., Anne Hahn 06/04/2015

## 2015-06-04 NOTE — Progress Notes (Signed)
Hypoglycemic Event  CBG: 67  Treatment: D50   Symptoms: Asymptomatic   Follow-up CBG: Time:0015 CBG Result:146  Possible Reasons for Event: Patient NPO  Comments/MD notified: N/A    Varina Hulon, Rande Brunt

## 2015-06-04 NOTE — H&P (Signed)
[facility] History and Physical  ROBERTINE PHUNG [id number] DOB: 1955-10-26 DOA: 06/03/2015  Referring physician: Mr.Geiple. PCP: Laurey Morale, MD  Specialists: None.  Chief Complaint: Abdominal pain with nausea vomiting.  HPI: [name] is a 60 y.o. female with history of hypertension, intracranial aneurysm status post surgery presents with the ER because of abdominal pain with nausea vomiting and diarrhea. Patient has been having these symptoms for last 3 days. Denies any recent travel or sick contacts. CT abdomen and pelvis done in the ER was showing features concerning for inflammatory versus infectious enteritis with possible ischemia. On-call general surgeon Dr. Donne Hazel was consulted and patient has been admitted for further management. Patient's abdominal pain is mostly in the epigastric and left upper quadrant. Pain is improved with pain relief medications. Patient denies any blood in the vomitus or diarrhea. Denies any recent use of antibiotics.   Review of Systems: As presented in the history of presenting illness, rest negative.  Past Medical History  Diagnosis Date  . Hypertension   . Headache(784.0)   . Insomnia   . Insomnia    Past Surgical History  Procedure Laterality Date  . Abdominal hysterectomy    . Cerebral aneurysm repair  1998 bt dr Sherwood Gambler  . Ovarian cyst removal     Social History:  reports that she has never smoked. She has never used smokeless tobacco. She reports that she drinks alcohol. She reports that she does not use illicit drugs. Where does patient live home. Can patient participate in ADLs? Yes.  No Known Allergies  Family History:  Family History  Problem Relation Age of Onset  . Hypertension    . Sudden death        Prior to Admission medications   Medication Sig Start Date End Date Taking? Authorizing Provider  acetaminophen (TYLENOL) 500 MG tablet Take 1,000 mg by mouth every 6 (six) hours as needed for moderate  pain.   Yes Historical Provider, MD  anti-nausea (EMETROL) solution Take 10 mLs by mouth every 15 (fifteen) minutes as needed for nausea or vomiting.   Yes Historical Provider, MD  dimenhyDRINATE (DRAMAMINE) 50 MG tablet Take 50 mg by mouth every 8 (eight) hours as needed for nausea.   Yes Historical Provider, MD  NIFEdipine (ADALAT CC) 90 MG 24 hr tablet TAKE 1 TABLET BY MOUTH EVERY DAY 02/25/15  Yes Laurey Morale, MD  zolpidem (AMBIEN) 10 MG tablet TAKE 1 TABLET BY MOUTH AT BEDTIME 04/03/15  Yes Laurey Morale, MD    Physical Exam: Filed Vitals:   06/03/15 1352 06/03/15 1643 06/03/15 2030 06/03/15 2300  BP: 125/70 109/69 113/72 116/71  Pulse: 92 100 99 92  Temp: 97.5 F (36.4 C)     TempSrc: Oral     Resp: 18 15    SpO2: 99% 99% 95% 92%     General:  Moderately built and nourished.  Eyes: Anicteric no pallor.  ENT: No discharge from the ears eyes nose or mouth.  Neck: No mass felt. No neck rigidity.  Cardiovascular: S1-S2 heard.  Respiratory: No rhonchi or crepitations.  Abdomen: Soft nontender bowel sounds present. No guarding or rigidity.  Skin: No rash.  Musculoskeletal: No edema.  Psychiatric: Appears normal.  Neurologic: Alert awake oriented to time place and person. Moves all extremities.  Labs on Admission:  Basic Metabolic Panel:  Recent Labs Lab 06/03/15 1414  NA 134*  K 3.4*  CL 100*  CO2 21*  GLUCOSE 169*  BUN 49*  CREATININE 1.78*  CALCIUM 9.3   Liver Function Tests:  Recent Labs Lab 06/03/15 1414  AST 19  ALT 24  ALKPHOS 73  BILITOT 1.2  PROT 9.3*  ALBUMIN 4.2    Recent Labs Lab 06/03/15 1414  LIPASE 30   No results for input(s): AMMONIA in the last 168 hours. CBC:  Recent Labs Lab 06/03/15 1414  WBC 11.5*  HGB 14.6  HCT 43.8  MCV 88.1  PLT 283   Cardiac Enzymes: No results for input(s): CKTOTAL, CKMB, CKMBINDEX, TROPONINI in the last 168 hours.  BNP (last 3 results) No results for input(s): BNP in the last 8760  hours.  ProBNP (last 3 results) No results for input(s): PROBNP in the last 8760 hours.  CBG: No results for input(s): GLUCAP in the last 168 hours.  Radiological Exams on Admission: Ct Abdomen Pelvis Wo Contrast  06/03/2015  CLINICAL DATA:  60 year old female with upper abdominal pain radiating to the back. Nausea vomiting and diarrhea. EXAM: CT ABDOMEN AND PELVIS WITHOUT CONTRAST TECHNIQUE: Multidetector CT imaging of the abdomen and pelvis was performed following the standard protocol without IV contrast. COMPARISON:  CT dated 02/04/2011 FINDINGS: Evaluation of this exam is limited in the absence of intravenous contrast. The visualized lung bases are clear. No intra-abdominal free air. Trace free fluid within the pelvis. Air noted within the left lobe of the liver compatible with portal venous gas. The gallbladder, pancreas, spleen, and adrenal glands appear unremarkable. There is no hydronephrosis or nephrolithiasis. Visualized ureters and urinary bladder appear unremarkable. Hysterectomy. Oral contrast noted within the stomach and multiple loops of proximal and mid small bowel. There is diffuse inflammatory changes and dilatation of the small bowel both involving the proximal loops as well as the distal loops within the pelvis. There is a segment of small bowel in the left hemiabdomen with thickened and inflamed wall and small pockets of peripheral and mural gas concerning for pneumatosis. The small bowel loops measure up to 4.2 cm in diameter. The terminal ileum and colon are decompressed. Findings may be related to an infectious enteritis or represent an inflammatory bowel disease with stricture of terminal ileum. An obstruction at the level of the terminal ileum is not excluded. There is aortoiliac atherosclerotic disease. There is diffuse mesenteric stranding. No adenopathy. The abdominal wall soft tissues appear unremarkable. There is degenerative changes of the spine. No acute fracture.  IMPRESSION: Diffuse inflammatory changes and dilatation of the small bowel with segmental wall thickening and pneumatosis of the small bowel in the left hemiabdomen. Intrahepatic portal venous gas identified. Findings compatible with an inflammatory/infectious enteritis with a degree of ischemia. The terminal ileum is collapsed. A degree of obstruction at the level of the terminal ileum is not excluded. Clinical correlation and surgical consult is advised. Critical Value/emergent results were called by telephone at the time of interpretation on 06/03/2015 at 9:46 pm to Dr. Winfred Leeds, who verbally acknowledged these results. Electronically Signed   By: Anner Crete M.D.   On: 06/03/2015 21:48     Assessment/Plan Principal Problem:   Enteritis Active Problems:   Essential hypertension   Abdominal pain   ARF (acute renal failure) ([place])   1. Abdominal pain with nausea vomiting and diarrhea probably from enteritis differentials include ischemia versus inflammatory versus infectious - appreciate surgery consult. Check GI pathogen panel. Patient has been empirically placed on Zosyn nothing by mouth and IV fluids. 2. Acute renal failure probably from dehydration from diarrhea and vomiting - hydrate and closely monitor  intake output and metabolic panel. 3. History of hypertension - holding off patient's antihypertensives and keeping patient on when necessary IV hydralazine since patient is nothing by mouth. 4. Mild hyperglycemia - check hemoglobin A1c. 5. History of intracranial aneurysm.   DVT Prophylaxis SCDs in anticipation of procedure.  Code Status: Full code.  Family Communication: Patient's daughter at the bedside.  Disposition Plan: Admit to inpatient.    KAKRAKANDY,ARSHAD N. [facility] Pager [phone].  If 7PM-7AM, please contact night-coverage [email or web address] Password [email or web address] 06/04/2015, 12:20 AM

## 2015-06-04 NOTE — Progress Notes (Signed)
ANTIBIOTIC CONSULT NOTE - INITIAL  Pharmacy Consult for Zosyn Indication: intra-abd infection  No Known Allergies  Patient Measurements:    Vital Signs: BP: 107/67 mmHg (01/12 0430) Pulse Rate: 75 (01/12 0430) Intake/Output from previous day:   Intake/Output from this shift:    Labs:  Recent Labs  06/03/15 1414  WBC 11.5*  HGB 14.6  PLT 283  CREATININE 1.78*   CrCl cannot be calculated (Unknown ideal weight.). No results for input(s): VANCOTROUGH, VANCOPEAK, VANCORANDOM, GENTTROUGH, GENTPEAK, GENTRANDOM, TOBRATROUGH, TOBRAPEAK, TOBRARND, AMIKACINPEAK, AMIKACINTROU, AMIKACIN in the last 72 hours.   Microbiology: No results found for this or any previous visit (from the past 720 hour(s)).  Medical History: Past Medical History  Diagnosis Date  . Hypertension   . Headache(784.0)   . Insomnia   . Insomnia     Medications:  See electronic med rec  Assessment: 60 y.o. F presents with abd pain. CCS consulted. Pt on Zosyn for r/o intra-abd infection. Estimated CrCl 40 ml/min. WBC slightly elevated to 11.5. Afeb.  Goal of Therapy:  Resolution of infection  Plan:  Continue Zosyn 3.375gm IV every 8 hours -each dose over 4 hours Follow-up micro data, labs, vitals.  Sherlon Handing, PharmD, BCPS Clinical pharmacist, pager [phone] 06/04/2015,4:57 AM

## 2015-06-04 NOTE — Care Management Note (Signed)
Case Management Note  Patient Details  Name: [name] MRN: [id number] Date of Birth: 08-27-1955  Subjective/Objective:   Patient admitted with enteritis and possible obstruction of bowels. Patient is from home with her parents.                Action/Plan: CM following for discharge needs.   Expected Discharge Date:                  Expected Discharge Plan:     In-House Referral:     Discharge planning Services     Post Acute Care Choice:    Choice offered to:     DME Arranged:    DME Agency:     HH Arranged:    HH Agency:     Status of Service:  In process, will continue to follow  Medicare Important Message Given:    Date Medicare IM Given:    Medicare IM give by:    Date Additional Medicare IM Given:    Additional Medicare Important Message give by:     If discussed at [place] of Stay Meetings, dates discussed:    Additional Comments:  Pollie Friar, RN 06/04/2015, 2:14 PM

## 2015-06-04 NOTE — Progress Notes (Signed)
PROGRESS NOTE    YAIRETH JOPLIN [id number] DOB: 10/10/1955 DOA: 06/03/2015 PCP: Laurey Morale, MD  HPI/Brief narrative 60 year old female patient with history of HTN, intracranial aneurysm status post surgery (Dr. Sherwood Gambler), presented to [facility] ED on 06/03/15 with complaints of abdominal pain, nausea, vomiting and diarrhea. She states that she drank some chocolate milk on Sunday night which did not taste good and from Monday started having above symptoms. Denies cyclic contacts or recent antibiotic use. No history of coffee-ground or bloody emesis or blood/mucus or blood in stools. In the ED, CT abdomen and pelvis suggested infectious enteritis with possible ischemia. Admitted for further evaluation. General surgery consulting. Improving.  Assessment/Plan:   Acute gastroenteritis - DD: Food poisoning/infectious or inflammatory enteritis/ischemia.? SBO - Lactate normal - Treating supportively with bowel rest/nothing by mouth, IV fluids, pain management. Continue Zosyn for now. - Follow GI pathogen panel PCR results. - Improving. -Patient will need elective screening colonoscopy as outpatient at some point-patient advised and indicated that she was planning to get this done through her PCP.   Hypokalemia - Due to GI losses. Replace and follow.  Acute kidney injury - Secondary to GI losses and dehydration. Resolved. Continue IV fluids for additional 24 hours.  Essential hypertension - Controlled  Mild hyperglycemia - Follow A1c  Intracranial aneurysm, status post surgery   DVT prophylaxis: SCDs  Code Status: Full  Family Communication: None at bedside  Disposition Plan: DC home when medically stable, possibly in 2-3 days   Consultants:  Gen Surgery  Procedures:  None  Antimicrobials:  None   Subjective: Feels better. Abdominal pain improved. No vomiting for 2 days. No BM since last night.  Objective: Filed Vitals:   06/04/15 1100 06/04/15 1130 06/04/15 1145  06/04/15 1222  BP: 114/63 111/71 110/68 113/58  Pulse: 76 72 76 75  Temp:    98.3 F (36.8 C)  TempSrc:    Oral  Resp: 15 14 14 16   SpO2: 97% 98% 95% 98%   No intake or output data in the 24 hours ending 06/04/15 1312 There were no vitals filed for this visit.  Exam:  General exam: Pleasant middle-aged female lying comfortably on the gurney in the ED this morning.  Respiratory system: Clear. No increased work of breathing. Cardiovascular system: S1 & S2 heard, RRR. No JVD, murmurs, gallops, clicks or pedal edema. Gastrointestinal system: Abdomen is nondistended, soft. Mild diffuse tenderness without rigidity, guarding or rebound. Normal bowel sounds heard. Central nervous system: Alert and oriented. No focal neurological deficits. Extremities: Symmetric 5 x 5 power.   Data Reviewed: Basic Metabolic Panel:  Recent Labs Lab 06/03/15 1414 06/04/15 0520  NA 134* 137  K 3.4* 3.4*  CL 100* 109  CO2 21* 20*  GLUCOSE 169* 125*  BUN 49* 39*  CREATININE 1.78* 1.13*  CALCIUM 9.3 7.6*   Liver Function Tests:  Recent Labs Lab 06/03/15 1414 06/04/15 0520  AST 19 11*  ALT 24 14  ALKPHOS 73 55  BILITOT 1.2 1.5*  PROT 9.3* 6.6  ALBUMIN 4.2 2.8*    Recent Labs Lab 06/03/15 1414  LIPASE 30   No results for input(s): AMMONIA in the last 168 hours. CBC:  Recent Labs Lab 06/03/15 1414 06/04/15 0520  WBC 11.5* 10.0  NEUTROABS  --  7.9*  HGB 14.6 12.1  HCT 43.8 36.5  MCV 88.1 86.5  PLT 283 225   Cardiac Enzymes: No results for input(s): CKTOTAL, CKMB, CKMBINDEX, TROPONINI in the last 168 hours. BNP (last  3 results) No results for input(s): PROBNP in the last 8760 hours. CBG: No results for input(s): GLUCAP in the last 168 hours.  No results found for this or any previous visit (from the past 240 hour(s)).       Studies: Ct Abdomen Pelvis Wo Contrast  06/03/2015  CLINICAL DATA:  60 year old female with upper abdominal pain radiating to the back. Nausea  vomiting and diarrhea. EXAM: CT ABDOMEN AND PELVIS WITHOUT CONTRAST TECHNIQUE: Multidetector CT imaging of the abdomen and pelvis was performed following the standard protocol without IV contrast. COMPARISON:  CT dated 02/04/2011 FINDINGS: Evaluation of this exam is limited in the absence of intravenous contrast. The visualized lung bases are clear. No intra-abdominal free air. Trace free fluid within the pelvis. Air noted within the left lobe of the liver compatible with portal venous gas. The gallbladder, pancreas, spleen, and adrenal glands appear unremarkable. There is no hydronephrosis or nephrolithiasis. Visualized ureters and urinary bladder appear unremarkable. Hysterectomy. Oral contrast noted within the stomach and multiple loops of proximal and mid small bowel. There is diffuse inflammatory changes and dilatation of the small bowel both involving the proximal loops as well as the distal loops within the pelvis. There is a segment of small bowel in the left hemiabdomen with thickened and inflamed wall and small pockets of peripheral and mural gas concerning for pneumatosis. The small bowel loops measure up to 4.2 cm in diameter. The terminal ileum and colon are decompressed. Findings may be related to an infectious enteritis or represent an inflammatory bowel disease with stricture of terminal ileum. An obstruction at the level of the terminal ileum is not excluded. There is aortoiliac atherosclerotic disease. There is diffuse mesenteric stranding. No adenopathy. The abdominal wall soft tissues appear unremarkable. There is degenerative changes of the spine. No acute fracture. IMPRESSION: Diffuse inflammatory changes and dilatation of the small bowel with segmental wall thickening and pneumatosis of the small bowel in the left hemiabdomen. Intrahepatic portal venous gas identified. Findings compatible with an inflammatory/infectious enteritis with a degree of ischemia. The terminal ileum is collapsed. A  degree of obstruction at the level of the terminal ileum is not excluded. Clinical correlation and surgical consult is advised. Critical Value/emergent results were called by telephone at the time of interpretation on 06/03/2015 at 9:46 pm to Dr. Winfred Leeds, who verbally acknowledged these results. Electronically Signed   By: Anner Crete M.D.   On: 06/03/2015 21:48        Scheduled Meds: . piperacillin-tazobactam (ZOSYN)  IV  3.375 g Intravenous Q8H   Continuous Infusions: . sodium chloride 125 mL/hr at 06/04/15 0340    Principal Problem:   Enteritis Active Problems:   Essential hypertension   Abdominal pain   ARF (acute renal failure) ([facility])    Time spent: 31 minutes    Zeta Bucy, MD, FACP, FHM. [facility] Pager [phone]  If 7PM-7AM, please contact night-coverage [email or web address] Password [email or web address] 06/04/2015, 1:12 PM    LOS: 1 day

## 2015-06-05 ENCOUNTER — Inpatient Hospital Stay (HOSPITAL_COMMUNITY): Payer: BC Managed Care – PPO

## 2015-06-05 DIAGNOSIS — I10 Essential (primary) hypertension: Secondary | ICD-10-CM

## 2015-06-05 DIAGNOSIS — N179 Acute kidney failure, unspecified: Secondary | ICD-10-CM

## 2015-06-05 DIAGNOSIS — K5289 Other specified noninfective gastroenteritis and colitis: Secondary | ICD-10-CM

## 2015-06-05 LAB — GASTROINTESTINAL PANEL BY PCR, STOOL (REPLACES STOOL CULTURE)
ASTROVIRUS: NOT DETECTED
Adenovirus F40/41: NOT DETECTED
CAMPYLOBACTER SPECIES: NOT DETECTED
Cryptosporidium: NOT DETECTED
Cyclospora cayetanensis: NOT DETECTED
E. COLI O157: NOT DETECTED
ENTAMOEBA HISTOLYTICA: NOT DETECTED
ENTEROAGGREGATIVE E COLI (EAEC): NOT DETECTED
ENTEROTOXIGENIC E COLI (ETEC): NOT DETECTED
Enteropathogenic E coli (EPEC): NOT DETECTED
GIARDIA LAMBLIA: NOT DETECTED
NOROVIRUS GI/GII: NOT DETECTED
PLESIMONAS SHIGELLOIDES: NOT DETECTED
Rotavirus A: NOT DETECTED
SALMONELLA SPECIES: NOT DETECTED
SHIGELLA/ENTEROINVASIVE E COLI (EIEC): NOT DETECTED
Sapovirus (I, II, IV, and V): NOT DETECTED
Shiga like toxin producing E coli (STEC): NOT DETECTED
VIBRIO CHOLERAE: NOT DETECTED
Vibrio species: NOT DETECTED
Yersinia enterocolitica: NOT DETECTED

## 2015-06-05 LAB — HEMOGLOBIN A1C
HEMOGLOBIN A1C: 5.5 % (ref 4.8–5.6)
Mean Plasma Glucose: 111 mg/dL

## 2015-06-05 LAB — BASIC METABOLIC PANEL
Anion gap: 7 (ref 5–15)
BUN: 21 mg/dL — AB (ref 6–20)
CALCIUM: 8.4 mg/dL — AB (ref 8.9–10.3)
CHLORIDE: 113 mmol/L — AB (ref 101–111)
CO2: 19 mmol/L — ABNORMAL LOW (ref 22–32)
CREATININE: 1.01 mg/dL — AB (ref 0.44–1.00)
Glucose, Bld: 77 mg/dL (ref 65–99)
Potassium: 3.6 mmol/L (ref 3.5–5.1)
SODIUM: 139 mmol/L (ref 135–145)

## 2015-06-05 LAB — CBC
HCT: 34.1 % — ABNORMAL LOW (ref 36.0–46.0)
Hemoglobin: 11.3 g/dL — ABNORMAL LOW (ref 12.0–15.0)
MCH: 29.5 pg (ref 26.0–34.0)
MCHC: 33.1 g/dL (ref 30.0–36.0)
MCV: 89 fL (ref 78.0–100.0)
PLATELETS: 199 10*3/uL (ref 150–400)
RBC: 3.83 MIL/uL — AB (ref 3.87–5.11)
RDW: 13 % (ref 11.5–15.5)
WBC: 7 10*3/uL (ref 4.0–10.5)

## 2015-06-05 LAB — GLUCOSE, CAPILLARY
GLUCOSE-CAPILLARY: 146 mg/dL — AB (ref 65–99)
GLUCOSE-CAPILLARY: 71 mg/dL (ref 65–99)
GLUCOSE-CAPILLARY: 71 mg/dL (ref 65–99)
Glucose-Capillary: 99 mg/dL (ref 65–99)

## 2015-06-05 MED ORDER — ALPRAZOLAM 0.5 MG PO TABS
0.5000 mg | ORAL_TABLET | Freq: Once | ORAL | Status: AC
Start: 2015-06-05 — End: 2015-06-05
  Administered 2015-06-05: 0.5 mg via ORAL
  Filled 2015-06-05: qty 1

## 2015-06-05 MED ORDER — DEXTROSE-NACL 5-0.9 % IV SOLN
INTRAVENOUS | Status: DC
  Administered 2015-06-05 – 2015-06-07 (×4): via INTRAVENOUS

## 2015-06-05 MED ORDER — SODIUM CHLORIDE 0.9 % IV SOLN: INTRAVENOUS | Status: DC

## 2015-06-05 MED ORDER — DEXTROSE 50 % IV SOLN
INTRAVENOUS | Status: AC
  Filled 2015-06-05: qty 50

## 2015-06-05 MED ORDER — SIMETHICONE 80 MG PO CHEW
80.0000 mg | CHEWABLE_TABLET | Freq: Four times a day (QID) | ORAL | Status: DC | PRN
  Administered 2015-06-05 (×3): 80 mg via ORAL
  Filled 2015-06-05 (×4): qty 1

## 2015-06-05 NOTE — Progress Notes (Addendum)
PROGRESS NOTE    [name] [id number] DOB: [birth year] DOA: 06/03/2015 PCP: Laurey Morale, MD  HPI/Brief narrative 60 year old female patient with history of HTN, intracranial aneurysm status post surgery (Dr. Sherwood Gambler), presented to [facility] ED on 06/03/15 with complaints of abdominal pain, nausea, vomiting and diarrhea. She states that she drank some chocolate milk on Sunday night which did not taste good and from Monday started having above symptoms. Denies cyclic contacts or recent antibiotic use. No history of coffee-ground or bloody emesis or blood/mucus or blood in stools. In the ED, CT abdomen and pelvis suggested infectious enteritis with possible ischemia. Admitted for further evaluation. General surgery consulting. Improving.  Assessment/Plan:   Acute gastroenteritis - DD: Food poisoning/infectious or inflammatory enteritis/ischemia.? SBO - Lactate normal - Treating supportively with bowel rest/nothing by mouth, IV fluids, pain management.  - GI pathogen panel PCR results - neg. C. difficile testing negative. Blood culture 2: Negative to date. DC empirically started Zosyn. -Patient will need elective screening colonoscopy as outpatient at some point-patient advised and indicated that she was planning to get this done through her PCP.  - No further nausea or vomiting. Still with some diarrhea. Gaseous abdominal distention and mild pain but improved. KUB without acute findings. Surgical follow-up appreciated-starting clear liquids. Monitor  Hypokalemia - Due to GI losses. Replaced.  Acute kidney injury - Secondary to GI losses and dehydration. Resolved. Continue gentle IV fluids until oral intake improves.  Essential hypertension - Controlled  Mild hyperglycemia - Follow A1c: 5.5  Intracranial aneurysm, status post surgery  Mild anemia - Possibly dilutional. Follow CBC  Hypoglycemia - Seen on CBGs but asymptomatic. Related to no oral intake. Changed IV fluids to  D5NS. Resuming clear liquids. Follow closely.   DVT prophylaxis: SCDs  Code Status: Full  Family Communication: None at bedside  Disposition Plan: DC home when medically stable, possibly in 1-2 days   Consultants:  Gen Surgery  Procedures:  None  Antimicrobials:  None   Subjective: No further nausea or vomiting. Still with some diarrhea. Gaseous abdominal distention and mild pain but improved. KUB without acute findings.  Objective: Filed Vitals:   06/05/15 0200 06/05/15 0611 06/05/15 1008 06/05/15 1411  BP: 115/68 119/64 108/59 123/67  Pulse: 72 66  69  Temp: 97.9 F (36.6 C) 98 F (36.7 C) 97.8 F (36.6 C) 97.5 F (36.4 C)  TempSrc: Oral Oral Oral Oral  Resp: 16 16 16 18   Height:      Weight:      SpO2: 97% 99% 98% 98%    Intake/Output Summary (Last 24 hours) at 06/05/15 1551 Last data filed at 06/05/15 1500  Gross per 24 hour  Intake    240 ml  Output      0 ml  Net    240 ml   Filed Weights   06/04/15 1222  Weight: 75.796 kg (167 lb 1.6 oz)    Exam:  General exam: Pleasant middle-aged female lying comfortably in bed.  Respiratory system: Clear. No increased work of breathing. Cardiovascular system: S1 & S2 heard, RRR. No JVD, murmurs, gallops, clicks or pedal edema. Gastrointestinal system: Abdomen is nondistended, soft. Mild distention and tenderness without rigidity, guarding or rebound. Hyperactive bowel sounds heard. Central nervous system: Alert and oriented. No focal neurological deficits. Extremities: Symmetric 5 x 5 power.   Data Reviewed: Basic Metabolic Panel:  Recent Labs Lab 06/03/15 1414 06/04/15 0520 06/05/15 0556  NA 134* 137 139  K 3.4* 3.4* 3.6  CL  100* 109 113*  CO2 21* 20* 19*  GLUCOSE 169* 125* 77  BUN 49* 39* 21*  CREATININE 1.78* 1.13* 1.01*  CALCIUM 9.3 7.6* 8.4*   Liver Function Tests:  Recent Labs Lab 06/03/15 1414 06/04/15 0520  AST 19 11*  ALT 24 14  ALKPHOS 73 55  BILITOT 1.2 1.5*  PROT 9.3* 6.6    ALBUMIN 4.2 2.8*    Recent Labs Lab 06/03/15 1414  LIPASE 30   No results for input(s): AMMONIA in the last 168 hours. CBC:  Recent Labs Lab 06/03/15 1414 06/04/15 0520 06/05/15 0556  WBC 11.5* 10.0 7.0  NEUTROABS  --  7.9*  --   HGB 14.6 12.1 11.3*  HCT 43.8 36.5 34.1*  MCV 88.1 86.5 89.0  PLT 283 225 199   Cardiac Enzymes: No results for input(s): CKTOTAL, CKMB, CKMBINDEX, TROPONINI in the last 168 hours. BNP (last 3 results) No results for input(s): PROBNP in the last 8760 hours. CBG:  Recent Labs Lab 06/04/15 1347 06/04/15 2355 06/05/15 0019 06/05/15 0639 06/05/15 1202  GLUCAP 96 67 146* 71 71    Recent Results (from the past 240 hour(s))  Culture, blood (Routine X 2) w Reflex to ID Panel     Status: None (Preliminary result)   Collection Time: 06/03/15 11:37 PM  Result Value Ref Range Status   Specimen Description BLOOD LEFT ARM  Final   Special Requests BOTTLES DRAWN AEROBIC AND ANAEROBIC 10CC  Final   Culture NO GROWTH 1 DAY  Final   Report Status PENDING  Incomplete  Culture, blood (Routine X 2) w Reflex to ID Panel     Status: None (Preliminary result)   Collection Time: 06/03/15 11:45 PM  Result Value Ref Range Status   Specimen Description BLOOD LEFT HAND  Final   Special Requests BOTTLES DRAWN AEROBIC ONLY 6CC  Final   Culture NO GROWTH 1 DAY  Final   Report Status PENDING  Incomplete  C difficile quick scan w PCR reflex     Status: None   Collection Time: 06/04/15 12:57 PM  Result Value Ref Range Status   C Diff antigen NEGATIVE NEGATIVE Final   C Diff toxin NEGATIVE NEGATIVE Final   C Diff interpretation Negative for toxigenic C. difficile  Final  Gastrointestinal Panel by PCR , Stool     Status: None   Collection Time: 06/04/15  3:17 PM  Result Value Ref Range Status   Campylobacter species NOT DETECTED NOT DETECTED Final   Plesimonas shigelloides NOT DETECTED NOT DETECTED Final   Salmonella species NOT DETECTED NOT DETECTED Final    Yersinia enterocolitica NOT DETECTED NOT DETECTED Final   Vibrio species NOT DETECTED NOT DETECTED Final   Vibrio cholerae NOT DETECTED NOT DETECTED Final   Enteroaggregative E coli (EAEC) NOT DETECTED NOT DETECTED Final   Enteropathogenic E coli (EPEC) NOT DETECTED NOT DETECTED Final   Enterotoxigenic E coli (ETEC) NOT DETECTED NOT DETECTED Final   Shiga like toxin producing E coli (STEC) NOT DETECTED NOT DETECTED Final   E. coli O157 NOT DETECTED NOT DETECTED Final   Shigella/Enteroinvasive E coli (EIEC) NOT DETECTED NOT DETECTED Final   Cryptosporidium NOT DETECTED NOT DETECTED Final   Cyclospora cayetanensis NOT DETECTED NOT DETECTED Final   Entamoeba histolytica NOT DETECTED NOT DETECTED Final   Giardia lamblia NOT DETECTED NOT DETECTED Final   Adenovirus F40/41 NOT DETECTED NOT DETECTED Final   Astrovirus NOT DETECTED NOT DETECTED Final   Norovirus GI/GII NOT DETECTED NOT DETECTED  Final   Rotavirus A NOT DETECTED NOT DETECTED Final   Sapovirus (I, II, IV, and V) NOT DETECTED NOT DETECTED Final         Studies: Ct Abdomen Pelvis Wo Contrast  06/03/2015  CLINICAL DATA:  60 year old female with upper abdominal pain radiating to the back. Nausea vomiting and diarrhea. EXAM: CT ABDOMEN AND PELVIS WITHOUT CONTRAST TECHNIQUE: Multidetector CT imaging of the abdomen and pelvis was performed following the standard protocol without IV contrast. COMPARISON:  CT dated 02/04/2011 FINDINGS: Evaluation of this exam is limited in the absence of intravenous contrast. The visualized lung bases are clear. No intra-abdominal free air. Trace free fluid within the pelvis. Air noted within the left lobe of the liver compatible with portal venous gas. The gallbladder, pancreas, spleen, and adrenal glands appear unremarkable. There is no hydronephrosis or nephrolithiasis. Visualized ureters and urinary bladder appear unremarkable. Hysterectomy. Oral contrast noted within the stomach and multiple loops of  proximal and mid small bowel. There is diffuse inflammatory changes and dilatation of the small bowel both involving the proximal loops as well as the distal loops within the pelvis. There is a segment of small bowel in the left hemiabdomen with thickened and inflamed wall and small pockets of peripheral and mural gas concerning for pneumatosis. The small bowel loops measure up to 4.2 cm in diameter. The terminal ileum and colon are decompressed. Findings may be related to an infectious enteritis or represent an inflammatory bowel disease with stricture of terminal ileum. An obstruction at the level of the terminal ileum is not excluded. There is aortoiliac atherosclerotic disease. There is diffuse mesenteric stranding. No adenopathy. The abdominal wall soft tissues appear unremarkable. There is degenerative changes of the spine. No acute fracture. IMPRESSION: Diffuse inflammatory changes and dilatation of the small bowel with segmental wall thickening and pneumatosis of the small bowel in the left hemiabdomen. Intrahepatic portal venous gas identified. Findings compatible with an inflammatory/infectious enteritis with a degree of ischemia. The terminal ileum is collapsed. A degree of obstruction at the level of the terminal ileum is not excluded. Clinical correlation and surgical consult is advised. Critical Value/emergent results were called by telephone at the time of interpretation on 06/03/2015 at 9:46 pm to Dr. Winfred Leeds, who verbally acknowledged these results. Electronically Signed   By: Anner Crete M.D.   On: 06/03/2015 21:48   Dg Abd Portable 1v  06/05/2015  CLINICAL DATA:  Abdominal distension.  History of gastroenteritis. EXAM: PORTABLE ABDOMEN - 1 VIEW COMPARISON:  CT scan 06/03/2015 FINDINGS: Scattered air throughout the colon is noted. There are few mildly dilated small bowel loops in the left upper quadrant. The soft tissue shadows are maintained. No worrisome calcifications. IMPRESSION:  Improving bowel gas pattern with air now seen throughout the colon. Electronically Signed   By: Marijo Sanes M.D.   On: 06/05/2015 08:13        Scheduled Meds: . piperacillin-tazobactam (ZOSYN)  IV  3.375 g Intravenous Q8H   Continuous Infusions: . dextrose 5 % and 0.9% NaCl 100 mL/hr at 06/05/15 0818    Principal Problem:   Enteritis Active Problems:   Essential hypertension   Abdominal pain   ARF (acute renal failure) (HCC)    Time spent: 25 minutes    Khamille Beynon, MD, FACP, FHM. [facility] Pager [phone]  If 7PM-7AM, please contact night-coverage [email or web address] Password [email or web address] 06/05/2015, 3:51 PM    LOS: 2 days

## 2015-06-05 NOTE — Progress Notes (Signed)
Subjective: PT with improved abd pain. Con't with diarrhea  Objective: Vital signs in last 24 hours: Temp:  [97.8 F (36.6 C)-98.3 F (36.8 C)] 97.8 F (36.6 C) (01/13 1008) Pulse Rate:  [66-76] 66 (01/13 0611) Resp:  [14-16] 16 (01/13 1008) BP: (106-119)/(58-71) 108/59 mmHg (01/13 1008) SpO2:  [95 %-99 %] 98 % (01/13 1008) Weight:  [75.796 kg (167 lb 1.6 oz)] 75.796 kg (167 lb 1.6 oz) (01/12 1222) Last BM Date: 06/04/15  Intake/Output from previous day:   Intake/Output this shift:    General appearance: alert and cooperative GI: soft, min midline ttp, no rebound/guarding  Lab Results:   Recent Labs  06/04/15 0520 06/05/15 0556  WBC 10.0 7.0  HGB 12.1 11.3*  HCT 36.5 34.1*  PLT 225 199   BMET  Recent Labs  06/04/15 0520 06/05/15 0556  NA 137 139  K 3.4* 3.6  CL 109 113*  CO2 20* 19*  GLUCOSE 125* 77  BUN 39* 21*  CREATININE 1.13* 1.01*  CALCIUM 7.6* 8.4*   PT/INR No results for input(s): LABPROT, INR in the last 72 hours. ABG No results for input(s): PHART, HCO3 in the last 72 hours.  Invalid input(s): PCO2, PO2  Studies/Results: Ct Abdomen Pelvis Wo Contrast  06/03/2015  CLINICAL DATA:  60 year old female with upper abdominal pain radiating to the back. Nausea vomiting and diarrhea. EXAM: CT ABDOMEN AND PELVIS WITHOUT CONTRAST TECHNIQUE: Multidetector CT imaging of the abdomen and pelvis was performed following the standard protocol without IV contrast. COMPARISON:  CT dated 02/04/2011 FINDINGS: Evaluation of this exam is limited in the absence of intravenous contrast. The visualized lung bases are clear. No intra-abdominal free air. Trace free fluid within the pelvis. Air noted within the left lobe of the liver compatible with portal venous gas. The gallbladder, pancreas, spleen, and adrenal glands appear unremarkable. There is no hydronephrosis or nephrolithiasis. Visualized ureters and urinary bladder appear unremarkable. Hysterectomy. Oral contrast  noted within the stomach and multiple loops of proximal and mid small bowel. There is diffuse inflammatory changes and dilatation of the small bowel both involving the proximal loops as well as the distal loops within the pelvis. There is a segment of small bowel in the left hemiabdomen with thickened and inflamed wall and small pockets of peripheral and mural gas concerning for pneumatosis. The small bowel loops measure up to 4.2 cm in diameter. The terminal ileum and colon are decompressed. Findings may be related to an infectious enteritis or represent an inflammatory bowel disease with stricture of terminal ileum. An obstruction at the level of the terminal ileum is not excluded. There is aortoiliac atherosclerotic disease. There is diffuse mesenteric stranding. No adenopathy. The abdominal wall soft tissues appear unremarkable. There is degenerative changes of the spine. No acute fracture. IMPRESSION: Diffuse inflammatory changes and dilatation of the small bowel with segmental wall thickening and pneumatosis of the small bowel in the left hemiabdomen. Intrahepatic portal venous gas identified. Findings compatible with an inflammatory/infectious enteritis with a degree of ischemia. The terminal ileum is collapsed. A degree of obstruction at the level of the terminal ileum is not excluded. Clinical correlation and surgical consult is advised. Critical Value/emergent results were called by telephone at the time of interpretation on 06/03/2015 at 9:46 pm to Dr. Winfred Leeds, who verbally acknowledged these results. Electronically Signed   By: Anner Crete M.D.   On: 06/03/2015 21:48   Dg Abd Portable 1v  06/05/2015  CLINICAL DATA:  Abdominal distension.  History of gastroenteritis. EXAM:  PORTABLE ABDOMEN - 1 VIEW COMPARISON:  CT scan 06/03/2015 FINDINGS: Scattered air throughout the colon is noted. There are few mildly dilated small bowel loops in the left upper quadrant. The soft tissue shadows are  maintained. No worrisome calcifications. IMPRESSION: Improving bowel gas pattern with air now seen throughout the colon. Electronically Signed   By: Marijo Sanes M.D.   On: 06/05/2015 08:13    Anti-infectives: Anti-infectives    Start     Dose/Rate Route Frequency Ordered Stop   06/04/15 0200  piperacillin-tazobactam (ZOSYN) IVPB 3.375 g     3.375 g 12.5 mL/hr over 240 Minutes Intravenous Every 8 hours 06/04/15 0040     06/03/15 2215  piperacillin-tazobactam (ZOSYN) IVPB 3.375 g  Status:  Discontinued     3.375 g 12.5 mL/hr over 240 Minutes Intravenous  Once 06/03/15 2213 06/04/15 0039      Assessment/Plan: Enteritis -Start CLD -Adv diet as pt tol -No surgical plans -Mobilize    LOS: 2 days    Rosario Jacks., [facility] 06/05/2015

## 2015-06-05 NOTE — Progress Notes (Signed)
ANTIBIOTIC CONSULT NOTE  Pharmacy Consult for Zosyn Indication: intra-abd infection  No Known Allergies  Patient Measurements: Height: 5\' 8"  (172.7 cm) Weight: 167 lb 1.6 oz (75.796 kg) IBW/kg (Calculated) : 63.9  Vital Signs: Temp: 98 F (36.7 C) (01/13 0611) Temp Source: Oral (01/13 0611) BP: 119/64 mmHg (01/13 0611) Pulse Rate: 66 (01/13 0611) Intake/Output from previous day:   Intake/Output from this shift:    Labs:  Recent Labs  06/03/15 1414 06/04/15 0520 06/05/15 0556  WBC 11.5* 10.0 7.0  HGB 14.6 12.1 11.3*  PLT 283 225 199  CREATININE 1.78* 1.13* 1.01*   Estimated Creatinine Clearance: 61.2 mL/min (by C-G formula based on Cr of 1.01). No results for input(s): VANCOTROUGH, VANCOPEAK, VANCORANDOM, GENTTROUGH, GENTPEAK, GENTRANDOM, TOBRATROUGH, TOBRAPEAK, TOBRARND, AMIKACINPEAK, AMIKACINTROU, AMIKACIN in the last 72 hours.   Microbiology: Recent Results (from the past 720 hour(s))  C difficile quick scan w PCR reflex     Status: None   Collection Time: 06/04/15 12:57 PM  Result Value Ref Range Status   C Diff antigen NEGATIVE NEGATIVE Final   C Diff toxin NEGATIVE NEGATIVE Final   C Diff interpretation Negative for toxigenic C. difficile  Final   Assessment: 60 y.o. F presents with abd pain. She continues on zosyn. She is afebrile and WBC is WNL. Scr is stable. CDiff is negative and other cultures are pending.    Zosyn 1/12>>  Goal of Therapy:  Resolution of infection  Plan:  Continue Zosyn 3.375gm IV every 8 hours -each dose over 4 hours Follow-up micro data, labs, vitals. *Pharmacy will sign-off and only follow peripherally as no dose adjustments are anticipated. Thank you for the consult!  Salome Arnt, PharmD, BCPS Pager # [phone] 06/05/2015 8:08 AM

## 2015-06-06 LAB — BASIC METABOLIC PANEL
ANION GAP: 6 (ref 5–15)
BUN: 11 mg/dL (ref 6–20)
CALCIUM: 8.3 mg/dL — AB (ref 8.9–10.3)
CO2: 21 mmol/L — AB (ref 22–32)
Chloride: 111 mmol/L (ref 101–111)
Creatinine, Ser: 0.75 mg/dL (ref 0.44–1.00)
GFR calc non Af Amer: >60 mL/min (ref >60)
Glucose, Bld: 106 mg/dL — ABNORMAL HIGH (ref 65–99)
Potassium: 3.8 mmol/L (ref 3.5–5.1)
SODIUM: 138 mmol/L (ref 135–145)

## 2015-06-06 LAB — CBC
HEMATOCRIT: 33.9 % — AB (ref 36.0–46.0)
HEMOGLOBIN: 11.3 g/dL — AB (ref 12.0–15.0)
MCH: 29.5 pg (ref 26.0–34.0)
MCHC: 33.3 g/dL (ref 30.0–36.0)
MCV: 88.5 fL (ref 78.0–100.0)
Platelets: 202 10*3/uL (ref 150–400)
RBC: 3.83 MIL/uL — ABNORMAL LOW (ref 3.87–5.11)
RDW: 12.9 % (ref 11.5–15.5)
WBC: 5.7 10*3/uL (ref 4.0–10.5)

## 2015-06-06 LAB — GLUCOSE, CAPILLARY
GLUCOSE-CAPILLARY: 112 mg/dL — AB (ref 65–99)
GLUCOSE-CAPILLARY: 92 mg/dL (ref 65–99)

## 2015-06-06 MED ORDER — HYOSCYAMINE SULFATE 0.125 MG SL SUBL
0.1250 mg | SUBLINGUAL_TABLET | Freq: Four times a day (QID) | SUBLINGUAL | Status: DC | PRN
  Administered 2015-06-06 – 2015-06-08 (×3): 0.125 mg via SUBLINGUAL
  Filled 2015-06-06 (×5): qty 1

## 2015-06-06 NOTE — Progress Notes (Signed)
PROGRESS NOTE    [name] [id number] DOB: [birth year] DOA: 06/03/2015 PCP: Laurey Morale, MD  HPI/Brief narrative 60 year old female patient with history of HTN, intracranial aneurysm status post surgery (Dr. Sherwood Gambler), presented to [facility] ED on 06/03/15 with complaints of abdominal pain, nausea, vomiting and diarrhea. She states that she drank some chocolate milk on Sunday night which did not taste good and from Monday started having above symptoms. Denies cyclic contacts or recent antibiotic use. No history of coffee-ground or bloody emesis or blood/mucus or blood in stools. In the ED, CT abdomen and pelvis suggested infectious enteritis with possible ischemia. Admitted for further evaluation. General surgery consulting. Improving.  Assessment/Plan:   Acute gastroenteritis - DD: Food poisoning/infectious or inflammatory enteritis/ischemia.? SBO - Lactate normal - Treating supportively with bowel rest, IV fluids, pain management.  - GI pathogen panel PCR results - neg. C. difficile testing negative. Blood culture 2: Negative to date. DC'ed empirically started Zosyn. -Patient will need elective screening colonoscopy as outpatient at some point-patient advised and indicated that she was planning to get this done through her PCP.  - No further nausea or vomiting. Tolerated clear liquid diet on 1/13. Diarrhea improving in stools developing consistency. Some abdominal cramping/discomfort. Surgery follow-up appreciated-advancing to full liquids and mobilizing.  Hypokalemia - Due to GI losses. Replaced.  Acute kidney injury - Secondary to GI losses and dehydration. Resolved. Continue gentle IV fluids until oral intake improves.  Essential hypertension - Controlled  Mild hyperglycemia - Follow A1c: 5.5  Intracranial aneurysm, status post surgery  Mild anemia - Possibly dilutional. Follow CBC  Hypoglycemia - Seen on CBGs but asymptomatic. Related to no oral intake. Changed IV  fluids to D5NS. Diet being gradually advanced. Follow closely.   DVT prophylaxis: SCDs  Code Status: Full  Family Communication: None at bedside  Disposition Plan: DC home when medically stable, possibly in 1-2 days   Consultants:  Gen Surgery  Procedures:  None  Antimicrobials:  None   Subjective: No further nausea or vomiting. Tolerated clear liquid diet on 1/13. Diarrhea improving in stools developing consistency. Some abdominal cramping/discomfort.   Objective: Filed Vitals:   06/05/15 2130 06/06/15 0136 06/06/15 0539 06/06/15 0920  BP: 120/67 118/70 121/59 114/61  Pulse: 73 63 66 73  Temp: 98.6 F (37 C) 97.9 F (36.6 C) 97.9 F (36.6 C) 98.3 F (36.8 C)  TempSrc: Oral Oral Oral Oral  Resp: 18 18 16 18   Height:      Weight:      SpO2: 98% 96% 96% 99%    Intake/Output Summary (Last 24 hours) at 06/06/15 1329 Last data filed at 06/05/15 1500  Gross per 24 hour  Intake    240 ml  Output      0 ml  Net    240 ml   Filed Weights   06/04/15 1222  Weight: 75.796 kg (167 lb 1.6 oz)    Exam:  General exam: Pleasant middle-aged female sitting up comfortably in chair this morning.  Respiratory system: Clear. No increased work of breathing. Cardiovascular system: S1 & S2 heard, RRR. No JVD, murmurs, gallops, clicks or pedal edema. Gastrointestinal system: Abdomen is nondistended, soft and nontender.Normal bowel sounds heard. Central nervous system: Alert and oriented. No focal neurological deficits. Extremities: Symmetric 5 x 5 power.   Data Reviewed: Basic Metabolic Panel:  Recent Labs Lab 06/03/15 1414 06/04/15 0520 06/05/15 0556 06/06/15 0700  NA 134* 137 139 138  K 3.4* 3.4* 3.6 3.8  CL 100*  109 113* 111  CO2 21* 20* 19* 21*  GLUCOSE 169* 125* 77 106*  BUN 49* 39* 21* 11  CREATININE 1.78* 1.13* 1.01* 0.75  CALCIUM 9.3 7.6* 8.4* 8.3*   Liver Function Tests:  Recent Labs Lab 06/03/15 1414 06/04/15 0520  AST 19 11*  ALT 24 14  ALKPHOS  73 55  BILITOT 1.2 1.5*  PROT 9.3* 6.6  ALBUMIN 4.2 2.8*    Recent Labs Lab 06/03/15 1414  LIPASE 30   No results for input(s): AMMONIA in the last 168 hours. CBC:  Recent Labs Lab 06/03/15 1414 06/04/15 0520 06/05/15 0556 06/06/15 0700  WBC 11.5* 10.0 7.0 5.7  NEUTROABS  --  7.9*  --   --   HGB 14.6 12.1 11.3* 11.3*  HCT 43.8 36.5 34.1* 33.9*  MCV 88.1 86.5 89.0 88.5  PLT 283 225 199 202   Cardiac Enzymes: No results for input(s): CKTOTAL, CKMB, CKMBINDEX, TROPONINI in the last 168 hours. BNP (last 3 results) No results for input(s): PROBNP in the last 8760 hours. CBG:  Recent Labs Lab 06/05/15 0639 06/05/15 1202 06/05/15 1700 06/06/15 0050 06/06/15 0605  GLUCAP 71 71 99 112* 92    Recent Results (from the past 240 hour(s))  Culture, blood (Routine X 2) w Reflex to ID Panel     Status: None (Preliminary result)   Collection Time: 06/03/15 11:37 PM  Result Value Ref Range Status   Specimen Description BLOOD LEFT ARM  Final   Special Requests BOTTLES DRAWN AEROBIC AND ANAEROBIC 10CC  Final   Culture NO GROWTH 1 DAY  Final   Report Status PENDING  Incomplete  Culture, blood (Routine X 2) w Reflex to ID Panel     Status: None (Preliminary result)   Collection Time: 06/03/15 11:45 PM  Result Value Ref Range Status   Specimen Description BLOOD LEFT HAND  Final   Special Requests BOTTLES DRAWN AEROBIC ONLY 6CC  Final   Culture NO GROWTH 1 DAY  Final   Report Status PENDING  Incomplete  C difficile quick scan w PCR reflex     Status: None   Collection Time: 06/04/15 12:57 PM  Result Value Ref Range Status   C Diff antigen NEGATIVE NEGATIVE Final   C Diff toxin NEGATIVE NEGATIVE Final   C Diff interpretation Negative for toxigenic C. difficile  Final  Gastrointestinal Panel by PCR , Stool     Status: None   Collection Time: 06/04/15  3:17 PM  Result Value Ref Range Status   Campylobacter species NOT DETECTED NOT DETECTED Final   Plesimonas shigelloides NOT  DETECTED NOT DETECTED Final   Salmonella species NOT DETECTED NOT DETECTED Final   Yersinia enterocolitica NOT DETECTED NOT DETECTED Final   Vibrio species NOT DETECTED NOT DETECTED Final   Vibrio cholerae NOT DETECTED NOT DETECTED Final   Enteroaggregative E coli (EAEC) NOT DETECTED NOT DETECTED Final   Enteropathogenic E coli (EPEC) NOT DETECTED NOT DETECTED Final   Enterotoxigenic E coli (ETEC) NOT DETECTED NOT DETECTED Final   Shiga like toxin producing E coli (STEC) NOT DETECTED NOT DETECTED Final   E. coli O157 NOT DETECTED NOT DETECTED Final   Shigella/Enteroinvasive E coli (EIEC) NOT DETECTED NOT DETECTED Final   Cryptosporidium NOT DETECTED NOT DETECTED Final   Cyclospora cayetanensis NOT DETECTED NOT DETECTED Final   Entamoeba histolytica NOT DETECTED NOT DETECTED Final   Giardia lamblia NOT DETECTED NOT DETECTED Final   Adenovirus F40/41 NOT DETECTED NOT DETECTED Final  Astrovirus NOT DETECTED NOT DETECTED Final   Norovirus GI/GII NOT DETECTED NOT DETECTED Final   Rotavirus A NOT DETECTED NOT DETECTED Final   Sapovirus (I, II, IV, and V) NOT DETECTED NOT DETECTED Final         Studies: Dg Abd Portable 1v  06/05/2015  CLINICAL DATA:  Abdominal distension.  History of gastroenteritis. EXAM: PORTABLE ABDOMEN - 1 VIEW COMPARISON:  CT scan 06/03/2015 FINDINGS: Scattered air throughout the colon is noted. There are few mildly dilated small bowel loops in the left upper quadrant. The soft tissue shadows are maintained. No worrisome calcifications. IMPRESSION: Improving bowel gas pattern with air now seen throughout the colon. Electronically Signed   By: Marijo Sanes M.D.   On: 06/05/2015 08:13        Scheduled Meds:   Continuous Infusions: . dextrose 5 % and 0.9% NaCl 100 mL/hr at 06/06/15 0547    Principal Problem:   Enteritis Active Problems:   Essential hypertension   Abdominal pain   ARF (acute renal failure) ([place])    Time spent: 25  minutes    Joleigh Mineau, MD, FACP, FHM. [facility] Pager [phone]  If 7PM-7AM, please contact night-coverage [email or web address] Password [email or web address] 06/06/2015, 1:29 PM    LOS: 3 days

## 2015-06-06 NOTE — Progress Notes (Signed)
Patient c/o increased abd. Cramps after walking this afternoon and since increasing to full liquid diet; request MD to be notified; medicated prn; Dr.Hongalgi notified; orders received; continue to monitor.

## 2015-06-06 NOTE — Progress Notes (Signed)
Patient ID: [name], female   DOB: 1955/08/31, 60 y.o.   MRN: [id number]  [facility]  HD#: 4  Subjective: Patient up in chair, taking clear liquid diet.  Mild abdominal pain, but "feels better".  Loose BM's about 4X per day.  Objective: Vital signs in last 24 hours: Temp:  [97.5 F (36.4 C)-98.6 F (37 C)] 97.9 F (36.6 C) (01/14 0539) Pulse Rate:  [63-73] 66 (01/14 0539) Resp:  [16-18] 16 (01/14 0539) BP: (108-123)/(59-70) 121/59 mmHg (01/14 0539) SpO2:  [96 %-98 %] 96 % (01/14 0539) Last BM Date: 06/04/15  Intake/Output from previous day: 01/13 0701 - 01/14 0700 In: 240 [P.O.:240] Out: -  Intake/Output this shift:    Physical Exam: HEENT - sclerae clear, mucous membranes moist Neck - soft Chest - clear bilaterally Cor - RRR Abdomen - moderate distension, somewhat tense; mild diffuse tenderness; BS present Ext - no edema, non-tender Neuro - alert & oriented, no focal deficits  Lab Results:   Recent Labs  06/05/15 0556 06/06/15 0700  WBC 7.0 5.7  HGB 11.3* 11.3*  HCT 34.1* 33.9*  PLT 199 202   BMET  Recent Labs  06/05/15 0556 06/06/15 0700  NA 139 138  K 3.6 3.8  CL 113* 111  CO2 19* 21*  GLUCOSE 77 106*  BUN 21* 11  CREATININE 1.01* 0.75  CALCIUM 8.4* 8.3*   PT/INR No results for input(s): LABPROT, INR in the last 72 hours. Comprehensive Metabolic Panel:    Component Value Date/Time   NA 138 06/06/2015 0700   NA 139 06/05/2015 0556   K 3.8 06/06/2015 0700   K 3.6 06/05/2015 0556   CL 111 06/06/2015 0700   CL 113* 06/05/2015 0556   CO2 21* 06/06/2015 0700   CO2 19* 06/05/2015 0556   BUN 11 06/06/2015 0700   BUN 21* 06/05/2015 0556   CREATININE 0.75 06/06/2015 0700   CREATININE 1.01* 06/05/2015 0556   GLUCOSE 106* 06/06/2015 0700   GLUCOSE 77 06/05/2015 0556   CALCIUM 8.3* 06/06/2015 0700   CALCIUM 8.4* 06/05/2015 0556   AST 11* 06/04/2015 0520   AST 19 06/03/2015 1414   ALT 14 06/04/2015  0520   ALT 24 06/03/2015 1414   ALKPHOS 55 06/04/2015 0520   ALKPHOS 73 06/03/2015 1414   BILITOT 1.5* 06/04/2015 0520   BILITOT 1.2 06/03/2015 1414   PROT 6.6 06/04/2015 0520   PROT 9.3* 06/03/2015 1414   ALBUMIN 2.8* 06/04/2015 0520   ALBUMIN 4.2 06/03/2015 1414    Studies/Results: Dg Abd Portable 1v  06/05/2015  CLINICAL DATA:  Abdominal distension.  History of gastroenteritis. EXAM: PORTABLE ABDOMEN - 1 VIEW COMPARISON:  CT scan 06/03/2015 FINDINGS: Scattered air throughout the colon is noted. There are few mildly dilated small bowel loops in the left upper quadrant. The soft tissue shadows are maintained. No worrisome calcifications. IMPRESSION: Improving bowel gas pattern with air now seen throughout the colon. Electronically Signed   By: Marijo Sanes M.D.   On: 06/05/2015 08:13    Anti-infectives: Anti-infectives    Start     Dose/Rate Route Frequency Ordered Stop   06/04/15 0200  piperacillin-tazobactam (ZOSYN) IVPB 3.375 g  Status:  Discontinued     3.375 g 12.5 mL/hr over 240 Minutes Intravenous Every 8 hours 06/04/15 0040 06/05/15 1559   06/03/15 2215  piperacillin-tazobactam (ZOSYN) IVPB 3.375 g  Status:  Discontinued     3.375 g 12.5 mL/hr over 240 Minutes Intravenous  Once  06/03/15 2213 06/04/15 0039      Assessment & Plans: Suspect enteritis - ?etiology - infectious vs ischemic  Advance to full liquid diet  Encouraged ambulation in halls today  Will follow  WBC normal, lytes OK  No role for operative intervention at present  Earnstine Regal, MD, [facility] Office: [place] 06/06/2015

## 2015-06-07 NOTE — Progress Notes (Signed)
PROGRESS NOTE    [name] [id number] DOB: 1955-08-31 DOA: 06/03/2015 PCP: Laurey Morale, MD  HPI/Brief narrative 60 year old female patient with history of HTN, intracranial aneurysm status post surgery (Dr. Sherwood Gambler), presented to [facility] ED on 06/03/15 with complaints of abdominal pain, nausea, vomiting and diarrhea. She states that she drank some chocolate milk on Sunday night which did not taste good and from Monday started having above symptoms. Denies cyclic contacts or recent antibiotic use. No history of coffee-ground or bloody emesis or blood/mucus or blood in stools. In the ED, CT abdomen and pelvis suggested infectious enteritis with possible ischemia. Admitted for further evaluation. General surgery consulting. Improving slowly.  Assessment/Plan:   Acute gastroenteritis - DD: Food poisoning/infectious or inflammatory enteritis/ischemia.? SBO - Lactate normal - Treating supportively with bowel rest, IV fluids, pain management.  - GI pathogen panel PCR results - neg. C. difficile testing negative. Blood culture 2: Negative to date. DC'ed empirically started Zosyn. -Patient will need elective screening colonoscopy as outpatient at some point-patient advised and indicated that she was planning to get this done through her PCP.  - Advancing diet gradually. Tolerating full liquids. No further nausea or vomiting. Decreasing diarrhea. Still having abdominal discomfort/cramps. Advised to minimize opioids, mobilize - Surgery follow-up appreciated.  Hypokalemia - Due to GI losses. Replaced.  Acute kidney injury - Secondary to GI losses and dehydration. Resolved. Continue gentle IV fluids until oral intake improves.  Essential hypertension - Controlled  Mild hyperglycemia - Follow A1c: 5.5  Intracranial aneurysm, status post surgery  Mild anemia - Stable  Hypoglycemia - Resolved  DVT prophylaxis: SCDs  Code Status: Full  Family Communication: None at bedside    Disposition Plan: DC home when medically stable, possibly 1/16   Consultants:  Gen Surgery  Procedures:  None  Antimicrobials:  None   Subjective: Tolerating full liquids. No further nausea or vomiting. Decreasing diarrhea. Still having abdominal discomfort/cramps.  Objective: Filed Vitals:   06/06/15 2117 06/07/15 0149 06/07/15 0535 06/07/15 0936  BP: 117/67 102/72 109/67 111/62  Pulse: 65 62 62 64  Temp: 98.2 F (36.8 C) 98.1 F (36.7 C) 98.2 F (36.8 C) 98.4 F (36.9 C)  TempSrc: Oral Oral Oral Oral  Resp: 20 18 17 16   Height:      Weight:      SpO2: 98% 98% 98% 100%   No intake or output data in the 24 hours ending 06/07/15 1250 Filed Weights   06/04/15 1222  Weight: 75.796 kg (167 lb 1.6 oz)    Exam:  General exam: Pleasant middle-aged female sitting up comfortably in bed this morning.  Respiratory system: Clear. No increased work of breathing. Cardiovascular system: S1 & S2 heard, RRR. No JVD, murmurs, gallops, clicks or pedal edema. Gastrointestinal system: Abdomen is nondistended, soft and nontender. Normal bowel sounds heard. Central nervous system: Alert and oriented. No focal neurological deficits. Extremities: Symmetric 5 x 5 power.   Data Reviewed: Basic Metabolic Panel:  Recent Labs Lab 06/03/15 1414 06/04/15 0520 06/05/15 0556 06/06/15 0700  NA 134* 137 139 138  K 3.4* 3.4* 3.6 3.8  CL 100* 109 113* 111  CO2 21* 20* 19* 21*  GLUCOSE 169* 125* 77 106*  BUN 49* 39* 21* 11  CREATININE 1.78* 1.13* 1.01* 0.75  CALCIUM 9.3 7.6* 8.4* 8.3*   Liver Function Tests:  Recent Labs Lab 06/03/15 1414 06/04/15 0520  AST 19 11*  ALT 24 14  ALKPHOS 73 55  BILITOT 1.2 1.5*  PROT 9.3*  6.6  ALBUMIN 4.2 2.8*    Recent Labs Lab 06/03/15 1414  LIPASE 30   No results for input(s): AMMONIA in the last 168 hours. CBC:  Recent Labs Lab 06/03/15 1414 06/04/15 0520 06/05/15 0556 06/06/15 0700  WBC 11.5* 10.0 7.0 5.7  NEUTROABS  --   7.9*  --   --   HGB 14.6 12.1 11.3* 11.3*  HCT 43.8 36.5 34.1* 33.9*  MCV 88.1 86.5 89.0 88.5  PLT 283 225 199 202   Cardiac Enzymes: No results for input(s): CKTOTAL, CKMB, CKMBINDEX, TROPONINI in the last 168 hours. BNP (last 3 results) No results for input(s): PROBNP in the last 8760 hours. CBG:  Recent Labs Lab 06/05/15 0639 06/05/15 1202 06/05/15 1700 06/06/15 0050 06/06/15 0605  GLUCAP 71 71 99 112* 92    Recent Results (from the past 240 hour(s))  Culture, blood (Routine X 2) w Reflex to ID Panel     Status: None (Preliminary result)   Collection Time: 06/03/15 11:37 PM  Result Value Ref Range Status   Specimen Description BLOOD LEFT ARM  Final   Special Requests BOTTLES DRAWN AEROBIC AND ANAEROBIC 10CC  Final   Culture NO GROWTH 2 DAYS  Final   Report Status PENDING  Incomplete  Culture, blood (Routine X 2) w Reflex to ID Panel     Status: None (Preliminary result)   Collection Time: 06/03/15 11:45 PM  Result Value Ref Range Status   Specimen Description BLOOD LEFT HAND  Final   Special Requests BOTTLES DRAWN AEROBIC ONLY 6CC  Final   Culture NO GROWTH 2 DAYS  Final   Report Status PENDING  Incomplete  C difficile quick scan w PCR reflex     Status: None   Collection Time: 06/04/15 12:57 PM  Result Value Ref Range Status   C Diff antigen NEGATIVE NEGATIVE Final   C Diff toxin NEGATIVE NEGATIVE Final   C Diff interpretation Negative for toxigenic C. difficile  Final  Gastrointestinal Panel by PCR , Stool     Status: None   Collection Time: 06/04/15  3:17 PM  Result Value Ref Range Status   Campylobacter species NOT DETECTED NOT DETECTED Final   Plesimonas shigelloides NOT DETECTED NOT DETECTED Final   Salmonella species NOT DETECTED NOT DETECTED Final   Yersinia enterocolitica NOT DETECTED NOT DETECTED Final   Vibrio species NOT DETECTED NOT DETECTED Final   Vibrio cholerae NOT DETECTED NOT DETECTED Final   Enteroaggregative E coli (EAEC) NOT DETECTED NOT  DETECTED Final   Enteropathogenic E coli (EPEC) NOT DETECTED NOT DETECTED Final   Enterotoxigenic E coli (ETEC) NOT DETECTED NOT DETECTED Final   Shiga like toxin producing E coli (STEC) NOT DETECTED NOT DETECTED Final   E. coli O157 NOT DETECTED NOT DETECTED Final   Shigella/Enteroinvasive E coli (EIEC) NOT DETECTED NOT DETECTED Final   Cryptosporidium NOT DETECTED NOT DETECTED Final   Cyclospora cayetanensis NOT DETECTED NOT DETECTED Final   Entamoeba histolytica NOT DETECTED NOT DETECTED Final   Giardia lamblia NOT DETECTED NOT DETECTED Final   Adenovirus F40/41 NOT DETECTED NOT DETECTED Final   Astrovirus NOT DETECTED NOT DETECTED Final   Norovirus GI/GII NOT DETECTED NOT DETECTED Final   Rotavirus A NOT DETECTED NOT DETECTED Final   Sapovirus (I, II, IV, and V) NOT DETECTED NOT DETECTED Final         Studies: No results found.      Scheduled Meds:   Continuous Infusions: . dextrose 5 % and  0.9% NaCl 50 mL/hr at 06/06/15 1841    Principal Problem:   Enteritis Active Problems:   Essential hypertension   Abdominal pain   ARF (acute renal failure) ([place])    Time spent: 25 minutes    Draycen Leichter, MD, FACP, FHM. [facility] Pager [phone]  If 7PM-7AM, please contact night-coverage [email or web address] Password [email or web address] 06/07/2015, 12:50 PM    LOS: 4 days

## 2015-06-07 NOTE — Progress Notes (Signed)
Patient ID: [name], female   DOB: [birth year], 60 y.o.   MRN: [id number]  [facility]  HD#: 5  Subjective: Patient up in chair, having full liquids for breakfast.  Improved since yesterday - less cramping, less pain.  Passing some flatus, some small loose BM's.  Objective: Vital signs in last 24 hours: Temp:  [98.1 F (36.7 C)-98.4 F (36.9 C)] 98.2 F (36.8 C) (01/15 0535) Pulse Rate:  [62-73] 62 (01/15 0535) Resp:  [17-20] 17 (01/15 0535) BP: (102-119)/(61-75) 109/67 mmHg (01/15 0535) SpO2:  [98 %-100 %] 98 % (01/15 0535) Last BM Date: 06/06/15  Intake/Output from previous day:   Intake/Output this shift:    Physical Exam: HEENT - sclerae clear, mucous membranes moist Neck - soft Chest - clear bilaterally Cor - RRR Abdomen - soft, minimal distension; rare BS present; no tenderness, no mass, no guarding Ext - no edema, non-tender Neuro - alert & oriented, no focal deficits  Lab Results:   Recent Labs  06/05/15 0556 06/06/15 0700  WBC 7.0 5.7  HGB 11.3* 11.3*  HCT 34.1* 33.9*  PLT 199 202   BMET  Recent Labs  06/05/15 0556 06/06/15 0700  NA 139 138  K 3.6 3.8  CL 113* 111  CO2 19* 21*  GLUCOSE 77 106*  BUN 21* 11  CREATININE 1.01* 0.75  CALCIUM 8.4* 8.3*   PT/INR No results for input(s): LABPROT, INR in the last 72 hours. Comprehensive Metabolic Panel:    Component Value Date/Time   NA 138 06/06/2015 0700   NA 139 06/05/2015 0556   K 3.8 06/06/2015 0700   K 3.6 06/05/2015 0556   CL 111 06/06/2015 0700   CL 113* 06/05/2015 0556   CO2 21* 06/06/2015 0700   CO2 19* 06/05/2015 0556   BUN 11 06/06/2015 0700   BUN 21* 06/05/2015 0556   CREATININE 0.75 06/06/2015 0700   CREATININE 1.01* 06/05/2015 0556   GLUCOSE 106* 06/06/2015 0700   GLUCOSE 77 06/05/2015 0556   CALCIUM 8.3* 06/06/2015 0700   CALCIUM 8.4* 06/05/2015 0556   AST 11* 06/04/2015 0520   AST 19 06/03/2015 1414   ALT 14 06/04/2015 0520    ALT 24 06/03/2015 1414   ALKPHOS 55 06/04/2015 0520   ALKPHOS 73 06/03/2015 1414   BILITOT 1.5* 06/04/2015 0520   BILITOT 1.2 06/03/2015 1414   PROT 6.6 06/04/2015 0520   PROT 9.3* 06/03/2015 1414   ALBUMIN 2.8* 06/04/2015 0520   ALBUMIN 4.2 06/03/2015 1414    Studies/Results: No results found.  Anti-infectives: Anti-infectives    Start     Dose/Rate Route Frequency Ordered Stop   06/04/15 0200  piperacillin-tazobactam (ZOSYN) IVPB 3.375 g  Status:  Discontinued     3.375 g 12.5 mL/hr over 240 Minutes Intravenous Every 8 hours 06/04/15 0040 06/05/15 1559   06/03/15 2215  piperacillin-tazobactam (ZOSYN) IVPB 3.375 g  Status:  Discontinued     3.375 g 12.5 mL/hr over 240 Minutes Intravenous  Once 06/03/15 2213 06/04/15 0039      Assessment & Plans: Suspect enteritis - ?etiology - infectious vs ischemic Continue full liquid diet Encouraged ambulation in halls today Will follow WBC normal, lytes OK No role for operative intervention at present  Abdomen relatively quiet - ?persistent ileus symptoms  Earnstine Regal, MD, [facility] Office: [place] 06/07/2015

## 2015-06-08 MED ORDER — SIMETHICONE 80 MG PO CHEW: 80.0000 mg | CHEWABLE_TABLET | Freq: Four times a day (QID) | ORAL | Status: DC | PRN

## 2015-06-08 MED ORDER — HYOSCYAMINE SULFATE 0.125 MG SL SUBL: 0.1250 mg | SUBLINGUAL_TABLET | Freq: Four times a day (QID) | SUBLINGUAL | Status: DC | PRN

## 2015-06-08 NOTE — Care Management Note (Signed)
Case Management Note  Patient Details  Name: [name] MRN: [id number] Date of Birth: 1956/01/11  Subjective/Objective:                    Action/Plan: Plan is for patient to discharge home today with self care. No further needs per CM.   Expected Discharge Date:                  Expected Discharge Plan:  Home/Self Care  In-House Referral:     Discharge planning Services     Post Acute Care Choice:    Choice offered to:     DME Arranged:    DME Agency:     HH Arranged:    [place] Agency:     Status of Service:  Completed, signed off  Medicare Important Message Given:    Date Medicare IM Given:    Medicare IM give by:    Date Additional Medicare IM Given:    Additional Medicare Important Message give by:     If discussed at [place] of Stay Meetings, dates discussed:    Additional Comments:  Pollie Friar, RN 06/08/2015, 3:08 PM

## 2015-06-08 NOTE — Progress Notes (Signed)
Patient ready for discharge; discharge instructions given and reviewed;Rx's sent electronically; daughter has arrived to take patient home. Patient choose to ambulate out for discharge; all belongings returned.

## 2015-06-08 NOTE — Progress Notes (Signed)
Patient ID: [name], female   DOB: Aug 12, 1955, 60 y.o.   MRN: [id number]    Subjective: Pt feels well this morning.  No further pain.  Tolerating full liquids.  Stools are starting to become more solid in nature  Objective: Vital signs in last 24 hours: Temp:  [98 F (36.7 C)-98.4 F (36.9 C)] 98.2 F (36.8 C) (01/16 0514) Pulse Rate:  [54-91] 54 (01/16 0514) Resp:  [17-20] 18 (01/16 0514) BP: (111-132)/(57-110) 113/57 mmHg (01/16 0514) SpO2:  [96 %-100 %] 99 % (01/16 0514) Last BM Date: 06/06/15  Intake/Output from previous day:   Intake/Output this shift:    PE: Abd: soft, Nt, Nd, +BS  Lab Results:   Recent Labs  06/06/15 0700  WBC 5.7  HGB 11.3*  HCT 33.9*  PLT 202   BMET  Recent Labs  06/06/15 0700  NA 138  K 3.8  CL 111  CO2 21*  GLUCOSE 106*  BUN 11  CREATININE 0.75  CALCIUM 8.3*   PT/INR No results for input(s): LABPROT, INR in the last 72 hours. CMP     Component Value Date/Time   NA 138 06/06/2015 0700   K 3.8 06/06/2015 0700   CL 111 06/06/2015 0700   CO2 21* 06/06/2015 0700   GLUCOSE 106* 06/06/2015 0700   BUN 11 06/06/2015 0700   CREATININE 0.75 06/06/2015 0700   CALCIUM 8.3* 06/06/2015 0700   PROT 6.6 06/04/2015 0520   ALBUMIN 2.8* 06/04/2015 0520   AST 11* 06/04/2015 0520   ALT 14 06/04/2015 0520   ALKPHOS 55 06/04/2015 0520   BILITOT 1.5* 06/04/2015 0520   GFRNONAA >60 06/06/2015 0700   GFRAA >60 06/06/2015 0700   Lipase     Component Value Date/Time   LIPASE 30 06/03/2015 1414       Studies/Results: No results found.  Anti-infectives: Anti-infectives    Start     Dose/Rate Route Frequency Ordered Stop   06/04/15 0200  piperacillin-tazobactam (ZOSYN) IVPB 3.375 g  Status:  Discontinued     3.375 g 12.5 mL/hr over 240 Minutes Intravenous Every 8 hours 06/04/15 0040 06/05/15 1559   06/03/15 2215  piperacillin-tazobactam (ZOSYN) IVPB 3.375 g  Status:  Discontinued     3.375 g 12.5 mL/hr over 240 Minutes  Intravenous  Once 06/03/15 2213 06/04/15 0039       Assessment/Plan  1. Enteritis with PVG and pneumatosis  -improving -agree with soft diet and ok for Dc home this afternoon if tolerates this -no surgical intervention -needs to follow up with GI as an outpatient.   LOS: 5 days    Kohlton Gilpatrick E 06/08/2015, 10:21 AM Pager: [phone]

## 2015-06-08 NOTE — Progress Notes (Signed)
Diet Education Note  Spoke with pt at bedside about diet choices for enteritis. Pt reported no specific triggers for enteritis, but was concerned with food choices during discharge. Informed patient to avoid fibrous vegetables, and eat whatever does not cause abd pain. Pt will drink whole milk to get calories when she is not hungry.  No further RD interventions warranted. Satira Anis. Latrease Kunde, MS, RD LDN After Hours/Weekend Pager [phone]

## 2015-06-08 NOTE — Plan of Care (Signed)
Problem: Food- and Nutrition-Related Knowledge Deficit (NB-1.1) Goal: Nutrition education Formal process to instruct or train a patient/client in a skill or to impart knowledge to help patients/clients voluntarily manage or modify food choices and eating behavior to maintain or improve health.  Outcome: Completed/Met Date Met:  06/08/15 Diet Education Note  Spoke with pt at bedside about diet choices for enteritis. Pt reported no specific triggers for enteritis, but was concerned with food choices during discharge. Informed patient to avoid fibrous vegetables, and eat whatever does not cause abd pain. Pt will drink whole milk to get calories when she is not hungry.  No further RD interventions warranted. [name]. Kristin Pavone, MS, RD LDN After Hours/Weekend Pager [phone]

## 2015-06-08 NOTE — Discharge Summary (Signed)
Physician Discharge Summary  [name] [id number] DOB: 05/16/1956 DOA: 06/03/2015  PCP: Laurey Morale, MD  Admit date: 06/03/2015 Discharge date: 06/08/2015  Time spent: Greater than 30 minutes  Recommendations for Outpatient Follow-up:  1. Dr. Alysia Penna, PCP in 3 days with repeat labs (CBC & BMP). Please follow final blood culture results that were sent from the hospital. 2. Recommend outpatient GI consultation for screening colonoscopy and earlier if she has persistent or recurrent symptoms.  Discharge Diagnoses:  Principal Problem:   Enteritis Active Problems:   Essential hypertension   Abdominal pain   ARF (acute renal failure) ([place])   Discharge Condition: Improved & Stable  Diet recommendation: Soft diet.  Filed Weights   06/04/15 1222  Weight: 75.796 kg (167 lb 1.6 oz)    History of present illness:  60 year old female patient with history of HTN, intracranial aneurysm status post surgery (Dr. Sherwood Gambler), presented to [facility] ED on 06/03/15 with complaints of abdominal pain, nausea, vomiting and diarrhea. She states that she drank some chocolate milk on Sunday night which did not taste good and from Monday started having above symptoms. Denies cyclic contacts or recent antibiotic use. No history of coffee-ground or bloody emesis or blood/mucus or blood in stools. In the ED, CT abdomen and pelvis suggested infectious enteritis with possible ischemia. Admitted for further evaluation. General surgery consulting. Improved gradually.[name]  Hospital Course:   Acute gastroenteritis - DD: Food poisoning/infectious or inflammatory enteritis/ischemia.? SBO - Lactate normal - Treaed supportively with bowel rest, IV fluids, pain management.  - GI pathogen panel PCR results - neg. C. difficile testing negative. Blood culture 2: Negative to date. DC'ed empirically started Zosyn. -Patient will need elective screening colonoscopy as outpatient at some point-patient advised and  indicated that she was planning to get this done through her PCP.  - Diet was gradually advanced. No further nausea or vomiting. Diarrhea has significantly improved. Passing lots of flatus. Abdominal cramps has minimized/intermittent. Tolerated soft diet. Ambulating in the halls. - Surgeons continued to follow during course of hospitalization  Hypokalemia - Due to GI losses. Replaced.  Acute kidney injury - Secondary to GI losses and dehydration. Resolved. .  Essential hypertension - Controlled. Antihypertensives were held and will be resumed at discharge.  Mild hyperglycemia - Follow A1c: 5.5  Intracranial aneurysm, status post surgery  Mild anemia - Stable  Hypoglycemia - Resolved    Consultants:  Gen Surgery  Procedures:  None    Discharge Exam:  Complaints:  Continues to feel better. No nausea or vomiting. Tolerated soft diet - ate 50%. Abdominal cramping has significantly decreased. Minimal and intermittent. Passing lots of flatus. Diarrhea has improved-decreased frequency and volume. More formed/soft.  Filed Vitals:   06/07/15 2120 06/08/15 0112 06/08/15 0514 06/08/15 1041  BP: 120/72 125/66 113/57 117/69  Pulse: 64 59 54 66  Temp: 98.4 F (36.9 C) 98 F (36.7 C) 98.2 F (36.8 C)   TempSrc: Oral Oral Oral   Resp: 20 18 18 20   Height:      Weight:      SpO2: 99% 100% 99% 99%    General exam: Pleasant middle-aged female sitting up comfortably in chair this morning.  Respiratory system: Clear. No increased work of breathing. Cardiovascular system: S1 & S2 heard, RRR. No JVD, murmurs, gallops, clicks or pedal edema. Gastrointestinal system: Abdomen is nondistended, soft and nontender. Normal bowel sounds heard. Central nervous system: Alert and oriented. No focal neurological deficits. Extremities: Symmetric 5 x 5 power.  Discharge  Instructions      Discharge Instructions    Call MD for:  difficulty breathing, headache or visual disturbances     Complete by:  As directed      Call MD for:  extreme fatigue    Complete by:  As directed      Call MD for:  persistant dizziness or light-headedness    Complete by:  As directed      Call MD for:  persistant nausea and vomiting    Complete by:  As directed      Call MD for:  severe uncontrolled pain    Complete by:  As directed      Call MD for:  temperature >100.4    Complete by:  As directed      Discharge instructions    Complete by:  As directed   Soft diet.     Increase activity slowly    Complete by:  As directed             Medication List    STOP taking these medications        anti-nausea solution     dimenhyDRINATE 50 MG tablet  Commonly known as:  DRAMAMINE      TAKE these medications        acetaminophen 500 MG tablet  Commonly known as:  TYLENOL  Take 1,000 mg by mouth every 6 (six) hours as needed for moderate pain.     hyoscyamine 0.125 MG SL tablet  Commonly known as:  LEVSIN SL  Place 1 tablet (0.125 mg total) under the tongue every 6 (six) hours as needed for cramping.     NIFEdipine 90 MG 24 hr tablet  Commonly known as:  ADALAT CC  TAKE 1 TABLET BY MOUTH EVERY DAY     simethicone 80 MG chewable tablet  Commonly known as:  MYLICON  Chew 1 tablet (80 mg total) by mouth 4 (four) times daily as needed for flatulence.     zolpidem 10 MG tablet  Commonly known as:  AMBIEN  TAKE 1 TABLET BY MOUTH AT BEDTIME       Follow-up Information    Follow up with FRY,STEPHEN A, MD. Schedule an appointment as soon as possible for a visit in 3 days.   Specialty:  Family Medicine   Why:  To be seen with repeat labs (CBC & BMP). Recommend outpatient gastroenterology consultation.   Contact information:   [address] [place] [id number] [phone]        The results of significant diagnostics from this hospitalization (including imaging, microbiology, ancillary and laboratory) are listed below for reference.    Significant Diagnostic  Studies: Ct Abdomen Pelvis Wo Contrast  06/03/2015  CLINICAL DATA:  60 year old female with upper abdominal pain radiating to the back. Nausea vomiting and diarrhea. EXAM: CT ABDOMEN AND PELVIS WITHOUT CONTRAST TECHNIQUE: Multidetector CT imaging of the abdomen and pelvis was performed following the standard protocol without IV contrast. COMPARISON:  CT dated 02/04/2011 FINDINGS: Evaluation of this exam is limited in the absence of intravenous contrast. The visualized lung bases are clear. No intra-abdominal free air. Trace free fluid within the pelvis. Air noted within the left lobe of the liver compatible with portal venous gas. The gallbladder, pancreas, spleen, and adrenal glands appear unremarkable. There is no hydronephrosis or nephrolithiasis. Visualized ureters and urinary bladder appear unremarkable. Hysterectomy. Oral contrast noted within the stomach and multiple loops of proximal and mid small bowel. There is diffuse  inflammatory changes and dilatation of the small bowel both involving the proximal loops as well as the distal loops within the pelvis. There is a segment of small bowel in the left hemiabdomen with thickened and inflamed wall and small pockets of peripheral and mural gas concerning for pneumatosis. The small bowel loops measure up to 4.2 cm in diameter. The terminal ileum and colon are decompressed. Findings may be related to an infectious enteritis or represent an inflammatory bowel disease with stricture of terminal ileum. An obstruction at the level of the terminal ileum is not excluded. There is aortoiliac atherosclerotic disease. There is diffuse mesenteric stranding. No adenopathy. The abdominal wall soft tissues appear unremarkable. There is degenerative changes of the spine. No acute fracture. IMPRESSION: Diffuse inflammatory changes and dilatation of the small bowel with segmental wall thickening and pneumatosis of the small bowel in the left hemiabdomen. Intrahepatic portal  venous gas identified. Findings compatible with an inflammatory/infectious enteritis with a degree of ischemia. The terminal ileum is collapsed. A degree of obstruction at the level of the terminal ileum is not excluded. Clinical correlation and surgical consult is advised. Critical Value/emergent results were called by telephone at the time of interpretation on 06/03/2015 at 9:46 pm to Dr. Winfred Leeds, who verbally acknowledged these results. Electronically Signed   By: Anner Crete M.D.   On: 06/03/2015 21:48   Dg Abd Portable 1v  06/05/2015  CLINICAL DATA:  Abdominal distension.  History of gastroenteritis. EXAM: PORTABLE ABDOMEN - 1 VIEW COMPARISON:  CT scan 06/03/2015 FINDINGS: Scattered air throughout the colon is noted. There are few mildly dilated small bowel loops in the left upper quadrant. The soft tissue shadows are maintained. No worrisome calcifications. IMPRESSION: Improving bowel gas pattern with air now seen throughout the colon. Electronically Signed   By: Marijo Sanes M.D.   On: 06/05/2015 08:13    Microbiology: Recent Results (from the past 240 hour(s))  Culture, blood (Routine X 2) w Reflex to ID Panel     Status: None (Preliminary result)   Collection Time: 06/03/15 11:37 PM  Result Value Ref Range Status   Specimen Description BLOOD LEFT ARM  Final   Special Requests BOTTLES DRAWN AEROBIC AND ANAEROBIC 10CC  Final   Culture NO GROWTH 4 DAYS  Final   Report Status PENDING  Incomplete  Culture, blood (Routine X 2) w Reflex to ID Panel     Status: None (Preliminary result)   Collection Time: 06/03/15 11:45 PM  Result Value Ref Range Status   Specimen Description BLOOD LEFT HAND  Final   Special Requests BOTTLES DRAWN AEROBIC ONLY 6CC  Final   Culture NO GROWTH 4 DAYS  Final   Report Status PENDING  Incomplete  C difficile quick scan w PCR reflex     Status: None   Collection Time: 06/04/15 12:57 PM  Result Value Ref Range Status   C Diff antigen NEGATIVE NEGATIVE Final    C Diff toxin NEGATIVE NEGATIVE Final   C Diff interpretation Negative for toxigenic C. difficile  Final  Gastrointestinal Panel by PCR , Stool     Status: None   Collection Time: 06/04/15  3:17 PM  Result Value Ref Range Status   Campylobacter species NOT DETECTED NOT DETECTED Final   Plesimonas shigelloides NOT DETECTED NOT DETECTED Final   Salmonella species NOT DETECTED NOT DETECTED Final   Yersinia enterocolitica NOT DETECTED NOT DETECTED Final   Vibrio species NOT DETECTED NOT DETECTED Final   Vibrio cholerae NOT DETECTED  NOT DETECTED Final   Enteroaggregative E coli (EAEC) NOT DETECTED NOT DETECTED Final   Enteropathogenic E coli (EPEC) NOT DETECTED NOT DETECTED Final   Enterotoxigenic E coli (ETEC) NOT DETECTED NOT DETECTED Final   Shiga like toxin producing E coli (STEC) NOT DETECTED NOT DETECTED Final   E. coli O157 NOT DETECTED NOT DETECTED Final   Shigella/Enteroinvasive E coli (EIEC) NOT DETECTED NOT DETECTED Final   Cryptosporidium NOT DETECTED NOT DETECTED Final   Cyclospora cayetanensis NOT DETECTED NOT DETECTED Final   Entamoeba histolytica NOT DETECTED NOT DETECTED Final   Giardia lamblia NOT DETECTED NOT DETECTED Final   Adenovirus F40/41 NOT DETECTED NOT DETECTED Final   Astrovirus NOT DETECTED NOT DETECTED Final   Norovirus GI/GII NOT DETECTED NOT DETECTED Final   Rotavirus A NOT DETECTED NOT DETECTED Final   Sapovirus (I, II, IV, and V) NOT DETECTED NOT DETECTED Final     Labs: Basic Metabolic Panel:  Recent Labs Lab 06/03/15 1414 06/04/15 0520 06/05/15 0556 06/06/15 0700  NA 134* 137 139 138  K 3.4* 3.4* 3.6 3.8  CL 100* 109 113* 111  CO2 21* 20* 19* 21*  GLUCOSE 169* 125* 77 106*  BUN 49* 39* 21* 11  CREATININE 1.78* 1.13* 1.01* 0.75  CALCIUM 9.3 7.6* 8.4* 8.3*   Liver Function Tests:  Recent Labs Lab 06/03/15 1414 06/04/15 0520  AST 19 11*  ALT 24 14  ALKPHOS 73 55  BILITOT 1.2 1.5*  PROT 9.3* 6.6  ALBUMIN 4.2 2.8*    Recent  Labs Lab 06/03/15 1414  LIPASE 30   No results for input(s): AMMONIA in the last 168 hours. CBC:  Recent Labs Lab 06/03/15 1414 06/04/15 0520 06/05/15 0556 06/06/15 0700  WBC 11.5* 10.0 7.0 5.7  NEUTROABS  --  7.9*  --   --   HGB 14.6 12.1 11.3* 11.3*  HCT 43.8 36.5 34.1* 33.9*  MCV 88.1 86.5 89.0 88.5  PLT 283 225 199 202   Cardiac Enzymes: No results for input(s): CKTOTAL, CKMB, CKMBINDEX, TROPONINI in the last 168 hours. BNP: BNP (last 3 results) No results for input(s): BNP in the last 8760 hours.  ProBNP (last 3 results) No results for input(s): PROBNP in the last 8760 hours.  CBG:  Recent Labs Lab 06/05/15 0639 06/05/15 1202 06/05/15 1700 06/06/15 0050 06/06/15 0605  GLUCAP 71 71 99 112* 92       Signed:  Vernell Leep, MD, FACP, FHM. [facility] Pager [phone]  If 7PM-7AM, please contact night-coverage [email or web address] Password [email or web address] 06/08/2015, 2:14 PM

## 2015-06-08 NOTE — Discharge Instructions (Signed)

## 2015-06-09 ENCOUNTER — Telehealth: Payer: Self-pay | Admitting: Family Medicine

## 2015-06-09 LAB — CULTURE, BLOOD (ROUTINE X 2)
CULTURE: NO GROWTH
CULTURE: NO GROWTH

## 2015-06-09 NOTE — Telephone Encounter (Signed)
Pt has been scheduled.  °

## 2015-06-09 NOTE — Telephone Encounter (Signed)
Per Dr. Sarajane Jews okay to schedule.

## 2015-06-09 NOTE — Telephone Encounter (Signed)
Pt has hospital  follow up appt on friday, but would like to come in wed. Pt threw up last night and needs a referral to GI dr.  Dorene Ar dr write her out through Friday, but would like sooner. OK to schedule? Only Same day appts left.

## 2015-06-10 ENCOUNTER — Ambulatory Visit (INDEPENDENT_AMBULATORY_CARE_PROVIDER_SITE_OTHER): Payer: BC Managed Care – PPO | Admitting: Family Medicine

## 2015-06-10 ENCOUNTER — Encounter: Payer: Self-pay | Admitting: Family Medicine

## 2015-06-10 VITALS — BP 110/71 | HR 89 | Temp 99.1°F | Ht 68.0 in | Wt 155.0 lb

## 2015-06-10 DIAGNOSIS — K529 Noninfective gastroenteritis and colitis, unspecified: Secondary | ICD-10-CM | POA: Diagnosis not present

## 2015-06-10 DIAGNOSIS — Z0000 Encounter for general adult medical examination without abnormal findings: Secondary | ICD-10-CM

## 2015-06-10 NOTE — Progress Notes (Signed)
Pre visit review using our clinic review tool, if applicable. No additional management support is needed unless otherwise documented below in the visit note. 

## 2015-06-11 ENCOUNTER — Encounter: Payer: Self-pay | Admitting: Internal Medicine

## 2015-06-11 ENCOUNTER — Encounter: Payer: Self-pay | Admitting: Family Medicine

## 2015-06-11 LAB — CBC WITH DIFFERENTIAL/PLATELET
Basophils Absolute: 0.1 10*3/uL (ref 0.0–0.1)
Basophils Relative: 0.9 % (ref 0.0–3.0)
EOS ABS: 0.2 10*3/uL (ref 0.0–0.7)
Eosinophils Relative: 2.4 % (ref 0.0–5.0)
HCT: 41.8 % (ref 36.0–46.0)
HEMOGLOBIN: 13.6 g/dL (ref 12.0–15.0)
Lymphocytes Relative: 20.7 % (ref 12.0–46.0)
Lymphs Abs: 1.6 10*3/uL (ref 0.7–4.0)
MCHC: 32.6 g/dL (ref 30.0–36.0)
MCV: 88.8 fl (ref 78.0–100.0)
MONO ABS: 0.7 10*3/uL (ref 0.1–1.0)
Monocytes Relative: 9.1 % (ref 3.0–12.0)
Neutro Abs: 5.3 10*3/uL (ref 1.4–7.7)
Neutrophils Relative %: 66.9 % (ref 43.0–77.0)
Platelets: 354 10*3/uL (ref 150.0–400.0)
RBC: 4.71 Mil/uL (ref 3.87–5.11)
RDW: 12.9 % (ref 11.5–15.5)
WBC: 7.9 10*3/uL (ref 4.0–10.5)

## 2015-06-11 LAB — BASIC METABOLIC PANEL
BUN: 12 mg/dL (ref 6–23)
CALCIUM: 10 mg/dL (ref 8.4–10.5)
CO2: 25 mEq/L (ref 19–32)
CREATININE: 0.92 mg/dL (ref 0.40–1.20)
Chloride: 106 mEq/L (ref 96–112)
GFR: 66.46 mL/min (ref >60.00)
Glucose, Bld: 116 mg/dL — ABNORMAL HIGH (ref 70–99)
Potassium: 5.1 mEq/L (ref 3.5–5.1)
Sodium: 142 mEq/L (ref 135–145)

## 2015-06-11 NOTE — Progress Notes (Signed)
   Subjective:    Patient ID: [name], female    DOB: 1955/12/18, 60 y.o.   MRN: [id number]  HPI Here to follow up a hospital stay from 1-11-7 to 06-08-15 for an enteritis causing abdominal pains, diarrhea, and fever. Her WBC nevr got above 11.5 and was down to 7.0 on DC. All cultures remained negative. Her CT scan revealed diffuse inflammation of the small bowel consistent with either infectious or inflammatory causes. She was NPO for a few days but has been able to gradually advance her diet. She is now eating soups primarily. The diarrhea has stopped and she has not had a BM for the past 3 days. No fever. She never had nausea or vomiting.    Review of Systems  Constitutional: Negative.   Respiratory: Negative.   Cardiovascular: Negative.   Gastrointestinal: Negative.   Genitourinary: Negative.   Neurological: Negative.        Objective:   Physical Exam  Constitutional: She appears well-developed and well-nourished. No distress.  Neck: No thyromegaly present.  Cardiovascular: Normal rate, regular rhythm, normal heart sounds and intact distal pulses.   Pulmonary/Chest: Effort normal and breath sounds normal.  Abdominal: Soft. Bowel sounds are normal. She exhibits no distension and no mass. There is no rebound and no guarding.  Very slight tenderness diffusely   Lymphadenopathy:    She has no cervical adenopathy.          Assessment & Plan:  She is recovering from an enteritis that appears to have been viral. She will advance her diet as tolerated. We will check a BMET and CBC today. We will arrange for a colonoscopy soon as recommended.

## 2015-06-12 ENCOUNTER — Ambulatory Visit: Payer: BC Managed Care – PPO | Admitting: Family Medicine

## 2015-06-16 ENCOUNTER — Telehealth: Payer: Self-pay | Admitting: Family Medicine

## 2015-06-16 NOTE — Telephone Encounter (Signed)
Duplicate note, see lab result note. Pt was notified of results.

## 2015-06-16 NOTE — Telephone Encounter (Signed)
Pt would like a call back about lab results ..  °

## 2015-07-27 ENCOUNTER — Other Ambulatory Visit: Payer: Self-pay

## 2015-07-27 DIAGNOSIS — Z1231 Encounter for screening mammogram for malignant neoplasm of breast: Secondary | ICD-10-CM

## 2015-08-04 ENCOUNTER — Ambulatory Visit (INDEPENDENT_AMBULATORY_CARE_PROVIDER_SITE_OTHER): Payer: BC Managed Care – PPO | Admitting: Internal Medicine

## 2015-08-04 ENCOUNTER — Encounter: Payer: Self-pay | Admitting: Internal Medicine

## 2015-08-04 VITALS — BP 106/66 | HR 80 | Ht 66.75 in | Wt 157.4 lb

## 2015-08-04 DIAGNOSIS — R935 Abnormal findings on diagnostic imaging of other abdominal regions, including retroperitoneum: Secondary | ICD-10-CM | POA: Diagnosis not present

## 2015-08-04 DIAGNOSIS — A09 Infectious gastroenteritis and colitis, unspecified: Secondary | ICD-10-CM

## 2015-08-04 DIAGNOSIS — Z1211 Encounter for screening for malignant neoplasm of colon: Secondary | ICD-10-CM

## 2015-08-04 MED ORDER — NA SULFATE-K SULFATE-MG SULF 17.5-3.13-1.6 GM/177ML PO SOLN
1.0000 | Freq: Once | ORAL | Status: DC
Start: 2015-08-04 — End: 2015-10-01

## 2015-08-04 NOTE — Progress Notes (Signed)
HISTORY OF PRESENT ILLNESS:  [name] is a 60 y.o. female [place] who is referred by Dr. Alysia Penna regarding recent problems with a chief complaint of acute gastrointestinal illness and abdominal imaging abnormalities. Patient has not had prior GI evaluations. She reports being in her usual state of health until mid January when she developed persistent problems with nausea, vomiting, abdominal pain and significant diarrhea. She was admitted to the hospital 06/04/2015. She was dehydrated. Laboratories at that time were remarkable for potassium of 3.4, BUN 39, creatinine 1.13, albumin 2.8, white blood cell count 10.0, and hemoglobin 12.1. Neutrophils are slightly elevated. Eosinophils were 0. Abdominal imaging was remarkable for diffuse inflammatory changes and dilation of the small bowel with segmental wall thickening and pneumatosis in the left hemiabdomen. Findings were felt to be compatible with inflammatory/infectious enteritis with a degree of ischemia. The terminal ileum was collapsed. She was admitted, hydrated, placed on antibiotics, and seen by surgery. She was not felt to have a surgical abdomen. Eventually discharged home feeling better 06/08/2015. After discharge the patient has done well. No further problems with GI symptoms. Good appetite and stable weight. Of interest, the patient reports a similar illness September 2012. Review of imaging from that time revealed segmental thickening of the ileum. Patient denies having had problems with swelling of the lips or rash. She takes no medications aside from those listed on her MAR. Reports absolutely no symptoms in between these episodes. GI review of systems is currently negative. She does need screening colonoscopy and is interested.  REVIEW OF SYSTEMS:  All non-GI ROS negative except for occasional headache  Past Medical History  Diagnosis Date  . Hypertension   . Headache(784.0)   . Insomnia   . Acute renal  failure (ARF) ([place])   . Intracranial aneurysm     status post surgery    Past Surgical History  Procedure Laterality Date  . Abdominal hysterectomy    . Cerebral aneurysm repair  1998 bt dr Sherwood Gambler  . Ovarian cyst removal      Social History [name]  reports that she has never smoked. She has never used smokeless tobacco. She reports that she drinks alcohol. She reports that she does not use illicit drugs.  family history includes Aneurysm in her father and maternal uncle; Anuerysm in her sister; Arthritis in her mother; Cholelithiasis in her daughter and mother; Hypertension in her mother; Scoliosis in her mother; Sudden death in her father.  No Known Allergies     PHYSICAL EXAMINATION: Vital signs: BP 106/66 mmHg  Pulse 80  Ht 5' 6.75" (1.695 m)  Wt 157 lb 6 oz (71.385 kg)  BMI 24.85 kg/m2  Constitutional: generally well-appearing, no acute distress Psychiatric: alert and oriented x3, cooperative Eyes: extraocular movements intact, anicteric, conjunctiva pink Mouth: oral pharynx moist, no lesions Neck: supple no lymphadenopathy Cardiovascular: heart regular rate and rhythm, no murmur Lungs: clear to auscultation bilaterally Abdomen: soft, nontender, nondistended, no obvious ascites, no peritoneal signs, normal bowel sounds, no organomegaly Rectal:Deferred until colonoscopy Extremities: no clubbing cyanosis or lower extremity edema bilaterally Skin: no lesions on visible extremities Neuro: No focal deficits. Normal DTRs  ASSESSMENT:  #1. Recent acute gastrointestinal illness manifested by nausea with vomiting, abdominal pain, and diarrhea. Associated dehydration. Symptoms most compatible with acute infectious etiology such as a norovirus. Of interest, similar problems over 4 years ago with abnormal imaging in different area of small bowel.? More exotic diagnoses such as C1 esterase inhibitor deficiency  or eosinophilic gastroenteritis (though no eosinophils on  presentation). Clinically doing quite well currently. I feel that we need to pursue these diagnoses or advanced imaging further this point, but would consider such should she develop additional similar episode. She agrees #2. Abnormal imaging as described #3. Colon cancer screening. Baseline risk. Appropriate candidate without contraindication   PLAN:  #1. Patient has agreed contact this office should she develop recurrent GI symptoms. #2. Schedule screening colonoscopy.The nature of the procedure, as well as the risks, benefits, and alternatives were carefully and thoroughly reviewed with the patient. Ample time for discussion and questions allowed. The patient understood, was satisfied, and agreed to proceed. #3. Ongoing general medical care Dr. Sarajane Jews.  A copy of this consultation note has been sent to Dr. Sarajane Jews

## 2015-08-04 NOTE — Patient Instructions (Signed)

## 2015-08-24 ENCOUNTER — Ambulatory Visit
Admission: RE | Admit: 2015-08-24 | Discharge: 2015-08-24 | Disposition: A | Discharge status: Home / Self Care | Payer: BC Managed Care – PPO | Source: Ambulatory Visit

## 2015-08-24 DIAGNOSIS — Z1231 Encounter for screening mammogram for malignant neoplasm of breast: Secondary | ICD-10-CM

## 2015-09-21 ENCOUNTER — Encounter: Payer: Self-pay | Admitting: Internal Medicine

## 2015-10-01 ENCOUNTER — Encounter: Payer: Self-pay | Admitting: Internal Medicine

## 2015-10-01 ENCOUNTER — Ambulatory Visit (AMBULATORY_SURGERY_CENTER): Payer: BC Managed Care – PPO | Admitting: Internal Medicine

## 2015-10-01 VITALS — BP 111/65 | HR 65 | Temp 98.9°F | Resp 10 | Ht 66.75 in | Wt 157.0 lb

## 2015-10-01 DIAGNOSIS — D122 Benign neoplasm of ascending colon: Secondary | ICD-10-CM | POA: Diagnosis not present

## 2015-10-01 DIAGNOSIS — D120 Benign neoplasm of cecum: Secondary | ICD-10-CM

## 2015-10-01 DIAGNOSIS — Z1211 Encounter for screening for malignant neoplasm of colon: Secondary | ICD-10-CM

## 2015-10-01 HISTORY — PX: COLONOSCOPY: SHX174

## 2015-10-01 MED ORDER — SODIUM CHLORIDE 0.9 % IV SOLN: 500.0000 mL | INTRAVENOUS | Status: DC

## 2015-10-01 NOTE — Progress Notes (Signed)
Report to PACU, RN, vss, BBS= Clear.  

## 2015-10-01 NOTE — Op Note (Signed)
[facility] Patient Name: [name] Procedure Date: 10/01/2015 8:00 AM MRN: [id number] Endoscopist: Docia Chuck. Henrene Pastor , MD Age: 60 Date of Birth: July 14, 1955 Gender: Female Procedure:                Colonoscopy w/ snare polypectomy x 2 Indications:              Screening for colorectal malignant neoplasm Medicines:                Monitored Anesthesia Care Procedure:                Pre-Anesthesia Assessment:                           - Prior to the procedure, a History and Physical                            was performed, and patient medications and                            allergies were reviewed. The patient's tolerance of                            previous anesthesia was also reviewed. The risks                            and benefits of the procedure and the sedation                            options and risks were discussed with the patient.                            All questions were answered, and informed consent                            was obtained. Prior Anticoagulants: The patient has                            taken no previous anticoagulant or antiplatelet                            agents. ASA Grade Assessment: II - A patient with                            mild systemic disease. After reviewing the risks                            and benefits, the patient was deemed in                            satisfactory condition to undergo the procedure.                           After obtaining informed consent, the colonoscope  was passed under direct vision. Throughout the                            procedure, the patient's blood pressure, pulse, and                            oxygen saturations were monitored continuously. The                            Model CF-HQ190L [id number]) scope was introduced                            through the anus and advanced to the the cecum,                            identified by appendiceal  orifice and ileocecal                            valve. The ileocecal valve, appendiceal orifice,                            and rectum were photographed. The quality of the                            bowel preparation was excellent. The colonoscopy                            was performed without difficulty. The patient                            tolerated the procedure well. The bowel preparation                            used was SUPREP. Scope In: 8:20:24 AM Scope Out: 8:37:18 AM Scope Withdrawal Time: 0 hours 13 minutes 19 seconds  Total Procedure Duration: 0 hours 16 minutes 54 seconds  Findings:                 Two polyps were found in the ascending colon and                            cecum. The polyps were 1 to 4 mm in size. These                            polyps were removed with a cold snare. Resection                            and retrieval were complete.                           Internal hemorrhoids were found during retroflexion.                           The exam was otherwise without abnormality on  direct and retroflexion views. Complications:            No immediate complications. Estimated blood loss:                            None. Estimated Blood Loss:     Estimated blood loss: none. Impression:               - Two 1 to 4 mm polyps in the ascending colon and                            in the cecum, removed with a cold snare. Resected                            and retrieved.                           - Internal hemorrhoids.                           - The examination was otherwise normal on direct                            and retroflexion views. Recommendation:           - Repeat colonoscopy in 5-10 years for surveillance.                           - Await pathology results. Docia Chuck. Henrene Pastor, MD 10/01/2015 8:45:44 AM This report has been signed electronically. CC Letter to:             Annie Main A. Sarajane Jews, MD

## 2015-10-01 NOTE — Patient Instructions (Signed)
YOU HAD AN ENDOSCOPIC PROCEDURE TODAY AT THE [facility]:   Refer to the procedure report that was given to you for any specific questions about what was found during the examination.  If the procedure report does not answer your questions, please call your gastroenterologist to clarify.  If you requested that your care partner not be given the details of your procedure findings, then the procedure report has been included in a sealed envelope for you to review at your convenience later.  YOU SHOULD EXPECT: Some feelings of bloating in the abdomen. Passage of more gas than usual.  Walking can help get rid of the air that was put into your GI tract during the procedure and reduce the bloating. If you had a lower endoscopy (such as a colonoscopy or flexible sigmoidoscopy) you may notice spotting of blood in your stool or on the toilet paper. If you underwent a bowel prep for your procedure, you may not have a normal bowel movement for a few days.  Please Note:  You might notice some irritation and congestion in your nose or some drainage.  This is from the oxygen used during your procedure.  There is no need for concern and it should clear up in a day or so.  SYMPTOMS TO REPORT IMMEDIATELY:   Following lower endoscopy (colonoscopy or flexible sigmoidoscopy):  Excessive amounts of blood in the stool  Significant tenderness or worsening of abdominal pains  Swelling of the abdomen that is new, acute  Fever of 100F or higher   For urgent or emergent issues, a gastroenterologist can be reached at any hour by calling [phone].   DIET: Your first meal following the procedure should be a small meal and then it is ok to progress to your normal diet. Heavy or fried foods are harder to digest and may make you feel nauseous or bloated.  Likewise, meals heavy in dairy and vegetables can increase bloating.  Drink plenty of fluids but you should avoid alcoholic beverages for 24 hours.  Try to  increase the fiber in your diet.  ACTIVITY:  You should plan to take it easy for the rest of today and you should NOT DRIVE or use heavy machinery until tomorrow (because of the sedation medicines used during the test).    FOLLOW UP: Our staff will call the number listed on your records the next business day following your procedure to check on you and address any questions or concerns that you may have regarding the information given to you following your procedure. If we do not reach you, we will leave a message.  However, if you are feeling well and you are not experiencing any problems, there is no need to return our call.  We will assume that you have returned to your regular daily activities without incident.  If any biopsies were taken you will be contacted by phone or by letter within the next 1-3 weeks.  Please call us at [phone] if you have not heard about the biopsies in 3 weeks.    SIGNATURES/CONFIDENTIALITY: You and/or your care partner have signed paperwork which will be entered into your electronic medical record.  These signatures attest to the fact that that the information above on your After Visit Summary has been reviewed and is understood.  Full responsibility of the confidentiality of this discharge information lies with you and/or your care-partner.  Read all of the handouts given to you by your recovery room nurse.  Thank-you   for choosing us for your healthcare needs today. 

## 2015-10-01 NOTE — Progress Notes (Signed)
Called to room to assist during endoscopic procedure.  Patient ID and intended procedure confirmed with present staff. Received instructions for my participation in the procedure from the performing physician.  

## 2015-10-02 ENCOUNTER — Telehealth: Payer: Self-pay

## 2015-10-02 NOTE — Telephone Encounter (Signed)
  Follow up Call-  Call back number 10/01/2015 10/01/2015  Post procedure Call Back phone  # [phone] cell [phone] cell  Permission to leave phone message Yes Yes     Patient questions:  Do you have a fever, pain , or abdominal swelling? No. Pain Score  0 *  Have you tolerated food without any problems? Yes.    Have you been able to return to your normal activities? Yes.    Do you have any questions about your discharge instructions: Diet   No. Medications  No. Follow up visit  No.  Do you have questions or concerns about your Care? No.  Actions: * If pain score is 4 or above: No action needed, pain <4.

## 2015-10-05 ENCOUNTER — Encounter: Payer: Self-pay | Admitting: Internal Medicine

## 2015-10-16 ENCOUNTER — Other Ambulatory Visit: Payer: Self-pay | Admitting: Family Medicine

## 2015-10-20 ENCOUNTER — Telehealth: Payer: Self-pay | Admitting: General Practice

## 2015-10-20 ENCOUNTER — Other Ambulatory Visit: Payer: Self-pay | Admitting: General Practice

## 2015-10-20 MED ORDER — ZOLPIDEM TARTRATE 10 MG PO TABS: 10.0000 mg | ORAL_TABLET | Freq: Every day | ORAL | Status: DC

## 2015-10-20 NOTE — Telephone Encounter (Signed)
Call in #[phone] with 5 rf 

## 2015-12-18 ENCOUNTER — Ambulatory Visit (INDEPENDENT_AMBULATORY_CARE_PROVIDER_SITE_OTHER): Payer: BC Managed Care – PPO | Admitting: Family Medicine

## 2015-12-18 VITALS — BP 114/68 | HR 77 | Temp 98.8°F | Ht 66.75 in | Wt 159.0 lb

## 2015-12-18 DIAGNOSIS — T63301A Toxic effect of unspecified spider venom, accidental (unintentional), initial encounter: Secondary | ICD-10-CM

## 2015-12-18 MED ORDER — DOXYCYCLINE HYCLATE 100 MG PO CAPS: 100.0000 mg | ORAL_CAPSULE | Freq: Two times a day (BID) | ORAL | 0 refills | Status: DC

## 2015-12-18 NOTE — Progress Notes (Signed)
Pre visit review using our clinic review tool, if applicable. No additional management support is needed unless otherwise documented below in the visit note. 

## 2015-12-20 ENCOUNTER — Observation Stay (HOSPITAL_COMMUNITY)
Admission: EM | Admit: 2015-12-20 | Discharge: 2015-12-22 | Disposition: A | Discharge status: Home / Self Care | Payer: BC Managed Care – PPO | Attending: Cardiovascular Disease | Admitting: Cardiovascular Disease

## 2015-12-20 ENCOUNTER — Encounter (HOSPITAL_COMMUNITY): Payer: Self-pay

## 2015-12-20 ENCOUNTER — Emergency Department (HOSPITAL_COMMUNITY): Payer: BC Managed Care – PPO

## 2015-12-20 DIAGNOSIS — I480 Paroxysmal atrial fibrillation: Secondary | ICD-10-CM | POA: Diagnosis present

## 2015-12-20 DIAGNOSIS — I4891 Unspecified atrial fibrillation: Principal | ICD-10-CM | POA: Insufficient documentation

## 2015-12-20 DIAGNOSIS — R079 Chest pain, unspecified: Secondary | ICD-10-CM | POA: Diagnosis not present

## 2015-12-20 DIAGNOSIS — I10 Essential (primary) hypertension: Secondary | ICD-10-CM | POA: Diagnosis not present

## 2015-12-20 DIAGNOSIS — E876 Hypokalemia: Secondary | ICD-10-CM | POA: Diagnosis not present

## 2015-12-20 DIAGNOSIS — R0789 Other chest pain: Secondary | ICD-10-CM | POA: Diagnosis present

## 2015-12-20 DIAGNOSIS — L0390 Cellulitis, unspecified: Secondary | ICD-10-CM | POA: Diagnosis present

## 2015-12-20 LAB — BASIC METABOLIC PANEL
ANION GAP: 10 (ref 5–15)
BUN: 17 mg/dL (ref 6–20)
CO2: 22 mmol/L (ref 22–32)
Calcium: 9 mg/dL (ref 8.9–10.3)
Chloride: 101 mmol/L (ref 101–111)
Creatinine, Ser: 1.11 mg/dL — ABNORMAL HIGH (ref 0.44–1.00)
GFR calc Af Amer: >60 mL/min (ref >60)
GFR calc non Af Amer: 53 mL/min — ABNORMAL LOW (ref >60)
GLUCOSE: 157 mg/dL — AB (ref 65–99)
POTASSIUM: 2.7 mmol/L — AB (ref 3.5–5.1)
Sodium: 133 mmol/L — ABNORMAL LOW (ref 135–145)

## 2015-12-20 LAB — I-STAT TROPONIN, ED: Troponin i, poc: 0 ng/mL (ref 0.00–0.08)

## 2015-12-20 LAB — CBC
HEMATOCRIT: 35.1 % — AB (ref 36.0–46.0)
HEMOGLOBIN: 11.9 g/dL — AB (ref 12.0–15.0)
MCH: 29.6 pg (ref 26.0–34.0)
MCHC: 33.9 g/dL (ref 30.0–36.0)
MCV: 87.3 fL (ref 78.0–100.0)
Platelets: 199 10*3/uL (ref 150–400)
RBC: 4.02 MIL/uL (ref 3.87–5.11)
RDW: 12.4 % (ref 11.5–15.5)
WBC: 11.1 10*3/uL — ABNORMAL HIGH (ref 4.0–10.5)

## 2015-12-20 LAB — D-DIMER, QUANTITATIVE: D-Dimer, Quant: 2.57 ug/mL-FEU — ABNORMAL HIGH (ref 0.00–0.50)

## 2015-12-20 MED ORDER — DILTIAZEM HCL 100 MG IV SOLR: 5.0000 mg/h | Freq: Once | INTRAVENOUS | Status: DC

## 2015-12-20 MED ORDER — KETOROLAC TROMETHAMINE 30 MG/ML IJ SOLN
30.0000 mg | Freq: Once | INTRAMUSCULAR | Status: AC
  Administered 2015-12-20: 30 mg via INTRAVENOUS
  Filled 2015-12-20: qty 1

## 2015-12-20 MED ORDER — IOPAMIDOL (ISOVUE-370) INJECTION 76%
INTRAVENOUS | Status: AC
  Administered 2015-12-20: 70 mL
  Filled 2015-12-20: qty 100

## 2015-12-20 MED ORDER — POTASSIUM CHLORIDE CRYS ER 20 MEQ PO TBCR
40.0000 meq | EXTENDED_RELEASE_TABLET | Freq: Once | ORAL | Status: AC
  Administered 2015-12-20: 40 meq via ORAL
  Filled 2015-12-20: qty 2

## 2015-12-20 MED ORDER — HYDROCODONE-ACETAMINOPHEN 5-325 MG PO TABS
1.0000 | ORAL_TABLET | Freq: Once | ORAL | Status: AC
  Administered 2015-12-21: 1 via ORAL
  Filled 2015-12-20: qty 1

## 2015-12-20 MED ORDER — ENOXAPARIN SODIUM 40 MG/0.4ML ~~LOC~~ SOLN
40.0000 mg | Freq: Once | SUBCUTANEOUS | Status: AC
  Administered 2015-12-21: 40 mg via SUBCUTANEOUS
  Filled 2015-12-20: qty 0.4

## 2015-12-20 MED ORDER — SODIUM CHLORIDE 0.9 % IV BOLUS (SEPSIS)
500.0000 mL | Freq: Once | INTRAVENOUS | Status: AC
  Administered 2015-12-20: 500 mL via INTRAVENOUS

## 2015-12-20 MED ORDER — DILTIAZEM HCL 25 MG/5ML IV SOLN
5.0000 mg | Freq: Once | INTRAVENOUS | Status: AC
  Administered 2015-12-20: 5 mg via INTRAVENOUS
  Filled 2015-12-20: qty 5

## 2015-12-20 NOTE — ED Notes (Signed)
Pt in xray

## 2015-12-20 NOTE — ED Provider Notes (Signed)
Emergency Department Provider Note   I have reviewed the triage vital signs and the nursing notes.   HISTORY  Chief Complaint Chest Pain   HPI [name] is a 60 y.o. female with PMH of HTN and intracranial aneurysm s/p repair presents to the emergency department for evaluation of constant chest pain for the last 36 hours. The patient reports being placed on doxycycline for resumed bug bites to the left lower extremity. She has taken 2 doses of this medication and shortly after the second dose she began having substernal, constant, chest discomfort radiating to her shoulders. She denies associated dyspnea but does note it is worse with deep breathing. No known history of a-fib. No associated fevers, shaking chills, vomiting, diarrhea. She notes hypokalemia in the past requiring supplementation but is not currently taking this because she was told it had resolved. She did take ASA today for pain relief with minimal relief.   Past Medical History:  Diagnosis Date  . Acute renal failure (ARF) ([place])   . Headache(784.0)   . Hypertension   . Insomnia   . Intracranial aneurysm    status post surgery    Patient Active Problem List   Diagnosis Date Noted  . Enteritis 06/04/2015  . ARF (acute renal failure) ([place]) 06/04/2015  . Abdominal pain 06/03/2015  . Fever blister 11/07/2013  . Cellulitis 02/13/2012  . INSOMNIA 11/22/2007  . HEADACHE 11/22/2007  . Essential hypertension 11/21/2006  . ENDOMETRIOSIS NOS 11/21/2006  . CHICKENPOX, HX OF 11/21/2006    Past Surgical History:  Procedure Laterality Date  . ABDOMINAL HYSTERECTOMY    . CEREBRAL ANEURYSM REPAIR  1998 bt dr Sherwood Gambler  . OVARIAN CYST REMOVAL      Current Outpatient Rx  . Order #: [id number] Class: Historical Med  . Order #: [id number] Class: Historical Med  . Order #: [id number] Class: Normal  . Order #: [id number] Class: Normal  . Order #: [id number] Class: Phone In    Allergies Doxycycline  Family History    Problem Relation Age of Onset  . Hypertension Mother   . Cholelithiasis Mother   . Scoliosis Mother   . Arthritis Mother   . Sudden death Father   . Aneurysm Father     brain  . Anuerysm Sister     brain  . Aneurysm Maternal Uncle   . Cholelithiasis Daughter   . Colon cancer Neg Hx   . Esophageal cancer Neg Hx   . Pancreatic cancer Neg Hx   . Rectal cancer Neg Hx   . Stomach cancer Neg Hx     Social History Social History  Substance Use Topics  . Smoking status: Never Smoker  . Smokeless tobacco: Never Used  . Alcohol use 0.0 oz/week     Comment: social    Review of Systems  Constitutional: No fever/chills Eyes: No visual changes. ENT: No sore throat. Cardiovascular: Positive chest pain. Respiratory: Denies shortness of breath. Gastrointestinal: No abdominal pain.  No nausea, no vomiting.  No diarrhea.  No constipation. Genitourinary: Negative for dysuria. Musculoskeletal: Negative for back pain. Skin: Negative for rash. Neurological: Negative for headaches, focal weakness or numbness.  10-point ROS otherwise negative.  ____________________________________________   PHYSICAL EXAM:  VITAL SIGNS: ED Triage Vitals  Enc Vitals Group     BP 12/20/15 1828 103/65     Pulse Rate 12/20/15 1828 (!) 136     Resp 12/20/15 1828 20     Temp 12/20/15 1828 99.6 F (37.6 C)     Temp Source  12/20/15 1828 Oral     SpO2 12/20/15 1828 100 %     Pain Score 12/20/15 1825 9   Constitutional: Alert and oriented. Well appearing and in no acute distress. Eyes: Conjunctivae are normal. PERRL. EOMI. Head: Atraumatic. Nose: No congestion/rhinnorhea. Mouth/Throat: Mucous membranes are moist. Oropharynx non-erythematous. Neck: No stridor.   Cardiovascular: Irregularly irregular rhythm. Good peripheral circulation. Grossly normal heart sounds.   Respiratory: Normal respiratory effort.  No retractions. Lungs CTAB. Gastrointestinal: Soft and nontender. No distention.   Musculoskeletal: No lower extremity tenderness nor edema. No gross deformities of extremities. Neurologic:  Normal speech and language. No gross focal neurologic deficits are appreciated.  Skin:  Skin is warm, dry and intact. Two lesions to the LLE with no fluctuance or purulent drainage.  Psychiatric: Mood and affect are normal. Speech and behavior are normal.  ____________________________________________   LABS (all labs ordered are listed, but only abnormal results are displayed)  Labs Reviewed  BASIC METABOLIC PANEL - Abnormal; Notable for the following:       Result Value   Sodium 133 (*)    Potassium 2.7 (*)    Glucose, Bld 157 (*)    Creatinine, Ser 1.11 (*)    GFR calc non Af Amer 53 (*)    All other components within normal limits  CBC - Abnormal; Notable for the following:    WBC 11.1 (*)    Hemoglobin 11.9 (*)    HCT 35.1 (*)    All other components within normal limits  I-STAT TROPOININ, ED   ____________________________________________  EKG  New a-fib with RVR. No STEMI.  ____________________________________________  RADIOLOGY  Dg Chest 2 View  Result Date: 12/20/2015 CLINICAL DATA:  Chest pain since Saturday morning. EXAM: CHEST  2 VIEW COMPARISON:  None. FINDINGS: The heart size and mediastinal contours are within normal limits. Both lungs are clear. Slight elevation of the right hemidiaphragm. Mild degenerative spurring within the lower thoracic spine and upper lumbar spine. No acute or suspicious osseous finding. IMPRESSION: No active cardiopulmonary disease. Electronically Signed   By: Franki Cabot M.D.   On: 12/20/2015 19:32   ____________________________________________   PROCEDURES  Procedure(s) performed:   Procedures  None ____________________________________________   INITIAL IMPRESSION / ASSESSMENT AND PLAN / ED COURSE  Pertinent labs & imaging results that were available during my care of the patient were reviewed by me and  considered in my medical decision making (see chart for details).  Patient resents to the emergency department for evaluation of chest pain. Initial evaluation and EKG consistent with new onset A. fib with RVR. Patient's heart rate is controlled without specific intervention during my evaluation. Plan for repeat EKG. Patient also with a critically low potassium of 2.7 of unclear etiology.   08:42 PM D-dimer significantly elevated. In the setting of CP and new a-fib with RVR will obtain CTA to evaluate for PE. Updated patient regarding plan and labs. Diltiazem given but will hold on aggressive rate control at this time with unknown underlying etiology.   11:50 PM Updated patient on CT results and paged Cardiology. Rate remains low 100s without medication. Will give hydrocodone for pain and Lovenox.   Spoke with Dr. Wynonia Lawman with Cardiology regarding admission. Appreciate assistance with case.  ____________________________________________  FINAL CLINICAL IMPRESSION(S) / ED DIAGNOSES  Final diagnoses:  Nonspecific chest pain  Atrial fibrillation with RVR (HCC)  Hypokalemia     MEDICATIONS GIVEN DURING THIS VISIT:  Medications  zolpidem (AMBIEN) tablet 5 mg (not administered)  doxycycline (VIBRA-TABS) tablet 100 mg (100 mg Oral Given 12/21/15 0442)  acetaminophen (TYLENOL) tablet 650 mg (not administered)  ondansetron (ZOFRAN) injection 4 mg (4 mg Intravenous Given 12/21/15 0959)  0.9 %  sodium chloride infusion ( Intravenous New Bag/Given 12/21/15 0343)  potassium chloride SA (K-DUR,KLOR-CON) CR tablet 40 mEq (40 mEq Oral Given 12/21/15 0951)  pantoprazole (PROTONIX) EC tablet 40 mg (40 mg Oral Given 12/21/15 0804)  apixaban (ELIQUIS) tablet 5 mg (5 mg Oral Given 12/21/15 0951)  amiodarone (PACERONE) tablet 400 mg (400 mg Oral Given 12/21/15 0951)  ketorolac (TORADOL) 30 MG/ML injection 30 mg (30 mg Intravenous Given 12/20/15 2027)  potassium chloride SA (K-DUR,KLOR-CON) CR tablet 40 mEq (40  mEq Oral Given 12/20/15 2027)  diltiazem (CARDIZEM) injection 5 mg (5 mg Intravenous Given 12/20/15 2033)  sodium chloride 0.9 % bolus 500 mL (0 mLs Intravenous Stopped 12/20/15 2130)  iopamidol (ISOVUE-370) 76 % injection (70 mLs  Contrast Given 12/20/15 2312)  HYDROcodone-acetaminophen (NORCO/VICODIN) 5-325 MG per tablet 1 tablet (1 tablet Oral Given 12/21/15 0047)  enoxaparin (LOVENOX) injection 40 mg (40 mg Subcutaneous Given 12/21/15 0048)  sodium chloride 0.9 % bolus 500 mL (0 mLs Intravenous Duplicate [id number])  sodium chloride 0.9 % bolus 500 mL (0 mLs Intravenous Stopped 12/21/15 0232)  heparin bolus via infusion 3,000 Units (3,000 Units Intravenous Bolus from Bag 12/21/15 0342)  ketorolac (TORADOL) 30 MG/ML injection 30 mg (30 mg Intravenous Given 12/21/15 0709)     NEW OUTPATIENT MEDICATIONS STARTED DURING THIS VISIT:  None   Note:  This document was prepared using Dragon voice recognition software and may include unintentional dictation errors.  Nanda Quinton, MD Emergency Medicine   Margette Fast, MD 12/21/15 [phone]

## 2015-12-20 NOTE — ED Triage Notes (Signed)
Pt complaining of an aching chest pain that radiates to back and neck. Pt states ongoing since yesterday morning. Denies any SOB, lightheaded or dizziness. Pt states dry cough.

## 2015-12-20 NOTE — ED Notes (Signed)
Taken to CT at this time. 

## 2015-12-20 NOTE — ED Notes (Signed)
Pt HR = 137 in triage.

## 2015-12-21 ENCOUNTER — Encounter: Payer: Self-pay | Admitting: Family Medicine

## 2015-12-21 ENCOUNTER — Encounter (HOSPITAL_COMMUNITY): Payer: Self-pay | Admitting: Cardiology

## 2015-12-21 DIAGNOSIS — R079 Chest pain, unspecified: Secondary | ICD-10-CM | POA: Diagnosis not present

## 2015-12-21 DIAGNOSIS — E876 Hypokalemia: Secondary | ICD-10-CM | POA: Diagnosis not present

## 2015-12-21 DIAGNOSIS — I4891 Unspecified atrial fibrillation: Secondary | ICD-10-CM | POA: Diagnosis not present

## 2015-12-21 DIAGNOSIS — I10 Essential (primary) hypertension: Secondary | ICD-10-CM | POA: Diagnosis not present

## 2015-12-21 DIAGNOSIS — I480 Paroxysmal atrial fibrillation: Secondary | ICD-10-CM | POA: Diagnosis present

## 2015-12-21 LAB — TSH: TSH: 0.487 u[IU]/mL (ref 0.350–4.500)

## 2015-12-21 LAB — COMPREHENSIVE METABOLIC PANEL
ALK PHOS: 73 U/L (ref 38–126)
ALT: 17 U/L (ref 14–54)
AST: 17 U/L (ref 15–41)
Albumin: 3 g/dL — ABNORMAL LOW (ref 3.5–5.0)
Anion gap: 7 (ref 5–15)
BUN: 14 mg/dL (ref 6–20)
CALCIUM: 8 mg/dL — AB (ref 8.9–10.3)
CHLORIDE: 108 mmol/L (ref 101–111)
CO2: 20 mmol/L — AB (ref 22–32)
CREATININE: 0.89 mg/dL (ref 0.44–1.00)
Glucose, Bld: 140 mg/dL — ABNORMAL HIGH (ref 65–99)
Potassium: 2.9 mmol/L — ABNORMAL LOW (ref 3.5–5.1)
SODIUM: 135 mmol/L (ref 135–145)
Total Bilirubin: 1.7 mg/dL — ABNORMAL HIGH (ref 0.3–1.2)
Total Protein: 6.9 g/dL (ref 6.5–8.1)

## 2015-12-21 LAB — TROPONIN I
TROPONIN I: 0.04 ng/mL — AB (ref <0.03)
TROPONIN I: 0.05 ng/mL — AB (ref <0.03)
Troponin I: <0.03 ng/mL (ref <0.03)

## 2015-12-21 LAB — BASIC METABOLIC PANEL
Anion gap: 3 — ABNORMAL LOW (ref 5–15)
BUN: 14 mg/dL (ref 6–20)
CHLORIDE: 111 mmol/L (ref 101–111)
CO2: 20 mmol/L — AB (ref 22–32)
CREATININE: 0.93 mg/dL (ref 0.44–1.00)
Calcium: 8.2 mg/dL — ABNORMAL LOW (ref 8.9–10.3)
GFR calc Af Amer: >60 mL/min (ref >60)
GFR calc non Af Amer: >60 mL/min (ref >60)
GLUCOSE: 128 mg/dL — AB (ref 65–99)
POTASSIUM: 4.4 mmol/L (ref 3.5–5.1)
SODIUM: 134 mmol/L — AB (ref 135–145)

## 2015-12-21 LAB — SEDIMENTATION RATE: Sed Rate: 80 mm/hr — ABNORMAL HIGH (ref 0–22)

## 2015-12-21 LAB — POTASSIUM: POTASSIUM: 3.7 mmol/L (ref 3.5–5.1)

## 2015-12-21 LAB — MAGNESIUM: Magnesium: 1.8 mg/dL (ref 1.7–2.4)

## 2015-12-21 MED ORDER — ASPIRIN EC 325 MG PO TBEC
325.0000 mg | DELAYED_RELEASE_TABLET | Freq: Every day | ORAL | Status: DC | PRN
  Administered 2015-12-21: 325 mg via ORAL
  Filled 2015-12-21: qty 1

## 2015-12-21 MED ORDER — HEPARIN (PORCINE) IN NACL 100-0.45 UNIT/ML-% IJ SOLN
1000.0000 [IU]/h | INTRAMUSCULAR | Status: DC
  Administered 2015-12-21: 1000 [IU]/h via INTRAVENOUS
  Filled 2015-12-21 (×2): qty 250

## 2015-12-21 MED ORDER — PANTOPRAZOLE SODIUM 40 MG PO TBEC
40.0000 mg | DELAYED_RELEASE_TABLET | Freq: Every day | ORAL | Status: DC
  Administered 2015-12-21 – 2015-12-22 (×2): 40 mg via ORAL
  Filled 2015-12-21 (×2): qty 1

## 2015-12-21 MED ORDER — ONDANSETRON HCL 4 MG/2ML IJ SOLN
4.0000 mg | Freq: Four times a day (QID) | INTRAMUSCULAR | Status: DC | PRN
  Administered 2015-12-21 – 2015-12-22 (×4): 4 mg via INTRAVENOUS
  Filled 2015-12-21 (×4): qty 2

## 2015-12-21 MED ORDER — KETOROLAC TROMETHAMINE 30 MG/ML IJ SOLN
30.0000 mg | Freq: Once | INTRAMUSCULAR | Status: AC | PRN
  Administered 2015-12-21: 30 mg via INTRAVENOUS
  Filled 2015-12-21: qty 1

## 2015-12-21 MED ORDER — SODIUM CHLORIDE 0.9 % IV SOLN
INTRAVENOUS | Status: DC
  Administered 2015-12-21 (×2): via INTRAVENOUS

## 2015-12-21 MED ORDER — POTASSIUM CHLORIDE CRYS ER 20 MEQ PO TBCR
40.0000 meq | EXTENDED_RELEASE_TABLET | Freq: Once | ORAL | Status: AC
  Administered 2015-12-21: 40 meq via ORAL
  Filled 2015-12-21: qty 2

## 2015-12-21 MED ORDER — APIXABAN 5 MG PO TABS
5.0000 mg | ORAL_TABLET | Freq: Two times a day (BID) | ORAL | Status: DC
  Administered 2015-12-21 – 2015-12-22 (×3): 5 mg via ORAL
  Filled 2015-12-21 (×3): qty 1

## 2015-12-21 MED ORDER — SODIUM CHLORIDE 0.9 % IV BOLUS (SEPSIS): 500.0000 mL | Freq: Once | INTRAVENOUS | Status: AC

## 2015-12-21 MED ORDER — DILTIAZEM HCL-DEXTROSE 100-5 MG/100ML-% IV SOLN (PREMIX)
5.0000 mg/h | INTRAVENOUS | Status: DC
  Filled 2015-12-21: qty 100

## 2015-12-21 MED ORDER — DOXYCYCLINE HYCLATE 100 MG PO TABS
100.0000 mg | ORAL_TABLET | Freq: Two times a day (BID) | ORAL | Status: DC
  Administered 2015-12-21 – 2015-12-22 (×4): 100 mg via ORAL
  Filled 2015-12-21 (×4): qty 1

## 2015-12-21 MED ORDER — POTASSIUM CHLORIDE CRYS ER 20 MEQ PO TBCR
40.0000 meq | EXTENDED_RELEASE_TABLET | ORAL | Status: AC
  Administered 2015-12-21 (×3): 40 meq via ORAL
  Filled 2015-12-21 (×3): qty 2

## 2015-12-21 MED ORDER — AMIODARONE HCL 200 MG PO TABS
400.0000 mg | ORAL_TABLET | Freq: Two times a day (BID) | ORAL | Status: DC
  Administered 2015-12-21 – 2015-12-22 (×3): 400 mg via ORAL
  Filled 2015-12-21 (×3): qty 2

## 2015-12-21 MED ORDER — HEPARIN BOLUS VIA INFUSION
3000.0000 [IU] | Freq: Once | INTRAVENOUS | Status: AC
  Administered 2015-12-21: 3000 [IU] via INTRAVENOUS
  Filled 2015-12-21: qty 3000

## 2015-12-21 MED ORDER — MAGNESIUM SULFATE 2 GM/50ML IV SOLN
2.0000 g | Freq: Once | INTRAVENOUS | Status: AC
  Administered 2015-12-21: 2 g via INTRAVENOUS
  Filled 2015-12-21: qty 50

## 2015-12-21 MED ORDER — ACETAMINOPHEN 325 MG PO TABS: 650.0000 mg | ORAL_TABLET | ORAL | Status: DC | PRN

## 2015-12-21 MED ORDER — ZOLPIDEM TARTRATE 5 MG PO TABS
5.0000 mg | ORAL_TABLET | Freq: Every day | ORAL | Status: DC
  Administered 2015-12-22: 5 mg via ORAL
  Filled 2015-12-21: qty 1

## 2015-12-21 MED ORDER — SODIUM CHLORIDE 0.9 % IV BOLUS (SEPSIS)
500.0000 mL | Freq: Once | INTRAVENOUS | Status: AC
  Administered 2015-12-21: 500 mL via INTRAVENOUS

## 2015-12-21 NOTE — Progress Notes (Signed)
ANTICOAGULATION CONSULT NOTE - Initial Consult  Pharmacy Consult for heparin Indication: atrial fibrillation  Allergies  Allergen Reactions  . Doxycycline Nausea Only    Patient Measurements: Height: 5' 6.75" (169.5 cm) Weight: 158 lb 15.2 oz (72.1 kg) IBW/kg (Calculated) : 61.03  Vital Signs: Temp: 99.6 F (37.6 C) (07/30 1828) Temp Source: Oral (07/30 1828) BP: 88/61 (07/31 0115) Pulse Rate: 98 (07/31 0115)  Labs:  Recent Labs  12/20/15 1823  HGB 11.9*  HCT 35.1*  PLT 199  CREATININE 1.11*    Estimated Creatinine Clearance: 52.6 mL/min (by C-G formula based on SCr of 1.11 mg/dL).   Medical History: Past Medical History:  Diagnosis Date  . Acute renal failure (ARF) ([place])   . Headache(784.0)   . Hypertension   . Insomnia   . Intracranial aneurysm    status post surgery    Assessment: 60yo female c/o CP radiating to back and neck since yesterday am, found to be in Afib, CHA2DS2VASC score 2, to begin heparin.  Goal of Therapy:  Heparin level 0.3-0.7 units/ml Monitor platelets by anticoagulation protocol: Yes   Plan:  Will give heparin 3000 units IV bolus x1 followed by gtt at 1000 units/hr and monitor heparin levels and CBC.  Wynona Neat, PharmD, BCPS  12/21/2015,2:28 AM

## 2015-12-21 NOTE — Progress Notes (Addendum)
Recheck of K is 3.7. Mag 1.8 and troponin is now 0.05.  Dorene Ar, NP paged. See new orders. Will continue to monitor closely.

## 2015-12-21 NOTE — Care Management Note (Addendum)
Case Management Note  Patient Details  Name: SHANEIA GAYMON MRN: [id number] Date of Birth: [birth year]  Subjective/Objective:  Pt presented for Atrial Fib. Plan for d/c home on Eliquis                  Action/Plan: Benefits check is completed. CM will provide pt with 30 day free and co pay card. Pt uses [facility] on [address].  No further needs from CM at this time.  S/W JOSHA @ [facility] # [phone]   ELIQUIS 5 MG BID ( 30 )   COVER- YES  CO-PAY- $ 30.00  TIER- 2 DRUG  PRIOR APPROVAL - NO  [facility] : [facility]    Expected Discharge Date:  12/21/15               Expected Discharge Plan:  Home/Self Care  In-House Referral:  NA  Discharge planning Services  CM Consult, Medication Assistance  Post Acute Care Choice:  NA Choice offered to:  NA  DME Arranged:  N/A DME Agency:  NA  HH Arranged:  NA HH Agency:  NA  Status of Service:  Completed, signed off  If discussed at [place] of Stay Meetings, dates discussed:    Additional Comments:  Bethena Roys, RN 12/21/2015, 1:19 PM

## 2015-12-21 NOTE — Progress Notes (Signed)
Subjective:  Denies dyspnea; complains of chest pain with some improvement with sitting up   Objective:  Vitals:   12/21/15 0231 12/21/15 0253 12/21/15 0738 12/21/15 0748  BP:  98/74 (!) 89/61 (!) 92/52  Pulse:  (!) 130  (!) 11  Resp:      Temp: 97.8 F (36.6 C) 98.9 F (37.2 C)  99.5 F (37.5 C)  TempSrc: Oral Oral  Oral  SpO2:  97%  94%  Weight:  158 lb 11.2 oz (72 kg)    Height:  5' 8" (1.727 m)      Intake/Output from previous day: No intake or output data in the 24 hours ending 12/21/15 0840  Physical Exam: Physical exam: Well-developed well-nourished in no acute distress.  Skin is warm and dry.  HEENT is normal.  Neck is supple.  Chest is clear to auscultation with normal expansion.  Cardiovascular exam is irregular Abdominal exam nontender or distended. No masses palpated. Extremities show no edema. Previous insect bites with erythema noted LLE neuro grossly intact    Lab Results: Basic Metabolic Panel:  Recent Labs  12/20/15 1823 12/21/15 0437  NA 133* 135  K 2.7* 2.9*  CL 101 108  CO2 22 20*  GLUCOSE 157* 140*  BUN 17 14  CREATININE 1.11* 0.89  CALCIUM 9.0 8.0*   CBC:  Recent Labs  12/20/15 1823  WBC 11.1*  HGB 11.9*  HCT 35.1*  MCV 87.3  PLT 199     Assessment/Plan:  1 PAF-TSH normal; await echo. BP borderline and therefore cannot treat with calcium blocker or beta blocker. Will add amiodarone 400 mg BID and follow. Patient appears to have pericarditis which may have caused atrial fibrillation; hopefully arrhythmia will not recur once this improves. Would plan to DC amiodarone in 8 weeks. DC heparin and treat with apixaban (CHADSvasc 2). 2 Possible pericarditis-await echo; ESR 80; treat with NSAID for now. 3 Hypokalemia-supplement and FU with primary care. 4 insect bite-continue doxycycline. 5 Hyperglycemia-check Hgb A1C.   [name] 12/21/2015, 8:40 AM

## 2015-12-21 NOTE — Progress Notes (Signed)
Pt back in Normal Sinus Rhythm. EKG done to confirm and is uploaded into EPIC. HR 83, BP 92/61.  Dorene Ar, NP made aware. Will continue to monitor closely.

## 2015-12-21 NOTE — Discharge Instructions (Addendum)
Information on my medicine - ELIQUIS (apixaban)  This medication education was reviewed with me or my healthcare representative as part of my discharge preparation.  The pharmacist that spoke with me during my hospital stay was:  Georga Bora, PharmD  Why was Eliquis prescribed for you? Eliquis was prescribed for you to reduce the risk of a blood clot forming that can cause a stroke if you have a medical condition called atrial fibrillation (a type of irregular heartbeat).  What do You need to know about Eliquis ? Take your Eliquis TWICE DAILY - one tablet in the morning and one tablet in the evening with or without food. If you have difficulty swallowing the tablet whole please discuss with your pharmacist how to take the medication safely.  Take Eliquis exactly as prescribed by your doctor and DO NOT stop taking Eliquis without talking to the doctor who prescribed the medication.  Stopping may increase your risk of developing a stroke.  Refill your prescription before you run out.  After discharge, you should have regular check-up appointments with your healthcare provider that is prescribing your Eliquis.  In the future your dose may need to be changed if your kidney function or weight changes by a significant amount or as you get older.  What do you do if you miss a dose? If you miss a dose, take it as soon as you remember on the same day and resume taking twice daily.  Do not take more than one dose of ELIQUIS at the same time to make up a missed dose.  Important Safety Information A possible side effect of Eliquis is bleeding. You should call your healthcare provider right away if you experience any of the following: ? Bleeding from an injury or your nose that does not stop. ? Unusual colored urine (red or dark brown) or unusual colored stools (red or black). ? Unusual bruising for unknown reasons. ? A serious fall or if you hit your head (even if there is no bleeding).  Some  medicines may interact with Eliquis and might increase your risk of bleeding or clotting while on Eliquis. To help avoid this, consult your healthcare provider or pharmacist prior to using any new prescription or non-prescription medications, including herbals, vitamins, non-steroidal anti-inflammatory drugs (NSAIDs) and supplements.  This website has more information on Eliquis (apixaban): http://www.eliquis.com/eliquis/home  Heart Healthy Diet  Do not miss the Eliquis   We stopped the asprin as you are now on Eliquis.  The amiodarone you will take 400 mg twice a day for 2 weeks last day 01/05/16 and beginning 01/06/16 you will begin amiodarone 200 mg. ONCE   Daily.    You may take Ibuprofen 800 mg every 8 hours as needed for chest pain.    Call the office if any bleeding teeth, urine or stool.    Call if any questions or problems.

## 2015-12-21 NOTE — Progress Notes (Addendum)
Diltizem gtt not started on PM shift due to Low BP. BP now 89/61 HR 106(laying down)-140(sitting up). Pt put on bedside monitor for closer monitoring. NP text paged and made aware of low BP. Will continue to monitor closely.

## 2015-12-21 NOTE — H&P (Signed)
History and Physical   Admit date: 12/20/2015 Name:  [name] Medical record number: [id number] DOB/Age:  [birth year]  60 y.o. female  Referring Physician:   [facility]  Primary Cardiologist:  Althia Forts  Primary Physician:   Dr. Sarajane Jews  Chief complaint/reason for admission: Chest pain and atrial fibrillation  HPI:  This 60 year old female is previously been in good health.  She had an intracranial aneurysm clipped in number of years ago.  She has a history of hypertension and is had some previous enteritis and has been hypokalemic in the past thought due to GI causes.  About a week ago she was working outside and had 2 spider bites on her leg.  They became somewhat red and inflamed with a central eschar and she saw her primary doctor on Friday and was placed on doxycycline.  Yesterday she had the onset of some mild substernal chest discomfort shortness of breath and weakness and the discomfort was described as somewhat sharp radiating through to her back and worse when she laid down.  She came to the emergency room earlier today and had a CT angiogram that was unremarkable.  She was found to be severely hypokalemic and was in rapid atrial fibrillation.  She was given diltiazem but has developed some hypotension.  Chest discomfort improved from that on admission with slowing of her heart rate and initial troponin was negative.  She has continued to have chest discomfort and the discomfort was better when I had her sit forward tonight.  She has not really been in any acute distress.  She has not had any significant fever.  She normally is active and denies exertional chest pain.  She has no PND, orthopnea or edema.   Past Medical History:  Diagnosis Date  . Acute renal failure (ARF) ([place])   . Headache(784.0)   . Hypertension   . Insomnia   . Intracranial aneurysm    status post surgery       Past Surgical History:  Procedure Laterality Date  . ABDOMINAL HYSTERECTOMY    .  CEREBRAL ANEURYSM REPAIR  1998 bt dr Sherwood Gambler  . OVARIAN CYST REMOVAL    .  Allergies: is allergic to doxycycline.   Medications: Prior to Admission medications   Medication Sig Start Date End Date Taking? Authorizing Provider  acetaminophen (TYLENOL) 500 MG tablet Take 1,000 mg by mouth daily as needed.   Yes Historical Provider, MD  aspirin 325 MG EC tablet Take 325-650 mg by mouth daily as needed for pain.   Yes Historical Provider, MD  doxycycline (VIBRAMYCIN) 100 MG capsule Take 1 capsule (100 mg total) by mouth 2 (two) times daily. 12/18/15 12/28/15 Yes Laurey Morale, MD  NIFEdipine (ADALAT CC) 90 MG 24 hr tablet TAKE 1 TABLET BY MOUTH EVERY DAY 02/25/15  Yes Laurey Morale, MD  zolpidem (AMBIEN) 10 MG tablet Take 1 tablet (10 mg total) by mouth at bedtime. Patient taking differently: Take 5 mg by mouth at bedtime.  10/20/15  Yes Laurey Morale, MD    Family History:  Family Status  Relation Status  . Mother Alive  . Father Deceased  . Sister   . Maternal Uncle   . Daughter   . Neg Hx    Social History:   reports that she has never smoked. She has never used smokeless tobacco. She reports that she drinks alcohol. She reports that she does not use drugs.   Works in [employer], currently lives  with mother and daughter   Review of Systems:  Spider bite on the leg, some mild GI symptoms in terms of diarrhea and occasional dyspepsia.  She has had some aching in her joints since she had the spider bite.  She had a history of having had some acute renal failure previously. Other than as noted above, the remainder of the review of systems is normal  Physical Exam: BP (!) 86/65   Pulse (!) 149   Temp 99.6 F (37.6 C) (Oral)   Resp 23   SpO2 98%   General appearance: Pleasant white female currently in no acute distress Head: Normocephalic, without obvious abnormality, atraumatic Eyes: conjunctivae/corneas clear. PERRL, EOM's intact. Fundi benign. Neck: no  adenopathy, no carotid bruit, no JVD and supple, symmetrical, trachea midline Lungs: clear to auscultation bilaterally Heart: Rapid irregular rhythm, no murmur noted, no pericardial rub noted, chest pain relieved with sitting up and leaning forward. Abdomen: soft, non-tender; bowel sounds normal; no masses,  no organomegaly Pelvic: deferred Extremities: extremities normal, atraumatic, no cyanosis or edema Pulses: 2+ and symmetric Skin: 4 cm erythematous lesion with a central necrotic component on the upper aspect in the lower aspect of the left lower leg Neurologic: Grossly normal  Labs: CBC  Recent Labs  12/20/15 1823  WBC 11.1*  RBC 4.02  HGB 11.9*  HCT 35.1*  PLT 199  MCV 87.3  MCH 29.6  MCHC 33.9  RDW 12.4   CMP   Recent Labs  12/20/15 1823  NA 133*  K 2.7*  CL 101  CO2 22  GLUCOSE 157*  BUN 17  CREATININE 1.11*  CALCIUM 9.0  GFRNONAA 53*  GFRAA >60   EKG: EKG shows atrial fibrillation with a somewhat rapid ventricular response  Radiology: Clear lungs, normal size heart   IMPRESSIONS: 1.  Rapid atrial fibrillation without previous diagnosis CHA2DS2VASC score 2 2.  Borderline hypotension-hold nifedipine 3.  Chest pain with features suggestive of pericarditis with relief sitting forward 4.  Recent spider bite 5.  Hypertension 6.  Severe hypokalemia in the absence of diarrhea or medications-one would worry about primary aldosteronism as a cause for her hypertension  PLAN: Replete potassium, intravenous diltiazem if blood pressure will allow, IV fluids, check serial troponins and check EKG and echocardiogram in the morning.  Hold nifedipine with blood pressure being low.  Signed: Kerry Hough MD [facility]  12/21/2015, 1:13 AM

## 2015-12-21 NOTE — Progress Notes (Signed)
CRITICAL VALUE ALERT  Critical value received:  Troponin 0.04  Date of notification:  12/21/2015  Time of notification:  09:43  Critical value read back:Yes.    Nurse who received alert:  Shelba Flake, RN  MD notified (1st page): Stanford Breed, MD  Time of first page:  09:54  Responding MD: Stanford Breed, MD   Time MD responded:  09:54

## 2015-12-21 NOTE — ED Notes (Signed)
Admitting at the bedside.  Made aware of low blood pressure.

## 2015-12-21 NOTE — Progress Notes (Signed)
   Subjective:    Patient ID: [name], female    DOB: September 05, 1955, 60 y.o.   MRN: [id number]  HPI Here for 3 days of a painful red lesion on the left lower leg. No insect bites that she remembers but she spent last weekend painting around her deck outside. No fever or myalgias.    Review of Systems  Constitutional: Negative.   Respiratory: Negative.   Cardiovascular: Negative.   Skin: Positive for wound.  Neurological: Negative.        Objective:   Physical Exam  Constitutional: She appears well-developed and well-nourished. No distress.  Cardiovascular: Normal rate, regular rhythm, normal heart sounds and intact distal pulses.   Pulmonary/Chest: Effort normal and breath sounds normal.  Skin:  Left lower leg has a red tender papular lesion           Assessment & Plan:  Spider bite, treat with Doxycycline.  Laurey Morale, MD

## 2015-12-21 NOTE — Progress Notes (Signed)
Called by nursing for K level of 2.9 and pleuritic CP - relieved by leaning forward. This is same pain she had in ER relieved with Toradol and Vicodin. Parameters including sed rate suggestive of inflammation. CTA neg for pericardial effusion. 2D echo pending. EKG generally unchanged. K 2.7 on adm -> only got 31meq in ER.  Plan: - rx KCl 54meq q2hr x 3 doses - Add Mg level - Check K around 1pm - wrote care order to call rounding team with these lab results - order troponins - try Toradol x1 now - add PPI given PRN ASA, Toradol  Dayna Dunn PA-C

## 2015-12-22 ENCOUNTER — Other Ambulatory Visit (HOSPITAL_COMMUNITY): Payer: BC Managed Care – PPO

## 2015-12-22 ENCOUNTER — Observation Stay (HOSPITAL_BASED_OUTPATIENT_CLINIC_OR_DEPARTMENT_OTHER): Payer: BC Managed Care – PPO

## 2015-12-22 ENCOUNTER — Telehealth: Payer: Self-pay | Admitting: Physician Assistant

## 2015-12-22 DIAGNOSIS — I4891 Unspecified atrial fibrillation: Secondary | ICD-10-CM

## 2015-12-22 LAB — BASIC METABOLIC PANEL
Anion gap: 4 — ABNORMAL LOW (ref 5–15)
BUN: 12 mg/dL (ref 6–20)
CALCIUM: 8.2 mg/dL — AB (ref 8.9–10.3)
CO2: 19 mmol/L — AB (ref 22–32)
Chloride: 110 mmol/L (ref 101–111)
Creatinine, Ser: 0.9 mg/dL (ref 0.44–1.00)
GFR calc Af Amer: >60 mL/min (ref >60)
GLUCOSE: 89 mg/dL (ref 65–99)
Potassium: 4.5 mmol/L (ref 3.5–5.1)
Sodium: 133 mmol/L — ABNORMAL LOW (ref 135–145)

## 2015-12-22 LAB — CBC
HEMATOCRIT: 29.1 % — AB (ref 36.0–46.0)
HEMATOCRIT: 29.3 % — AB (ref 36.0–46.0)
HEMOGLOBIN: 9.9 g/dL — AB (ref 12.0–15.0)
Hemoglobin: 9.4 g/dL — ABNORMAL LOW (ref 12.0–15.0)
MCH: 28.9 pg (ref 26.0–34.0)
MCH: 29.4 pg (ref 26.0–34.0)
MCHC: 32.3 g/dL (ref 30.0–36.0)
MCHC: 33.8 g/dL (ref 30.0–36.0)
MCV: 89.5 fL (ref 78.0–100.0)
MCV: 86.9 fL (ref 78.0–100.0)
PLATELETS: 177 10*3/uL (ref 150–400)
Platelets: 223 10*3/uL (ref 150–400)
RBC: 3.25 MIL/uL — ABNORMAL LOW (ref 3.87–5.11)
RBC: 3.37 MIL/uL — ABNORMAL LOW (ref 3.87–5.11)
RDW: 12.8 % (ref 11.5–15.5)
RDW: 12.8 % (ref 11.5–15.5)
WBC: 5.4 10*3/uL (ref 4.0–10.5)
WBC: 5.3 10*3/uL (ref 4.0–10.5)

## 2015-12-22 LAB — ECHOCARDIOGRAM COMPLETE
Height: 68 in
Weight: 2598.4 oz

## 2015-12-22 MED ORDER — OFF THE BEAT BOOK
Freq: Once | Status: AC
  Administered 2015-12-22: 08:00:00
  Filled 2015-12-22: qty 1

## 2015-12-22 MED ORDER — AMIODARONE HCL 400 MG PO TABS: 400.0000 mg | ORAL_TABLET | Freq: Two times a day (BID) | ORAL | 0 refills | Status: DC

## 2015-12-22 MED ORDER — IBUPROFEN 800 MG PO TABS: 800.0000 mg | ORAL_TABLET | Freq: Three times a day (TID) | ORAL | Status: DC | PRN

## 2015-12-22 MED ORDER — APIXABAN 5 MG PO TABS: 5.0000 mg | ORAL_TABLET | Freq: Two times a day (BID) | ORAL | 11 refills | Status: DC

## 2015-12-22 MED ORDER — PANTOPRAZOLE SODIUM 40 MG PO TBEC: 40.0000 mg | DELAYED_RELEASE_TABLET | Freq: Every day | ORAL | 1 refills | Status: DC

## 2015-12-22 MED ORDER — ONDANSETRON HCL 4 MG PO TABS: 4.0000 mg | ORAL_TABLET | Freq: Two times a day (BID) | ORAL | Status: DC | PRN

## 2015-12-22 MED ORDER — AMIODARONE HCL 200 MG PO TABS: 200.0000 mg | ORAL_TABLET | Freq: Every day | ORAL | 3 refills | Status: DC

## 2015-12-22 MED ORDER — AMIODARONE HCL 200 MG PO TABS: 200.0000 mg | ORAL_TABLET | Freq: Every day | ORAL | Status: DC

## 2015-12-22 MED ORDER — ONDANSETRON HCL 4 MG PO TABS: 4.0000 mg | ORAL_TABLET | Freq: Two times a day (BID) | ORAL | 0 refills | Status: DC | PRN

## 2015-12-22 MED ORDER — IBUPROFEN 800 MG PO TABS: 800.0000 mg | ORAL_TABLET | Freq: Three times a day (TID) | ORAL | 1 refills | Status: DC | PRN

## 2015-12-22 MED ORDER — APIXABAN 5 MG PO TABS: 5.0000 mg | ORAL_TABLET | Freq: Two times a day (BID) | ORAL | 0 refills | Status: DC

## 2015-12-22 MED ORDER — DOXYCYCLINE HYCLATE 100 MG PO TABS: 100.0000 mg | ORAL_TABLET | Freq: Two times a day (BID) | ORAL | 0 refills | Status: AC

## 2015-12-22 NOTE — Telephone Encounter (Signed)
Called after hours to provide work note for patient and clearance per her request to return 12/28/15. Appears she had a hospital course with numerous issues. I am not familiar with the patient and do not feel comfortable clearing her without having participated in her care. I provided a work note to remain out of work until cleared by her cardiologist until she hears back from our office. Will forward this msg to Dr. Stanford Breed and his nurse to decide further plan and convey this to the patient.  Rayshad Riviello PA-C

## 2015-12-22 NOTE — Progress Notes (Signed)
    Subjective:  Denies dyspnea; complains of chest pain with inspiration improving.   Objective:  Vitals:   12/21/15 2100 12/22/15 0354 12/22/15 0400 12/22/15 0733  BP: (!) 96/55 (!) 87/53 90/62 (!) 89/55  Pulse: 80 76    Resp:  20    Temp: 99.3 F (37.4 C) 99.6 F (37.6 C)    TempSrc: Oral Oral    SpO2: 96% 95%    Weight:  162 lb 6.4 oz (73.7 kg)    Height:        Intake/Output from previous day:  Intake/Output Summary (Last 24 hours) at 12/22/15 0948 Last data filed at 12/22/15 0358  Gross per 24 hour  Intake          1138.33 ml  Output              375 ml  Net           763.33 ml    Physical Exam: Physical exam: Well-developed well-nourished in no acute distress.  Skin is warm and dry.  HEENT is normal.  Neck is supple.  Chest is clear to auscultation with normal expansion.  Cardiovascular exam is RRR Abdominal exam nontender or distended. No masses palpated. Extremities show no edema. Previous insect bites with erythema noted LLE neuro grossly intact    Lab Results: Basic Metabolic Panel:  Recent Labs  12/21/15 0810  12/21/15 1847 12/22/15 0347  NA  --   --  134* 133*  K  --   < > 4.4 4.5  CL  --   --  111 110  CO2  --   --  20* 19*  GLUCOSE  --   --  128* 89  BUN  --   --  14 12  CREATININE  --   --  0.93 0.90  CALCIUM  --   --  8.2* 8.2*  MG 1.8  --   --   --   < > = values in this interval not displayed. CBC:  Recent Labs  12/20/15 1823 12/22/15 0347  WBC 11.1* 5.4  HGB 11.9* 9.4*  HCT 35.1* 29.1*  MCV 87.3 89.5  PLT 199 177     Assessment/Plan:  1 PAF-Patient has converted to sinus rhythm. TSH normal; await echo. BP borderline and therefore cannot treat with calcium blocker or beta blocker. Continue amiodarone 400 mg BID for 2 weeks and then 200 mg daily. Patient appears to have pericarditis which may have caused atrial fibrillation; hopefully arrhythmia will not recur once this improves. Would plan to DC amiodarone in 8 weeks.  Continue apixaban (CHADSvasc 2). Hgb decreased this AM; no signs of active bleeding; repeat Hgb this PM.  2 Possible pericarditis-await echo; ESR 80; treat with NSAID for now. 3 Hypokalemia-improved. 4 insect bite-continue doxycycline. 5 Hyperglycemia-await Hgb A1C.  If echo normal and hemoglobin stable, DC later today and fu with me in 2-4 weeks.  Kirk Ruths 12/22/2015, 9:48 AM

## 2015-12-22 NOTE — Discharge Summary (Signed)
Physician Discharge Summary       Patient ID: [name] MRN: [id number] DOB/AGE: May 03, 1956 60 y.o.  Admit date: 12/20/2015 Discharge date: 12/22/2015 Primary Cardiologist:Dr. Stanford Breed   Discharge Diagnoses:  Principal Problem:   Atrial fibrillation with rapid ventricular response [facility]) Active Problems:   Essential hypertension   Cellulitis   Hypokalemia   PAF (paroxysmal atrial fibrillation) ([place])   Discharged Condition: good  Procedures: ECHO 12/22/15 Study Conclusions  - Left ventricle: The cavity size was normal. Wall thickness was   increased in a pattern of mild LVH. Systolic function was normal.   The estimated ejection fraction was in the range of 60% to 65%.   Wall motion was normal; there were no regional wall motion   abnormalities. Doppler parameters are consistent with   pseudonormal left ventricular relaxation (grade 2 diastolic   dysfunction). The E/e&' ratio is >15, suggesting elevated LV   filling pressure. - Mitral valve: Mildly thickened leaflets . There was trivial   regurgitation. - Left atrium: The atrium was at the upper limits of normal in   size. - Right ventricle: The cavity size was normal. Wall thickness was   normal. The moderator band was prominent. Systolic function was   normal. - Right atrium: The atrium was mildly dilated. - Tricuspid valve: There was moderate regurgitation. - Pulmonary arteries: PA peak pressure: 45 mm Hg (S). - Inferior vena cava: The vessel was normal in size. The   respirophasic diameter changes were in the normal range (>= 50%),   consistent with normal central venous pressure.  Impressions:  - LVEF 60-65%, mild LVH, normal wall motion, Grade 2 DD, elevated   LV filling pressure, trivial MR, mild LAE, moderate TR, RVSP 45   mmHg, normal IVC.  Hospital Course:  60 year old female, previously has been in good health with history of an intracranial aneurysm clipped in number of years ago.  She has a  history of hypertension and has had some previous enteritis and has been hypokalemic in the past thought due to GI causes.  About a week ago she was working outside and had 2 spider bites on her leg.  They became somewhat red and inflamed with a central eschar and she saw her primary doctor on Friday and was placed on doxycycline.  12/20/15 she had the onset of some mild substernal chest discomfort shortness of breath and weakness, the discomfort was described as somewhat sharp radiating through to her back and worse when she laid down.  She came to the emergency room 12/21/15 and had a CT angiogram that was unremarkable.  She was found to be severely hypokalemic and was in rapid atrial fibrillation.  She was given diltiazem but has developed some hypotension.  Chest discomfort improved from that on admission with slowing of her heart rate and initial troponin was negative.  She has continued to have chest discomfort and the discomfort was better when she sat forward on admit.  She had not really been in any acute distress.  She has not had any significant fever.  She normally is active and denies exertional chest pain.  She had no PND, orthopnea or edema.  She was admitted to go on Dilt drip but her BP was too low.  Her BP remained low and amiodarone was started.  She continued with hypokalemia and hypo-magnesium and both were replaced several times.    It was felt she had pericarditis.  Sed rate of 80.    With her (CHADSvasc 2)  her heparin IV was stopped and pt placed on Eliquis.   For her pericarditis she was placed on NASIDS- and will be on PRN.    On the evening of 12/21/15 she converted to SR. EKG was stable.     The morning of 12/22/15  Her K+ was 4.5, her mag was stable with total of 4 Gms IV magnesium given.  Troponin minimally elevated at 0.05 with hypotension and a fib RVR this is expected.    Her Hgb did drop to 9.4 from 11.9.  No active bleeding, will recheck prior to discharge.  Echo as above.     Pt seen and evaluated by Dr. Stanford Breed and found stable for discharge.  She will follow up him 2-4 weeks.   She has hyperglycemia and hgbA1C is pending    Continue amiodarone 400 mg BID for 2 weeks and then 200 mg daily. Patient appears to have pericarditis which may have caused atrial fibrillation; hopefully arrhythmia will not recur once this improves. Would plan to DC amiodarone in 8 weeks. Continue apixaban (CHADSvasc 2).  She is on Protonix 40 mg daily.    She is nauseated with the doxycyline for the cellulitis - have added po zofran to take before doxycyline also changed to Vibra tabs.  She will continue until the 7th- then follow up with PCP.    Consults: cardiology  Significant Diagnostic Studies:  BMP Latest Ref Rng & Units 12/22/2015 12/21/2015 12/21/2015  Glucose 65 - 99 mg/dL 89 128(H) -  BUN 6 - 20 mg/dL 12 14 -  Creatinine 0.44 - 1.00 mg/dL 0.90 0.93 -  Sodium 135 - 145 mmol/L 133(L) 134(L) -  Potassium 3.5 - 5.1 mmol/L 4.5 4.4 3.7  Chloride 101 - 111 mmol/L 110 111 -  CO2 22 - 32 mmol/L 19(L) 20(L) -  Calcium 8.9 - 10.3 mg/dL 8.2(L) 8.2(L) -   CBC Latest Ref Rng & Units 12/22/2015 12/22/2015 12/20/2015  WBC 4.0 - 10.5 K/uL 5.3 5.4 11.1(H)  Hemoglobin 12.0 - 15.0 g/dL 9.9(L) 9.4(L) 11.9(L)  Hematocrit 36.0 - 46.0 % 29.3(L) 29.1(L) 35.1(L)  Platelets 150 - 400 K/uL 223 177 199   Ddimer  2.57, CTA neg for PE  Troponin 0.00, 0.04, 0.05, < 0.03.    Hepatic Function Latest Ref Rng & Units 12/21/2015 06/04/2015 06/03/2015  Total Protein 6.5 - 8.1 g/dL 6.9 6.6 9.3(H)  Albumin 3.5 - 5.0 g/dL 3.0(L) 2.8(L) 4.2  AST 15 - 41 U/L 17 11(L) 19  ALT 14 - 54 U/L '17 14 24  ' Alk Phosphatase 38 - 126 U/L 73 55 73  Total Bilirubin 0.3 - 1.2 mg/dL 1.7(H) 1.5(H) 1.2  Bilirubin, Direct 0.0 - 0.3 mg/dL - - -   TSH  0.487  CT angio of chest  FINDINGS: Cardiovascular: There are no filling defects within the pulmonary arteries to suggest pulmonary embolus. No thoracic aortic dissection or  aneurysm. There is a conventional branching pattern from the aortic arch. Mediastinum/Nodes: Scattered soft tissue density in the prevascular space likely small nodes. No additional mediastinal adenopathy. No hilar adenopathy. Prominent axillary lymph nodes retained normal fatty hila. The esophagus is decompressed. No pericardial effusion. Lungs/Pleura: Mild bilateral dependent atelectasis. Additional linear atelectasis in both lower lobes. No parenchymal consolidation. No findings of pulmonary edema. No pleural fluid. No pulmonary mass or suspicious nodule. Upper Abdomen: No acute abnormality. Musculoskeletal: There are no acute or suspicious osseous abnormalities. Degenerative change throughout spine. Review of the MIP images confirms the above findings. IMPRESSION: 1. No  pulmonary embolus. 2. Lower lobe atelectasis. Otherwise no acute intrathoracic process.  CHEST  2 VIEW COMPARISON:  None. FINDINGS: The heart size and mediastinal contours are within normal limits. Both lungs are clear. Slight elevation of the right hemidiaphragm. Mild degenerative spurring within the lower thoracic spine and upper lumbar spine. No acute or suspicious osseous finding. IMPRESSION: No active cardiopulmonary disease.  Discharge Exam: Blood pressure (!) 88/57, pulse 67, temperature 98.7 F (37.1 C), temperature source Oral, resp. rate 20, height '5\' 8"'  (1.727 m), weight 162 lb 6.4 oz (73.7 kg), SpO2 97 %.  Disposition: 01-Home or Self Care     Medication List    STOP taking these medications   aspirin 325 MG EC tablet   doxycycline 100 MG capsule Commonly known as:  VIBRAMYCIN Replaced by:  doxycycline 100 MG tablet   NIFEdipine 90 MG 24 hr tablet Commonly known as:  ADALAT CC     TAKE these medications   acetaminophen 500 MG tablet Commonly known as:  TYLENOL Take 1,000 mg by mouth daily as needed.   amiodarone 400 MG tablet Commonly known as:  PACERONE Take 1 tablet (400 mg  total) by mouth 2 (two) times daily.   amiodarone 200 MG tablet Commonly known as:  PACERONE Take 1 tablet (200 mg total) by mouth daily. Start taking on:  01/06/2016   apixaban 5 MG Tabs tablet Commonly known as:  ELIQUIS Take 1 tablet (5 mg total) by mouth 2 (two) times daily.   doxycycline 100 MG tablet Commonly known as:  VIBRA-TABS Take 1 tablet (100 mg total) by mouth 2 (two) times daily. Replaces:  doxycycline 100 MG capsule   ibuprofen 800 MG tablet Commonly known as:  ADVIL,MOTRIN Take 1 tablet (800 mg total) by mouth every 8 (eight) hours as needed (chest pain related to pericarditis.).   ondansetron 4 MG tablet Commonly known as:  ZOFRAN Take 1 tablet (4 mg total) by mouth every 12 (twelve) hours as needed for nausea or vomiting (may use with antibiotic).   pantoprazole 40 MG tablet Commonly known as:  PROTONIX Take 1 tablet (40 mg total) by mouth daily at 6 (six) AM.   zolpidem 10 MG tablet Commonly known as:  AMBIEN Take 1 tablet (10 mg total) by mouth at bedtime. What changed:  how much to take      Follow-up Information    Kirk Ruths, MD Follow up on 01/18/2016.   Specialty:  Cardiology Why:  at 11:15 AM with Dr. Leroy Libman information: [address] [place] [address] [phone]            Discharge Instructions:  Heart Healthy Diet  Do not miss the Eliquis   The amiodarone you will take 400 mg twice a day for 2 weeks last day 01/05/16 and beginning 01/06/16 you will begin amiodarone 200 mg. ONCE   Daily.    You may take Ibuprofen 800 mg every 8 hours as needed for chest pain.    Call the office if any bleeding teeth, urine or stool.    Call if any questions or problems.  We stopped the asprin as you are now on Eliquis.  Signed: Cecilie Kicks Nurse Practitioner-Certified [facility]: [facility] 12/22/2015, 5:07 PM  Time spent on discharge :> 30  minutes.

## 2015-12-22 NOTE — Progress Notes (Signed)
  Echocardiogram 2D Echocardiogram has been performed.  Bobbye Charleston 12/22/2015, 11:08 AM

## 2015-12-23 ENCOUNTER — Encounter: Payer: Self-pay | Admitting: *Deleted

## 2015-12-23 LAB — HEMOGLOBIN A1C
HEMOGLOBIN A1C: 5.3 % (ref 4.8–5.6)
MEAN PLASMA GLUCOSE: 105 mg/dL

## 2015-12-23 NOTE — Telephone Encounter (Signed)
[name] is calling to get a return to work note . She wants to go back to work on 12/25/15 . Please call   Thanks

## 2015-12-23 NOTE — Telephone Encounter (Signed)
Spoke with pt, she has decided to return to work Friday. Will generate letter and place at the front desk for pt pick up.

## 2016-01-01 ENCOUNTER — Telehealth: Payer: Self-pay | Admitting: Cardiology

## 2016-01-01 NOTE — Telephone Encounter (Signed)
Patient c/o Palpitations:  High priority if patient c/o lightheadedness and shortness of breath.  1. How long have you been having palpitations? 12/26/15  2. Are you currently experiencing lightheadedness and shortness of breath? no  3. Have you checked your BP and heart rate? (document readings) 56    57  Her regular is 77  4. Are you experiencing any other symptoms?no

## 2016-01-01 NOTE — Telephone Encounter (Signed)
Returned call to patient: Patient wanted to make sure her HR of 56-57 was not too low. She was recently d/c from hospital and is taking 400mg  amiodarone BID and will decrease to 200mg  on 8/16.  Pt reports she is feeling okay, denies palpitations, dizziness, CP, SOB.  Pt reports she is unable to take her BP at home-does not have cuff but plans to get one.  Advised patient to take pulse manually-HR 59.   Advised patient this HR is okay, nothing to be concerned about at this time but advised to call if she begins to have any concerning symptoms.  Pt verbalized understanding and encouraged to call with any further questions/concerns.    F/u appt confirmed for 8/28 @ 11:15 with MD Crenshaw at [facility] location.

## 2016-01-15 NOTE — Progress Notes (Signed)
HPI: 60 year old female for follow-up of atrial fibrillation. She was recently admitted following an insect bite and was noted to be in atrial fibrillation. She was felt to have pericarditis as well. CTA July 2017 showed no pulmonary embolus.  Echocardiogram August 2017 showed normal LV systolic function, grade 2 diastolic dysfunction, mild right atrial enlargement and moderate tricuspid regurgitation. Patient was placed on amiodarone as her blood pressure was borderline. However it was felt this would not need to be continued long-term as atrial arrhythmia was felt likely secondary to her pericarditis. TSH normal. Since she was last seen Patient denies dyspnea, chest pain, palpitations or syncope. No bleeding.  Current Outpatient Prescriptions  Medication Sig Dispense Refill  . acetaminophen (TYLENOL) 500 MG tablet Take 1,000 mg by mouth daily as needed.    [name] apixaban (ELIQUIS) 5 MG TABS tablet Take 1 tablet (5 mg total) by mouth 2 (two) times daily. 60 tablet 11  . ibuprofen (ADVIL,MOTRIN) 800 MG tablet Take 1 tablet (800 mg total) by mouth every 8 (eight) hours as needed (chest pain related to pericarditis.). 30 tablet 1  . ondansetron (ZOFRAN) 4 MG tablet Take 1 tablet (4 mg total) by mouth every 12 (twelve) hours as needed for nausea or vomiting (may use with antibiotic). 20 tablet 0  . pantoprazole (PROTONIX) 40 MG tablet Take 1 tablet (40 mg total) by mouth daily at 6 (six) AM. 30 tablet 1  . zolpidem (AMBIEN) 10 MG tablet Take 1 tablet (10 mg total) by mouth at bedtime. (Patient taking differently: Take 5 mg by mouth at bedtime. ) 15 tablet 5  . amiodarone (PACERONE) 200 MG tablet Take 200 mg by mouth daily.  3   No current facility-administered medications for this visit.      Past Medical History:  Diagnosis Date  . Acute renal failure (ARF) ([place])   . Headache(784.0)   . Hypertension   . Insomnia   . Intracranial aneurysm    status post surgery    Past Surgical History:    Procedure Laterality Date  . ABDOMINAL HYSTERECTOMY    . CEREBRAL ANEURYSM REPAIR  1998 bt dr Sherwood Gambler  . OVARIAN CYST REMOVAL      Social History   Social History  . Marital status: Single    Spouse name: N/A  . Number of children: 1  . Years of education: N/A   Occupational History  . [employer]   Social History Main Topics  . Smoking status: Never Smoker  . Smokeless tobacco: Never Used  . Alcohol use 0.0 oz/week     Comment: social  . Drug use: No  . Sexual activity: Not on file   Other Topics Concern  . Not on file   Social History Narrative  . No narrative on file    Family History  Problem Relation Age of Onset  . Hypertension Mother   . Cholelithiasis Mother   . Scoliosis Mother   . Arthritis Mother   . Sudden death Father   . Aneurysm Father     brain  . Anuerysm Sister     brain  . Aneurysm Maternal Uncle   . Cholelithiasis Daughter   . Colon cancer Neg Hx   . Esophageal cancer Neg Hx   . Pancreatic cancer Neg Hx   . Rectal cancer Neg Hx   . Stomach cancer Neg Hx     ROS: no fevers or chills, productive cough, hemoptysis, dysphasia,  odynophagia, melena, hematochezia, dysuria, hematuria, rash, seizure activity, orthopnea, PND, pedal edema, claudication. Remaining systems are negative.  Physical Exam: Well-developed well-nourished in no acute distress.  Skin is warm and dry.  HEENT is normal.  Neck is supple.  Chest is clear to auscultation with normal expansion.  Cardiovascular exam is regular rate and rhythm.  Abdominal exam nontender or distended. No masses palpated. Extremities show no edema. neuro grossly intact  ECG  A/P Paroxysmal atrial fibrillation-the patient remains in sinus rhythm on examination. Previous episode was felt secondary to insect bite leading to pericarditis. I do not think she requires amiodarone long-term. We will discontinue this medication and follow-up. Continue apixaban for now. Follow-up  with extender in 6 weeks. If she remains in sinus rhythm with discontinued apixaban at that time. I do not think we should commit patient to long-term anticoagulation based on one episode of atrial fibrillation that was likely secondary to pericarditis.  2 hypertension-Procardia was discontinued in the hospital. Blood pressure remains controlled. Follow blood pressure and add medications later as needed.  3 hypokalemia-we'll plan to repeat potassium today.  4 anemia-repeat hemoglobin. Discontinue Protonix.  5 pericarditis-resolved.  Kirk Ruths, MD

## 2016-01-18 ENCOUNTER — Ambulatory Visit (INDEPENDENT_AMBULATORY_CARE_PROVIDER_SITE_OTHER): Payer: BC Managed Care – PPO | Admitting: Cardiology

## 2016-01-18 ENCOUNTER — Encounter: Payer: Self-pay | Admitting: Cardiology

## 2016-01-18 VITALS — BP 124/70 | HR 63 | Ht 68.0 in | Wt 157.0 lb

## 2016-01-18 DIAGNOSIS — I10 Essential (primary) hypertension: Secondary | ICD-10-CM

## 2016-01-18 DIAGNOSIS — D649 Anemia, unspecified: Secondary | ICD-10-CM | POA: Diagnosis not present

## 2016-01-18 DIAGNOSIS — I480 Paroxysmal atrial fibrillation: Secondary | ICD-10-CM

## 2016-01-18 DIAGNOSIS — Z79899 Other long term (current) drug therapy: Secondary | ICD-10-CM

## 2016-01-18 DIAGNOSIS — E876 Hypokalemia: Secondary | ICD-10-CM

## 2016-01-18 LAB — CBC WITH DIFFERENTIAL/PLATELET
BASOS PCT: 0 %
Basophils Absolute: 0 cells/uL (ref 0–200)
EOS ABS: 189 {cells}/uL (ref 15–500)
EOS PCT: 3 %
HEMATOCRIT: 36.8 % (ref 35.0–45.0)
Hemoglobin: 12.2 g/dL (ref 11.7–15.5)
LYMPHS PCT: 18 %
Lymphs Abs: 1134 cells/uL (ref 850–3900)
MCH: 29.1 pg (ref 27.0–33.0)
MCHC: 33.2 g/dL (ref 32.0–36.0)
MCV: 87.8 fL (ref 80.0–100.0)
MONOS PCT: 10 %
MPV: 9.7 fL (ref 7.5–12.5)
Monocytes Absolute: 630 cells/uL (ref 200–950)
Neutro Abs: 4347 cells/uL (ref 1500–7800)
Neutrophils Relative %: 69 %
Platelets: 227 10*3/uL (ref 140–400)
RBC: 4.19 MIL/uL (ref 3.80–5.10)
RDW: 13.3 % (ref 11.0–15.0)
WBC: 6.3 10*3/uL (ref 3.8–10.8)

## 2016-01-18 LAB — BASIC METABOLIC PANEL
BUN: 19 mg/dL (ref 7–25)
CHLORIDE: 103 mmol/L (ref 98–110)
CO2: 26 mmol/L (ref 20–31)
CREATININE: 1.05 mg/dL (ref 0.50–1.05)
Calcium: 9.2 mg/dL (ref 8.6–10.4)
GLUCOSE: 94 mg/dL (ref 65–99)
Potassium: 4.8 mmol/L (ref 3.5–5.3)
Sodium: 136 mmol/L (ref 135–146)

## 2016-01-18 NOTE — Patient Instructions (Signed)
Medication Instructions:  Your physician has recommended you make the following change in your medication:  1- STOP TAKING Amiodarone. 2- STOP TAKING Protonix.   Labwork: Your physician recommends that you return for lab work in: [place].   Follow-Up: We request that you follow-up in: 6 WEEKS with an extender and in 3 MONTHS with Dr Stanford Breed.  You will receive a reminder letter in the mail two months in advance. If you don't receive a letter, please call our office to schedule the follow-up appointment.   If you need a refill on your cardiac medications before your next appointment, please call your pharmacy.

## 2016-01-19 ENCOUNTER — Telehealth: Payer: Self-pay | Admitting: Cardiology

## 2016-01-19 NOTE — Telephone Encounter (Signed)
Returning call,concerning lab results.

## 2016-01-19 NOTE — Telephone Encounter (Signed)
Spoke to patient. Cbc,bmp Result given . Verbalized understanding

## 2016-02-04 ENCOUNTER — Telehealth: Payer: Self-pay | Admitting: Family Medicine

## 2016-02-04 NOTE — Telephone Encounter (Signed)
° °  Pt will go to [facility] the week of 02/29/16 for her cpx labs . place order please

## 2016-02-05 NOTE — Telephone Encounter (Signed)
Per Dr. Sarajane Jews okay to order.

## 2016-02-08 NOTE — Telephone Encounter (Signed)
Actually she has already had all the labs I would normally order in late July or August, so no new labs are needed

## 2016-02-08 NOTE — Telephone Encounter (Signed)
Looks like pt had labs done recently, what else do we need to order?

## 2016-02-09 NOTE — Telephone Encounter (Signed)
I left a voice message for pt with below information.  

## 2016-02-29 ENCOUNTER — Ambulatory Visit (INDEPENDENT_AMBULATORY_CARE_PROVIDER_SITE_OTHER): Payer: BC Managed Care – PPO | Admitting: Physician Assistant

## 2016-02-29 ENCOUNTER — Encounter: Payer: Self-pay | Admitting: Physician Assistant

## 2016-02-29 VITALS — BP 140/77 | HR 62 | Ht 68.0 in | Wt 160.4 lb

## 2016-02-29 DIAGNOSIS — I480 Paroxysmal atrial fibrillation: Secondary | ICD-10-CM

## 2016-02-29 DIAGNOSIS — Z7901 Long term (current) use of anticoagulants: Secondary | ICD-10-CM | POA: Diagnosis not present

## 2016-02-29 DIAGNOSIS — I10 Essential (primary) hypertension: Secondary | ICD-10-CM

## 2016-02-29 MED ORDER — NIFEDIPINE ER 30 MG PO TB24: 30.0000 mg | ORAL_TABLET | Freq: Every day | ORAL | 9 refills | Status: DC

## 2016-02-29 NOTE — Patient Instructions (Addendum)
Medication Instructions:  START- NIFEdipine 30 mg daily  Labwork: None Ordered  Testing/Procedures: None Ordered  Follow-Up: Your physician recommends that you schedule a follow-up appointment in: Keep appointment on December 4th @ 4:30 pm    Any Other Special Instructions Will Be Listed Below (If Applicable).   If you need a refill on your cardiac medications before your next appointment, please call your pharmacy.

## 2016-02-29 NOTE — Progress Notes (Signed)
Cardiology Office Note   Date:  02/29/2016   ID:  [name], DOB [birth year], MRN [id number]  PCP:  Kristin Morale, MD  Cardiologist:  Dr Alcide Evener, PA-C   Chief Complaint  Patient presents with  . Follow-up    6 weeks; Pt states no Sx    History of Present Illness: [name] is a 60 y.o. female with a history of atrial fib and pericarditis dx 12/2015 when she was evaluated after an insect bite, Amio added Initially but was d/c'd 08/28 ov, TSH nl. Hx ARF 05/2015 2nd GI illness, HTN, intracranial aneurysm s/p surgery [place] presents for Ongoing cardiology follow-up and evaluation.  She has not had any palpitations. Because she was so otherwise ill at the time of her atrial fibrillation, she is not sure that she would know if she were having it, but she believes that she would. She has not been lightheaded or dizzy.   She is concerned that she was on Procardia at 60 mg daily, but this was discontinued. Her blood pressure has been running up higher than normal for her and she is worried about that because of her aneurysm history and being on blood thinners.  She has not had chest pain or shortness of breath. She has not had lower extremity edema, orthopnea or PND. She has not had dyspnea on exertion. Other than her elevated blood pressure, she feels well.   Past Medical History:  Diagnosis Date  . Acute renal failure (ARF) ([place])   . Headache(784.0)   . Hypertension   . Insomnia   . Intracranial aneurysm    status post surgery    Past Surgical History:  Procedure Laterality Date  . ABDOMINAL HYSTERECTOMY    . CEREBRAL ANEURYSM REPAIR  1998   By Dr Sherwood Gambler  . OVARIAN CYST REMOVAL      Current Outpatient Prescriptions  Medication Sig Dispense Refill  . acetaminophen (TYLENOL) 500 MG tablet Take 1,000 mg by mouth daily as needed.    [name] apixaban (ELIQUIS) 5 MG TABS tablet Take 1 tablet (5 mg total) by mouth 2 (two) times daily.  60 tablet 11  . zolpidem (AMBIEN) 10 MG tablet Take 1 tablet (10 mg total) by mouth at bedtime. (Patient taking differently: Take 5 mg by mouth at bedtime. ) 15 tablet 5   No current facility-administered medications for this visit.     Allergies:   Doxycycline    Social History:  The patient  reports that she has never smoked. She has never used smokeless tobacco. She reports that she drinks alcohol. She reports that she does not use drugs.   Family History:  The patient's family history includes Aneurysm in her father and maternal uncle; Anuerysm in her sister; Arthritis in her mother; Cholelithiasis in her daughter and mother; Hypertension in her mother; Scoliosis in her mother; Sudden death in her father.    ROS:  Please see the history of present illness. All other systems are reviewed and negative.    PHYSICAL EXAM: VS:  BP 140/77   Pulse 62   Ht 5\' 8"  (1.727 m)   Wt 160 lb 6.4 oz (72.8 kg)   BMI 24.39 kg/m  , BMI Body mass index is 24.39 kg/m. GEN: Well nourished, well developed, female in no acute distress  HEENT: normal for age  Neck: no JVD, no carotid bruit, no masses Cardiac: RRR; soft murmur, no rubs, or gallops Respiratory:  clear  to auscultation bilaterally, normal work of breathing GI: soft, nontender, nondistended, + BS MS: no deformity or atrophy; no edema; distal pulses are 2+ in all 4 extremities   Skin: warm and dry, no rash Neuro:  Strength and sensation are intact Psych: euthymic mood, full affect   EKG:  EKG is not ordered today.  ECHO 123456 normal LV systolic function, grade 2 diastolic dysfunction, mild right atrial enlargement and moderate tricuspid regurgitation  Recent Labs: 12/21/2015: ALT 17; Magnesium 1.8; TSH 0.487 01/18/2016: BUN 19; Creat 1.05; Hemoglobin 12.2; Platelets 227; Potassium 4.8; Sodium 136    Lipid Panel    Component Value Date/Time   CHOL 116 08/27/2014 1002   TRIG 60.0 08/27/2014 1002   HDL 43.40 08/27/2014 1002    CHOLHDL 3 08/27/2014 1002   VLDL 12.0 08/27/2014 1002   LDLCALC 61 08/27/2014 1002   LDLDIRECT 63.0 12/02/2009 0726     Wt Readings from Last 3 Encounters:  02/29/16 160 lb 6.4 oz (72.8 kg)  01/18/16 157 lb (71.2 kg)  12/22/15 162 lb 6.4 oz (73.7 kg)     Other studies Reviewed: Additional studies/ records that were reviewed today include: Hospital records, office notes and testing.  ASSESSMENT AND PLAN:  1.  PAF: She has had no symptoms. She is encouraged to check her blood pressure occasionally and if her heart is eating overly fast or she gets an indication that it is irregular, call us. If she gets palpitations, call us.  2. Hypertension: Her blood pressure has been running a little higher than normal. It may be better to keep her on the low side of normal, so we will start her back on the Procardia, albeit at a decreased dose. She is encouraged to check her blood pressure occasionally, and contact us if it is not well controlled.  3. History of cerebral aneurysm: Because of her history of aneurysms, she is encouraged to follow-up with Dr. Sherwood Gambler as he feels appropriate.  4. Anticoagulation: She is compliant with Xarelto. She is to see Dr. Stanford Breed in December. A decision can be made at that time if the Xarelto should be continued long-term. CHA2DS2VASc=2 (female, HTN)   Current medicines are reviewed at length with the patient today.  The patient does not have concerns regarding medicines.  The following changes have been made:  Add Procardia 30 mg daily  Labs/ tests ordered today include:  No orders of the defined types were placed in this encounter.    Disposition:   FU with Dr. Stanford Breed as scheduled  Signed, Lenoard Aden  02/29/2016 4:16 PM    [facility] Phone: [phone]; Fax: [phone]  This note was written with the assistance of speech recognition software. Please excuse any transcriptional errors.

## 2016-03-01 ENCOUNTER — Encounter: Payer: Self-pay | Admitting: Physician Assistant

## 2016-03-07 ENCOUNTER — Ambulatory Visit (INDEPENDENT_AMBULATORY_CARE_PROVIDER_SITE_OTHER): Payer: BC Managed Care – PPO | Admitting: Family Medicine

## 2016-03-07 ENCOUNTER — Encounter: Payer: Self-pay | Admitting: Family Medicine

## 2016-03-07 VITALS — BP 115/67 | HR 69 | Temp 98.0°F | Ht 68.0 in | Wt 161.0 lb

## 2016-03-07 DIAGNOSIS — Z0000 Encounter for general adult medical examination without abnormal findings: Secondary | ICD-10-CM

## 2016-03-07 NOTE — Progress Notes (Signed)
Pre visit review using our clinic review tool, if applicable. No additional management support is needed unless otherwise documented below in the visit note. 

## 2016-03-07 NOTE — Progress Notes (Signed)
   Subjective:    Patient ID: [name], female    DOB: 03-11-1956, 59 y.o.   MRN: [id number]  HPI 60 yr old female for a well exam. She feels well.     Review of Systems  Constitutional: Negative.   HENT: Negative.   Eyes: Negative.   Respiratory: Negative.   Cardiovascular: Negative.   Gastrointestinal: Negative.   Genitourinary: Negative for decreased urine volume, difficulty urinating, dyspareunia, dysuria, enuresis, flank pain, frequency, hematuria, pelvic pain and urgency.  Musculoskeletal: Negative.   Skin: Negative.   Neurological: Negative.   Psychiatric/Behavioral: Negative.        Objective:   Physical Exam  Constitutional: She is oriented to person, place, and time. She appears well-developed and well-nourished. No distress.  HENT:  Head: Normocephalic and atraumatic.  Right Ear: External ear normal.  Left Ear: External ear normal.  Nose: Nose normal.  Mouth/Throat: Oropharynx is clear and moist. No oropharyngeal exudate.  Eyes: Conjunctivae and EOM are normal. Pupils are equal, round, and reactive to light. No scleral icterus.  Neck: Normal range of motion. Neck supple. No JVD present. No thyromegaly present.  Cardiovascular: Normal rate, regular rhythm, normal heart sounds and intact distal pulses.  Exam reveals no gallop and no friction rub.   No murmur heard. Pulmonary/Chest: Effort normal and breath sounds normal. No respiratory distress. She has no wheezes. She has no rales. She exhibits no tenderness.  Abdominal: Soft. Bowel sounds are normal. She exhibits no distension and no mass. There is no tenderness. There is no rebound and no guarding.  Musculoskeletal: Normal range of motion. She exhibits no edema or tenderness.  Lymphadenopathy:    She has no cervical adenopathy.  Neurological: She is alert and oriented to person, place, and time. She has normal reflexes. No cranial nerve deficit. She exhibits normal muscle tone. Coordination normal.  Skin:  Skin is warm and dry. No rash noted. No erythema.  Psychiatric: She has a normal mood and affect. Her behavior is normal. Judgment and thought content normal.          Assessment & Plan:  Well exam. We discussed diet and exercise. Laurey Morale, MD

## 2016-04-09 ENCOUNTER — Other Ambulatory Visit: Payer: Self-pay | Admitting: Family Medicine

## 2016-04-11 NOTE — Telephone Encounter (Signed)
Call in #[phone] with 5 rf 

## 2016-04-18 ENCOUNTER — Encounter: Payer: Self-pay | Admitting: Cardiology

## 2016-04-20 NOTE — Progress Notes (Signed)
HPI: FU atrial fibrillation. She was previously admitted following an insect bite and was noted to be in atrial fibrillation. She was felt to have pericarditis as well. CTA July 2017 showed no pulmonary embolus.  Echocardiogram August 2017 showed normal LV systolic function, grade 2 diastolic dysfunction, mild right atrial enlargement and moderate tricuspid regurgitation. Patient was placed on amiodarone as her blood pressure was borderline. However it was felt this would not need to be continued long-term as atrial arrhythmia was felt likely secondary to her pericarditis. TSH normal. Amiodarone DCed 8/17. Since she was last seen the patient denies any dyspnea on exertion, orthopnea, PND, pedal edema, palpitations, syncope or chest pain.   Current Outpatient Prescriptions  Medication Sig Dispense Refill  . acetaminophen (TYLENOL) 500 MG tablet Take 1,000 mg by mouth daily as needed.    [name] apixaban (ELIQUIS) 5 MG TABS tablet Take 1 tablet (5 mg total) by mouth 2 (two) times daily. 60 tablet 11  . NIFEdipine (PROCARDIA-XL/ADALAT CC) 30 MG 24 hr tablet Take 1 tablet (30 mg total) by mouth daily. 30 tablet 9  . zolpidem (AMBIEN) 10 MG tablet TAKE 1 TABLET BY MOUTH AT BEDTIME AS NEEDED FOR SLEEP 15 tablet 5   No current facility-administered medications for this visit.      Past Medical History:  Diagnosis Date  . Acute renal failure (ARF) ([place])   . Headache(784.0)   . Hypertension   . Insomnia   . Intracranial aneurysm    status post surgery    Past Surgical History:  Procedure Laterality Date  . ABDOMINAL HYSTERECTOMY    . CEREBRAL ANEURYSM REPAIR  1998   By Dr Sherwood Gambler  . COLONOSCOPY  10/01/2015   per Dr. Henrene Pastor, adenomatous polyps, repeat in 5 yrs   . OVARIAN CYST REMOVAL      Social History   Social History  . Marital status: Single    Spouse name: N/A  . Number of children: 1  . Years of education: N/A   Occupational History  . Deputy [employer]    Social History Main Topics  . Smoking status: Never Smoker  . Smokeless tobacco: Never Used  . Alcohol use 0.0 oz/week     Comment: social  . Drug use: No  . Sexual activity: Not on file   Other Topics Concern  . Not on file   Social History Narrative  . No narrative on file    Family History  Problem Relation Age of Onset  . Hypertension Mother   . Cholelithiasis Mother   . Scoliosis Mother   . Arthritis Mother   . Sudden death Father   . Aneurysm Father     brain  . Anuerysm Sister     brain  . Aneurysm Maternal Uncle   . Cholelithiasis Daughter   . Colon cancer Neg Hx   . Esophageal cancer Neg Hx   . Pancreatic cancer Neg Hx   . Rectal cancer Neg Hx   . Stomach cancer Neg Hx     ROS: no fevers or chills, productive cough, hemoptysis, dysphasia, odynophagia, melena, hematochezia, dysuria, hematuria, rash, seizure activity, orthopnea, PND, pedal edema, claudication. Remaining systems are negative.  Physical Exam: Well-developed well-nourished in no acute distress.  Skin is warm and dry.  HEENT is normal.  Neck is supple.  Chest is clear to auscultation with normal expansion.  Cardiovascular exam is regular rate and rhythm.  Abdominal exam nontender or distended.  No masses palpated. Extremities show no edema. neuro grossly intact  ECG  A/P  1 Paroxysmal atrial fibrillation-patient remains in sinus rhythm on examination. Previous episode was felt secondary to an insect bite leading to pericarditis. Amiodarone was discontinued previously and there is no evidence of recurrent atrial fibrillation. I do not think we need to commit her to long-term anticoagulation based on this one episode. Discontinue apixaban.  2 Hypertension- blood pressure controlled. Continue present medications.  3 Pericarditis-resolved.  Kirk Ruths, MD

## 2016-04-25 ENCOUNTER — Ambulatory Visit (INDEPENDENT_AMBULATORY_CARE_PROVIDER_SITE_OTHER): Payer: BC Managed Care – PPO | Admitting: Cardiology

## 2016-04-25 ENCOUNTER — Encounter: Payer: Self-pay | Admitting: Cardiology

## 2016-04-25 VITALS — BP 122/70 | HR 60 | Ht 68.0 in | Wt 163.0 lb

## 2016-04-25 DIAGNOSIS — I480 Paroxysmal atrial fibrillation: Secondary | ICD-10-CM

## 2016-04-25 DIAGNOSIS — I10 Essential (primary) hypertension: Secondary | ICD-10-CM

## 2016-04-25 NOTE — Patient Instructions (Signed)
Your physician recommends that you schedule a follow-up appointment AS NEEDED.  

## 2016-06-01 ENCOUNTER — Encounter: Payer: Self-pay | Admitting: Adult Health

## 2016-06-01 ENCOUNTER — Ambulatory Visit (INDEPENDENT_AMBULATORY_CARE_PROVIDER_SITE_OTHER): Payer: BC Managed Care – PPO | Admitting: Adult Health

## 2016-06-01 VITALS — BP 138/72 | Temp 98.3°F | Ht 68.0 in | Wt 161.4 lb

## 2016-06-01 DIAGNOSIS — B029 Zoster without complications: Secondary | ICD-10-CM | POA: Diagnosis not present

## 2016-06-01 MED ORDER — VALACYCLOVIR HCL 1 G PO TABS: 1000.0000 mg | ORAL_TABLET | Freq: Three times a day (TID) | ORAL | 0 refills | Status: AC

## 2016-06-01 MED ORDER — TRAMADOL HCL 50 MG PO TABS: 50.0000 mg | ORAL_TABLET | Freq: Two times a day (BID) | ORAL | 0 refills | Status: AC

## 2016-06-01 NOTE — Progress Notes (Signed)
   Subjective:    Patient ID: [name], female    DOB: Aug 19, 1955, 61 y.o.   MRN: [id number]  HPI  61 year old female, patient of Dr. Sarajane Jews, who  has a past medical history of Acute renal failure (ARF) ([place]); Headache(784.0); Hypertension; Insomnia; and Intracranial aneurysm. she presents to the clinic today for a painful rash she has had around her bra line on her bak for the last 3 days. She reports having shingles multiple years ago.   Feels as though the rash is spreading.   She has tried tylenol/motrin which helps with her pain.    Review of Systems  Constitutional: Negative.   Respiratory: Negative.   Cardiovascular: Negative.   Skin: Positive for rash.  All other systems reviewed and are negative.      Objective:   Physical Exam  Constitutional: She is oriented to person, place, and time. She appears well-developed and well-nourished. No distress.  Cardiovascular: Normal rate, regular rhythm, normal heart sounds and intact distal pulses.  Exam reveals no gallop.   No murmur heard. Pulmonary/Chest: Effort normal and breath sounds normal. No respiratory distress. She has no wheezes. She has no rales. She exhibits no tenderness.  Neurological: She is alert and oriented to person, place, and time.  Skin: Skin is warm and dry. Rash noted. She is not diaphoretic.  Multiple dermatomes around T5-T6 on right side of body. Does not cross midline. No crusting noted. Pain with palpation   Psychiatric: She has a normal mood and affect. Her behavior is normal. Judgment and thought content normal.  Nursing note and vitals reviewed.     Assessment & Plan:  1. Herpes zoster without complication - Appears as shingles reactivation. Will treat with Valtrex. Can use Tramadol as needed for break through pain. Tylenol/Motrin as needed - valACYclovir (VALTREX) 1000 MG tablet; Take 1 tablet (1,000 mg total) by mouth 3 (three) times daily.  Dispense: 21 tablet; Refill: 0 - traMADol (ULTRAM) 50  MG tablet; Take 1 tablet (50 mg total) by mouth 2 (two) times daily.  Dispense: 10 tablet; Refill: 0 - Follow up if no improvement  Dorothyann Peng, NP

## 2016-06-13 ENCOUNTER — Ambulatory Visit (INDEPENDENT_AMBULATORY_CARE_PROVIDER_SITE_OTHER): Payer: BC Managed Care – PPO | Admitting: Family Medicine

## 2016-06-13 ENCOUNTER — Encounter: Payer: Self-pay | Admitting: Family Medicine

## 2016-06-13 VITALS — BP 125/73 | HR 87 | Temp 98.5°F | Ht 68.0 in | Wt 157.0 lb

## 2016-06-13 DIAGNOSIS — J019 Acute sinusitis, unspecified: Secondary | ICD-10-CM | POA: Diagnosis not present

## 2016-06-13 MED ORDER — AMOXICILLIN-POT CLAVULANATE 875-125 MG PO TABS: 1.0000 | ORAL_TABLET | Freq: Two times a day (BID) | ORAL | 0 refills | Status: DC

## 2016-06-13 NOTE — Progress Notes (Signed)
Pre visit review using our clinic review tool, if applicable. No additional management support is needed unless otherwise documented below in the visit note. 

## 2016-06-13 NOTE — Progress Notes (Signed)
   Subjective:    Patient ID: [name], female    DOB: 06/08/1955, 61 y.o.   MRN: [id number]  HPI Here for 3 weeks of sinus pressure, PND, blowing green mucus from the nose and a dry cough. On Mucimex.    Review of Systems  Constitutional: Negative.   HENT: Positive for congestion, postnasal drip, sinus pain and sinus pressure. Negative for sore throat.   Eyes: Negative.   Respiratory: Positive for cough.        Objective:   Physical Exam  Constitutional: She is oriented to person, place, and time. She appears well-developed and well-nourished.  HENT:  Right Ear: External ear normal.  Left Ear: External ear normal.  Nose: Nose normal.  Mouth/Throat: Oropharynx is clear and moist.  Eyes: Conjunctivae are normal.  Neck: No thyromegaly present.  Pulmonary/Chest: Effort normal and breath sounds normal.  Lymphadenopathy:    She has no cervical adenopathy.  Neurological: She is alert and oriented to person, place, and time.          Assessment & Plan:  Sinusitis, treat with Augmentin.  Alysia Penna, MD

## 2016-07-22 ENCOUNTER — Other Ambulatory Visit: Payer: Self-pay | Admitting: Family Medicine

## 2016-07-22 DIAGNOSIS — Z1231 Encounter for screening mammogram for malignant neoplasm of breast: Secondary | ICD-10-CM

## 2016-08-29 ENCOUNTER — Ambulatory Visit
Admission: RE | Admit: 2016-08-29 | Discharge: 2016-08-29 | Disposition: A | Discharge status: Home / Self Care | Payer: BC Managed Care – PPO | Source: Ambulatory Visit | Attending: Family Medicine | Admitting: Family Medicine

## 2016-08-29 DIAGNOSIS — Z1231 Encounter for screening mammogram for malignant neoplasm of breast: Secondary | ICD-10-CM

## 2016-09-27 ENCOUNTER — Other Ambulatory Visit: Payer: Self-pay | Admitting: Family Medicine

## 2016-09-27 NOTE — Telephone Encounter (Signed)
Call in #[phone] with 5 rf 

## 2016-12-23 ENCOUNTER — Other Ambulatory Visit: Payer: Self-pay | Admitting: Physician Assistant

## 2016-12-25 ENCOUNTER — Other Ambulatory Visit: Payer: Self-pay | Admitting: Physician Assistant

## 2017-01-05 IMAGING — CR DG ABD PORTABLE 1V
1 series · 1 of 1 positions shown · non-contrast
Comparison: CT scan 06/03/2015

CLINICAL DATA: Abdominal distension.  History of gastroenteritis.

EXAM:
PORTABLE ABDOMEN - 1 VIEW

[AP]
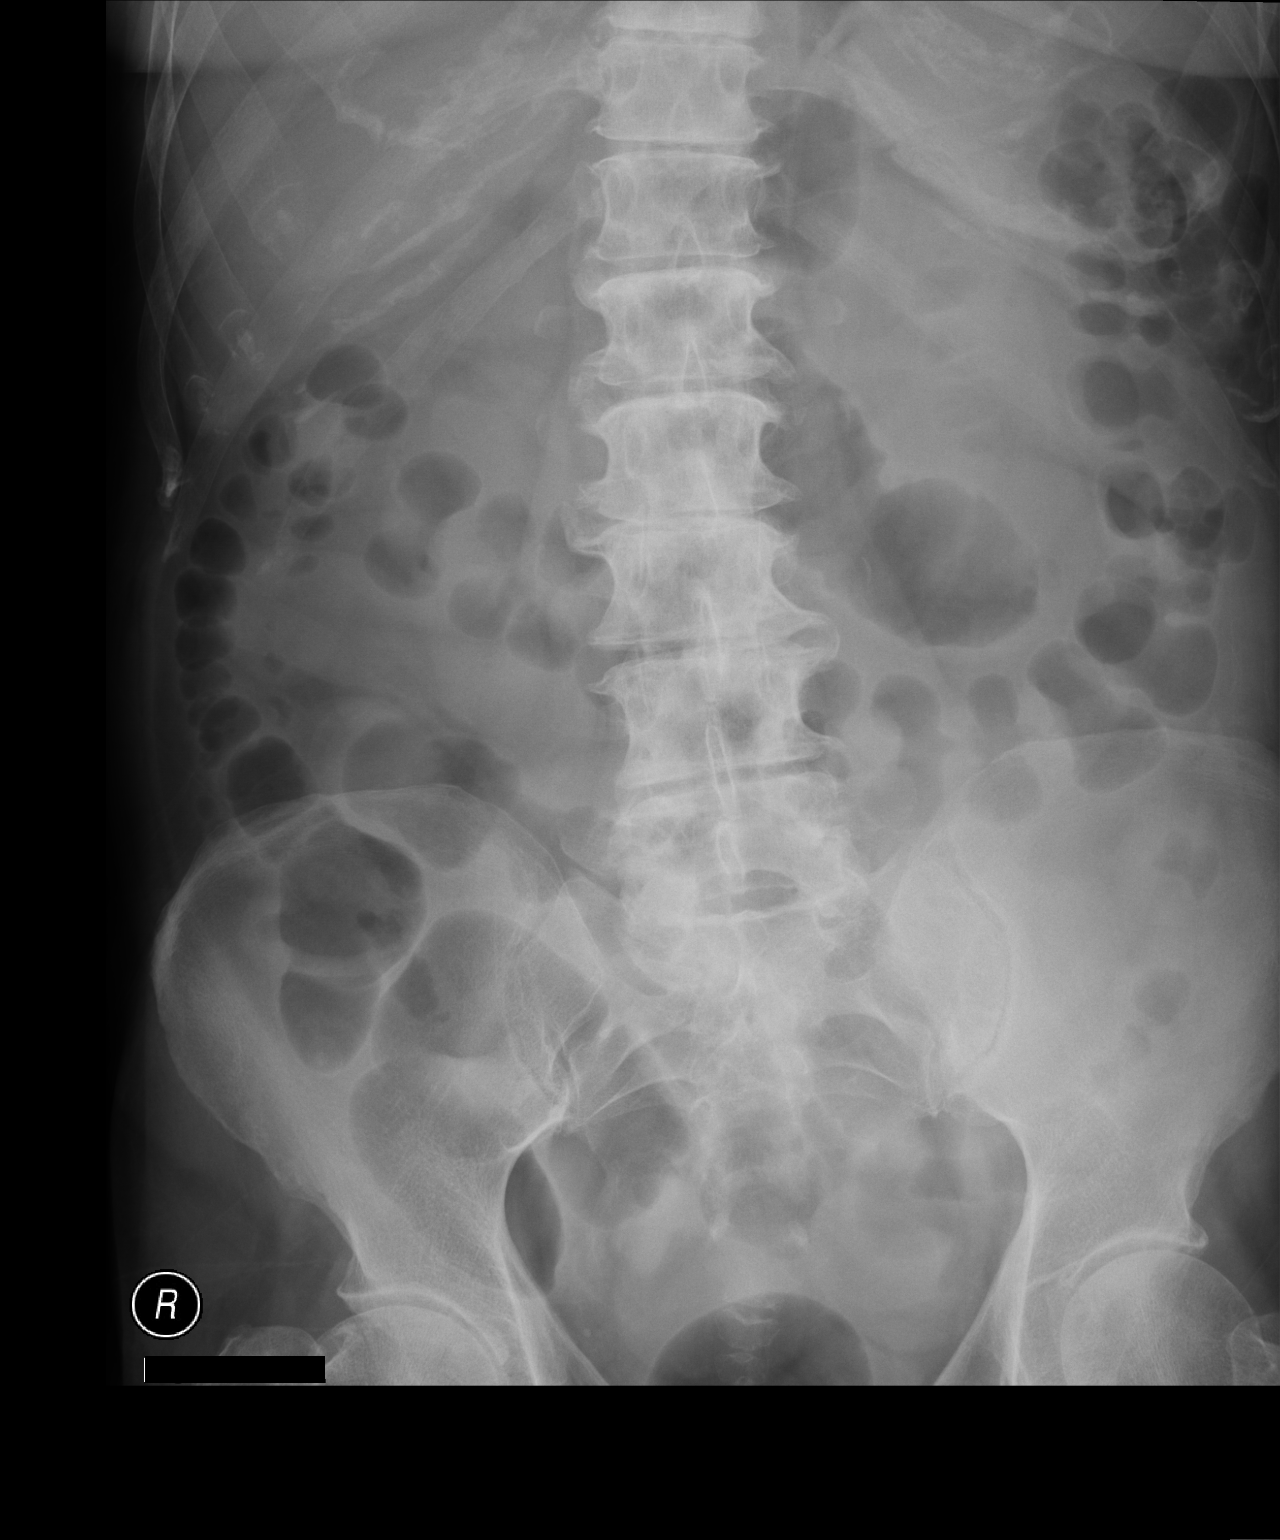

[1 of 1 positions shown; findings below may reference images not displayed]

FINDINGS: Scattered air throughout the colon is noted. There are few mildly
dilated small bowel loops in the left upper quadrant. The soft
tissue shadows are maintained. No worrisome calcifications.
IMPRESSION: Improving bowel gas pattern with air now seen throughout the colon.

## 2017-02-09 ENCOUNTER — Encounter: Payer: Self-pay | Admitting: Family Medicine

## 2017-03-07 ENCOUNTER — Ambulatory Visit (INDEPENDENT_AMBULATORY_CARE_PROVIDER_SITE_OTHER): Payer: BC Managed Care – PPO | Admitting: Family Medicine

## 2017-03-07 ENCOUNTER — Encounter: Payer: Self-pay | Admitting: Family Medicine

## 2017-03-07 VITALS — BP 130/85 | HR 73 | Temp 98.1°F | Ht 68.0 in | Wt 151.0 lb

## 2017-03-07 DIAGNOSIS — F418 Other specified anxiety disorders: Secondary | ICD-10-CM

## 2017-03-07 MED ORDER — ESCITALOPRAM OXALATE 10 MG PO TABS: 10.0000 mg | ORAL_TABLET | Freq: Every day | ORAL | 2 refills | Status: DC

## 2017-03-07 NOTE — Progress Notes (Signed)
   Subjective:    Patient ID: [name], female    DOB: May 20, 1956, 61 y.o.   MRN: [id number]  HPI Here asking for help with depression and anxiety. She has never sought help with these problems before. About 3 weeks ago her mother had a fairly severe stroke which has left her with right arm and leg weakness, speech difficulties, and a form of dementia. She has been in the hospital, and [name] has spent almost 24 hours a day for these 3 weeks with her mother. She worries about her and she feel sad and hopeless. She has trouble sleeping and her appetite is poor. She does use Zolpidem for sleep.    Review of Systems  Constitutional: Negative.   Respiratory: Negative.   Cardiovascular: Negative.   Neurological: Negative.   Psychiatric/Behavioral: Positive for decreased concentration, dysphoric mood and sleep disturbance. Negative for agitation, confusion, hallucinations, self-injury and suicidal ideas. The patient is nervous/anxious.        Objective:   Physical Exam  Constitutional: She is oriented to person, place, and time. She appears well-developed and well-nourished.  Neck: No thyromegaly present.  Cardiovascular: Normal rate, regular rhythm, normal heart sounds and intact distal pulses.   Pulmonary/Chest: Effort normal and breath sounds normal. No respiratory distress. She has no wheezes.  Lymphadenopathy:    She has no cervical adenopathy.  Neurological: She is alert and oriented to person, place, and time.  Psychiatric: Her behavior is normal. Judgment and thought content normal.  Depressed affect, tearful           Assessment & Plan:  Reactive depression with anxiety. We will treat her with Lexapro and I asked her to follow up with [place] in 2 weeks. I urged her to get enough sleep and to exercise daily.  Alysia Penna, MD

## 2017-03-07 NOTE — Patient Instructions (Signed)
WE NOW OFFER   [facility] FAST TRACK!!!  SAME DAY Appointments for ACUTE CARE  Such as: Sprains, Injuries, cuts, abrasions, rashes, muscle pain, joint pain, back pain Colds, flu, sore throats, headache, allergies, cough, fever  Ear pain, sinus and eye infections Abdominal pain, nausea, vomiting, diarrhea, upset stomach Animal/insect bites  3 Easy Ways to Schedule: Walk-In Scheduling Call in scheduling Mychart Sign-up: [email or web address]         

## 2017-03-20 ENCOUNTER — Other Ambulatory Visit: Payer: Self-pay | Admitting: Cardiology

## 2017-03-21 ENCOUNTER — Other Ambulatory Visit: Payer: Self-pay | Admitting: Family Medicine

## 2017-03-21 NOTE — Telephone Encounter (Signed)
Call in #[phone] with 5 rf 

## 2017-03-21 NOTE — Telephone Encounter (Signed)
Last refill 09/28/16 and last office visit 03/07/17.  Okay to fill?

## 2017-03-29 ENCOUNTER — Ambulatory Visit (INDEPENDENT_AMBULATORY_CARE_PROVIDER_SITE_OTHER): Payer: BC Managed Care – PPO | Admitting: Family Medicine

## 2017-03-29 ENCOUNTER — Encounter: Payer: Self-pay | Admitting: Family Medicine

## 2017-03-29 VITALS — BP 112/70 | Temp 98.5°F | Ht 68.0 in | Wt 156.0 lb

## 2017-03-29 DIAGNOSIS — Z0000 Encounter for general adult medical examination without abnormal findings: Secondary | ICD-10-CM

## 2017-03-29 DIAGNOSIS — G629 Polyneuropathy, unspecified: Secondary | ICD-10-CM

## 2017-03-29 DIAGNOSIS — N390 Urinary tract infection, site not specified: Secondary | ICD-10-CM

## 2017-03-29 LAB — POC URINALSYSI DIPSTICK (AUTOMATED)
BILIRUBIN UA: NEGATIVE
Glucose, UA: NEGATIVE
Ketones, UA: NEGATIVE
NITRITE UA: POSITIVE
PH UA: 6.5 (ref 5.0–8.0)
Protein, UA: NEGATIVE
Spec Grav, UA: 1.015 (ref 1.010–1.025)
UROBILINOGEN UA: 0.2 U/dL

## 2017-03-29 NOTE — Patient Instructions (Signed)
WE NOW OFFER   [facility] FAST TRACK!!!  SAME DAY Appointments for ACUTE CARE  Such as: Sprains, Injuries, cuts, abrasions, rashes, muscle pain, joint pain, back pain Colds, flu, sore throats, headache, allergies, cough, fever  Ear pain, sinus and eye infections Abdominal pain, nausea, vomiting, diarrhea, upset stomach Animal/insect bites  3 Easy Ways to Schedule: Walk-In Scheduling Call in scheduling Mychart Sign-up: [email or web address]         

## 2017-03-29 NOTE — Addendum Note (Signed)
Addended by: Alysia Penna A on: 03/29/2017 05:20 PM   Modules accepted: Orders

## 2017-03-29 NOTE — Progress Notes (Signed)
   Subjective:    Patient ID: [name], female    DOB: [birth year], 61 y.o.   MRN: [id number]  HPI Here for a well exam. She feels fine physically. She has been under some stress while helping her mother after she recently had a stroke. We met a few weeks ago about this and she was started on Lexapro. This has been very helpful for her and she is better able to deal with things.    Review of Systems  Constitutional: Negative.   HENT: Negative.   Eyes: Negative.   Respiratory: Negative.   Cardiovascular: Negative.   Gastrointestinal: Negative.   Genitourinary: Negative for decreased urine volume, difficulty urinating, dyspareunia, dysuria, enuresis, flank pain, frequency, hematuria, pelvic pain and urgency.  Musculoskeletal: Negative.   Skin: Negative.   Neurological: Negative.   Psychiatric/Behavioral: Negative.        Objective:   Physical Exam  Constitutional: She is oriented to person, place, and time. She appears well-developed and well-nourished. No distress.  HENT:  Head: Normocephalic and atraumatic.  Right Ear: External ear normal.  Left Ear: External ear normal.  Nose: Nose normal.  Mouth/Throat: Oropharynx is clear and moist. No oropharyngeal exudate.  Eyes: Conjunctivae and EOM are normal. Pupils are equal, round, and reactive to light. No scleral icterus.  Neck: Normal range of motion. Neck supple. No JVD present. No thyromegaly present.  Cardiovascular: Normal rate, regular rhythm, normal heart sounds and intact distal pulses. Exam reveals no gallop and no friction rub.  No murmur heard. Pulmonary/Chest: Effort normal and breath sounds normal. No respiratory distress. She has no wheezes. She has no rales. She exhibits no tenderness.  Abdominal: Soft. Bowel sounds are normal. She exhibits no distension and no mass. There is no tenderness. There is no rebound and no guarding.  Musculoskeletal: Normal range of motion. She exhibits no edema or tenderness.    Lymphadenopathy:    She has no cervical adenopathy.  Neurological: She is alert and oriented to person, place, and time. She has normal reflexes. No cranial nerve deficit. She exhibits normal muscle tone. Coordination normal.  Skin: Skin is warm and dry. No rash noted. No erythema.  Psychiatric: She has a normal mood and affect. Her behavior is normal. Judgment and thought content normal.          Assessment & Plan:  Well exam. We discussed diet and exercise. Get fasting labs soon. She will stay on Lexapro for the time being.  Alysia Penna, MD

## 2017-03-29 NOTE — Addendum Note (Signed)
Addended by: Tomi Likens on: 03/29/2017 05:14 PM   Modules accepted: Orders

## 2017-03-30 ENCOUNTER — Other Ambulatory Visit (INDEPENDENT_AMBULATORY_CARE_PROVIDER_SITE_OTHER): Payer: BC Managed Care – PPO

## 2017-03-30 DIAGNOSIS — G629 Polyneuropathy, unspecified: Secondary | ICD-10-CM | POA: Diagnosis not present

## 2017-03-30 DIAGNOSIS — Z0000 Encounter for general adult medical examination without abnormal findings: Secondary | ICD-10-CM

## 2017-03-30 LAB — CBC WITH DIFFERENTIAL/PLATELET
Basophils Absolute: 0 10*3/uL (ref 0.0–0.1)
Basophils Relative: 0.8 % (ref 0.0–3.0)
Eosinophils Absolute: 0.2 10*3/uL (ref 0.0–0.7)
Eosinophils Relative: 4.2 % (ref 0.0–5.0)
HCT: 34.7 % — ABNORMAL LOW (ref 36.0–46.0)
Hemoglobin: 11.7 g/dL — ABNORMAL LOW (ref 12.0–15.0)
LYMPHS ABS: 1 10*3/uL (ref 0.7–4.0)
Lymphocytes Relative: 26.5 % (ref 12.0–46.0)
MCHC: 33.8 g/dL (ref 30.0–36.0)
MCV: 88.5 fl (ref 78.0–100.0)
MONO ABS: 0.5 10*3/uL (ref 0.1–1.0)
Monocytes Relative: 12.2 % — ABNORMAL HIGH (ref 3.0–12.0)
NEUTROS PCT: 56.3 % (ref 43.0–77.0)
Neutro Abs: 2.2 10*3/uL (ref 1.4–7.7)
PLATELETS: 263 10*3/uL (ref 150.0–400.0)
RBC: 3.92 Mil/uL (ref 3.87–5.11)
RDW: 12.7 % (ref 11.5–15.5)
WBC: 4 10*3/uL (ref 4.0–10.5)

## 2017-03-30 LAB — VITAMIN B12: Vitamin B-12: 181 pg/mL — ABNORMAL LOW (ref 211–911)

## 2017-03-30 LAB — BASIC METABOLIC PANEL
BUN: 12 mg/dL (ref 6–23)
CHLORIDE: 106 meq/L (ref 96–112)
CO2: 27 meq/L (ref 19–32)
Calcium: 8.9 mg/dL (ref 8.4–10.5)
Creatinine, Ser: 0.78 mg/dL (ref 0.40–1.20)
GFR: 79.91 mL/min (ref >60.00)
Glucose, Bld: 95 mg/dL (ref 70–99)
POTASSIUM: 3.4 meq/L — AB (ref 3.5–5.1)
SODIUM: 140 meq/L (ref 135–145)

## 2017-03-30 LAB — LIPID PANEL
CHOL/HDL RATIO: 2
Cholesterol: 89 mg/dL (ref 0–200)
HDL: 35.6 mg/dL — AB (ref >39.00)
LDL CALC: 42 mg/dL (ref 0–99)
NonHDL: 53.1
TRIGLYCERIDES: 58 mg/dL (ref 0.0–149.0)
VLDL: 11.6 mg/dL (ref 0.0–40.0)

## 2017-03-30 LAB — HEPATIC FUNCTION PANEL
ALBUMIN: 3.6 g/dL (ref 3.5–5.2)
ALT: 11 U/L (ref 0–35)
AST: 15 U/L (ref 0–37)
Alkaline Phosphatase: 61 U/L (ref 39–117)
Bilirubin, Direct: 0.1 mg/dL (ref 0.0–0.3)
Total Bilirubin: 0.4 mg/dL (ref 0.2–1.2)
Total Protein: 7.3 g/dL (ref 6.0–8.3)

## 2017-03-30 LAB — TSH: TSH: 0.79 u[IU]/mL (ref 0.35–4.50)

## 2017-04-01 LAB — URINE CULTURE
MICRO NUMBER:: 81252681
SPECIMEN QUALITY: ADEQUATE

## 2017-04-03 ENCOUNTER — Ambulatory Visit (INDEPENDENT_AMBULATORY_CARE_PROVIDER_SITE_OTHER): Payer: BC Managed Care – PPO

## 2017-04-03 DIAGNOSIS — E538 Deficiency of other specified B group vitamins: Secondary | ICD-10-CM | POA: Diagnosis not present

## 2017-04-03 MED ORDER — CYANOCOBALAMIN 1000 MCG/ML IJ SOLN
1000.0000 ug | Freq: Once | INTRAMUSCULAR | Status: AC
  Administered 2017-04-03: 1000 ug via INTRAMUSCULAR

## 2017-04-04 MED ORDER — CIPROFLOXACIN HCL 500 MG PO TABS: 500.0000 mg | ORAL_TABLET | Freq: Two times a day (BID) | ORAL | 0 refills | Status: DC

## 2017-04-04 NOTE — Addendum Note (Signed)
Addended by: Myriam Forehand on: 04/04/2017 03:12 PM   Modules accepted: Orders

## 2017-04-10 ENCOUNTER — Ambulatory Visit (INDEPENDENT_AMBULATORY_CARE_PROVIDER_SITE_OTHER): Payer: BC Managed Care – PPO

## 2017-04-10 DIAGNOSIS — E538 Deficiency of other specified B group vitamins: Secondary | ICD-10-CM | POA: Diagnosis not present

## 2017-04-10 MED ORDER — CYANOCOBALAMIN 1000 MCG/ML IJ SOLN
1000.0000 ug | Freq: Once | INTRAMUSCULAR | Status: AC
  Administered 2017-04-10: 1000 ug via INTRAMUSCULAR

## 2017-04-17 ENCOUNTER — Ambulatory Visit (INDEPENDENT_AMBULATORY_CARE_PROVIDER_SITE_OTHER): Payer: BC Managed Care – PPO | Admitting: Family Medicine

## 2017-04-17 DIAGNOSIS — E538 Deficiency of other specified B group vitamins: Secondary | ICD-10-CM | POA: Diagnosis not present

## 2017-04-17 MED ORDER — CYANOCOBALAMIN 1000 MCG/ML IJ SOLN
1000.0000 ug | Freq: Once | INTRAMUSCULAR | Status: AC
  Administered 2017-04-17: 1000 ug via INTRAMUSCULAR

## 2017-04-24 ENCOUNTER — Ambulatory Visit (INDEPENDENT_AMBULATORY_CARE_PROVIDER_SITE_OTHER): Payer: BC Managed Care – PPO | Admitting: *Deleted

## 2017-04-24 DIAGNOSIS — E538 Deficiency of other specified B group vitamins: Secondary | ICD-10-CM

## 2017-04-24 MED ORDER — CYANOCOBALAMIN 1000 MCG/ML IJ SOLN
1000.0000 ug | Freq: Once | INTRAMUSCULAR | Status: AC
  Administered 2017-04-24: 1000 ug via INTRAMUSCULAR

## 2017-04-24 NOTE — Progress Notes (Signed)
Per orders of Dr. Sarajane Jews, injection of B12 given by Dorrene German. Patient tolerated injection well.  Dorrene German, RN

## 2017-04-25 ENCOUNTER — Telehealth: Payer: Self-pay | Admitting: *Deleted

## 2017-04-25 NOTE — Telephone Encounter (Signed)
Should be every week for 12 weeks

## 2017-04-25 NOTE — Telephone Encounter (Signed)
Pt came in for B12 injection yesterday.  Per result notes: Start on B12 shots 1000 mcg weekly. Call in a 12 week supply and recheck a level in 12 weeks.  Do you want her to continue doing weekly injection x12 weeks, or did you want her to start monthly injections after first 4 weekly injections? Thanks.

## 2017-04-26 NOTE — Telephone Encounter (Signed)
Patient returned call and agent: Malachy Mood notified her of message from provider.

## 2017-04-26 NOTE — Telephone Encounter (Signed)
Called pt and left a VM advised to call back CRM created.

## 2017-05-01 ENCOUNTER — Ambulatory Visit: Payer: BC Managed Care – PPO

## 2017-05-04 ENCOUNTER — Encounter: Payer: Self-pay | Admitting: Family Medicine

## 2017-05-04 ENCOUNTER — Ambulatory Visit (INDEPENDENT_AMBULATORY_CARE_PROVIDER_SITE_OTHER): Payer: BC Managed Care – PPO | Admitting: Family Medicine

## 2017-05-04 DIAGNOSIS — E538 Deficiency of other specified B group vitamins: Secondary | ICD-10-CM | POA: Diagnosis not present

## 2017-05-04 MED ORDER — CYANOCOBALAMIN 1000 MCG/ML IJ SOLN
1000.0000 ug | Freq: Once | INTRAMUSCULAR | Status: AC
  Administered 2017-05-04: 1000 ug via INTRAMUSCULAR

## 2017-05-05 NOTE — Progress Notes (Signed)
   Subjective:    Patient ID: [name], female    DOB: April 07, 1956, 61 y.o.   MRN: [id number]  HPI Shot given    Review of Systems     Objective:   Physical Exam        Assessment & Plan:

## 2017-05-11 ENCOUNTER — Ambulatory Visit (INDEPENDENT_AMBULATORY_CARE_PROVIDER_SITE_OTHER): Payer: BC Managed Care – PPO

## 2017-05-11 DIAGNOSIS — E538 Deficiency of other specified B group vitamins: Secondary | ICD-10-CM | POA: Diagnosis not present

## 2017-05-11 MED ORDER — CYANOCOBALAMIN 1000 MCG/ML IJ SOLN
1000.0000 ug | Freq: Once | INTRAMUSCULAR | Status: AC
  Administered 2017-05-11: 1000 ug via INTRAMUSCULAR

## 2017-05-18 ENCOUNTER — Ambulatory Visit (INDEPENDENT_AMBULATORY_CARE_PROVIDER_SITE_OTHER): Payer: BC Managed Care – PPO

## 2017-05-18 DIAGNOSIS — E538 Deficiency of other specified B group vitamins: Secondary | ICD-10-CM | POA: Diagnosis not present

## 2017-05-18 MED ORDER — CYANOCOBALAMIN 1000 MCG/ML IJ SOLN
1000.0000 ug | Freq: Once | INTRAMUSCULAR | Status: AC
  Administered 2017-05-18: 1000 ug via INTRAMUSCULAR

## 2017-05-25 ENCOUNTER — Ambulatory Visit (INDEPENDENT_AMBULATORY_CARE_PROVIDER_SITE_OTHER): Payer: BC Managed Care – PPO

## 2017-05-25 DIAGNOSIS — E538 Deficiency of other specified B group vitamins: Secondary | ICD-10-CM | POA: Diagnosis not present

## 2017-05-25 MED ORDER — CYANOCOBALAMIN 1000 MCG/ML IJ SOLN
1000.0000 ug | Freq: Once | INTRAMUSCULAR | Status: AC
  Administered 2017-05-25: 1000 ug via INTRAMUSCULAR

## 2017-05-25 NOTE — Patient Instructions (Signed)
Pt received her Vit B12 Injection this afternoon, pt tolerated the injection well .

## 2017-06-01 ENCOUNTER — Ambulatory Visit (INDEPENDENT_AMBULATORY_CARE_PROVIDER_SITE_OTHER): Payer: BC Managed Care – PPO

## 2017-06-01 DIAGNOSIS — E538 Deficiency of other specified B group vitamins: Secondary | ICD-10-CM

## 2017-06-01 MED ORDER — CYANOCOBALAMIN 1000 MCG/ML IJ SOLN
1000.0000 ug | Freq: Once | INTRAMUSCULAR | Status: AC
  Administered 2017-06-01: 1000 ug via INTRAMUSCULAR

## 2017-06-01 NOTE — Patient Instructions (Signed)
Pt got her Vit B12 injection this afternoon. Pt tolerated injection well.

## 2017-06-05 NOTE — Progress Notes (Signed)
I agree

## 2017-06-08 NOTE — Progress Notes (Signed)
Per orders of Dr. Sarajane Jews, injection of B12 given by Rosealee Albee, CMA. Patient tolerated injection well.

## 2017-06-09 ENCOUNTER — Ambulatory Visit (INDEPENDENT_AMBULATORY_CARE_PROVIDER_SITE_OTHER): Payer: BC Managed Care – PPO | Admitting: Family Medicine

## 2017-06-09 DIAGNOSIS — E538 Deficiency of other specified B group vitamins: Secondary | ICD-10-CM | POA: Diagnosis not present

## 2017-06-09 MED ORDER — CYANOCOBALAMIN 1000 MCG/ML IJ SOLN
1000.0000 ug | Freq: Once | INTRAMUSCULAR | Status: AC
  Administered 2017-06-09: 1000 ug via INTRAMUSCULAR

## 2017-06-12 ENCOUNTER — Other Ambulatory Visit: Payer: Self-pay | Admitting: Cardiology

## 2017-06-15 ENCOUNTER — Ambulatory Visit: Payer: BC Managed Care – PPO

## 2017-06-22 ENCOUNTER — Telehealth: Payer: Self-pay | Admitting: Family Medicine

## 2017-06-22 ENCOUNTER — Ambulatory Visit (INDEPENDENT_AMBULATORY_CARE_PROVIDER_SITE_OTHER): Payer: BC Managed Care – PPO | Admitting: Family Medicine

## 2017-06-22 DIAGNOSIS — E538 Deficiency of other specified B group vitamins: Secondary | ICD-10-CM

## 2017-06-22 MED ORDER — CYANOCOBALAMIN 1000 MCG/ML IJ SOLN
1000.0000 ug | Freq: Once | INTRAMUSCULAR | Status: AC
  Administered 2017-06-22: 1000 ug via INTRAMUSCULAR

## 2017-06-22 NOTE — Telephone Encounter (Signed)
How long should pt wait after last Vitamin B 12 injection before she repeat labs?

## 2017-06-23 NOTE — Telephone Encounter (Signed)
Sent to PCP to advise 

## 2017-06-26 NOTE — Telephone Encounter (Signed)
I would check a level about one week after getting the last shot

## 2017-06-26 NOTE — Telephone Encounter (Signed)
Called pt and left a detailed VM

## 2017-08-02 ENCOUNTER — Telehealth: Payer: Self-pay | Admitting: Family Medicine

## 2017-08-02 NOTE — Telephone Encounter (Signed)
Copied from [place] [id number]. Topic: General - Other >> Aug 02, 2017  9:48 AM Darl Householder, RMA wrote: Reason for CRM: patient is requesting a callback concerning lab work, I searched and there is no order for lab, please return pt call for scheduling

## 2017-08-02 NOTE — Telephone Encounter (Signed)
Called pt and left a VM to call back to discuss her concerns with lab results

## 2017-08-02 NOTE — Telephone Encounter (Signed)
Pt returning Shelby's call. Please call [phone] (pts work number)

## 2017-08-03 ENCOUNTER — Other Ambulatory Visit: Payer: Self-pay

## 2017-08-03 DIAGNOSIS — E538 Deficiency of other specified B group vitamins: Secondary | ICD-10-CM

## 2017-08-03 NOTE — Telephone Encounter (Signed)
Pt wanted to be scheduled to have her vitamin B 12 level tested. Pt has been scheduled orders are in.

## 2017-08-05 ENCOUNTER — Other Ambulatory Visit: Payer: Self-pay | Admitting: Cardiology

## 2017-08-07 NOTE — Telephone Encounter (Signed)
Rx has been sent to the pharmacy electronically. ° °

## 2017-08-10 ENCOUNTER — Ambulatory Visit: Payer: BC Managed Care – PPO

## 2017-08-10 ENCOUNTER — Other Ambulatory Visit (INDEPENDENT_AMBULATORY_CARE_PROVIDER_SITE_OTHER): Payer: BC Managed Care – PPO

## 2017-08-10 DIAGNOSIS — E538 Deficiency of other specified B group vitamins: Secondary | ICD-10-CM

## 2017-08-14 LAB — VITAMIN B1: Vitamin B1 (Thiamine): 13 nmol/L (ref 8–30)

## 2017-08-30 ENCOUNTER — Other Ambulatory Visit: Payer: Self-pay | Admitting: Cardiology

## 2017-08-30 NOTE — Telephone Encounter (Signed)
REFILL 

## 2017-09-04 ENCOUNTER — Telehealth: Payer: Self-pay | Admitting: Family Medicine

## 2017-09-04 ENCOUNTER — Other Ambulatory Visit: Payer: Self-pay | Admitting: Family Medicine

## 2017-09-04 DIAGNOSIS — Z1231 Encounter for screening mammogram for malignant neoplasm of breast: Secondary | ICD-10-CM

## 2017-09-04 NOTE — Telephone Encounter (Signed)
Called pt and left a VM that all lab results came back normal and they were sent to her Mychart account. Advised pt to call back if she had any further questions.

## 2017-09-04 NOTE — Telephone Encounter (Signed)
Copied from [place] [phone] [id number]. Topic: General - Other >> Sep 04, 2017 12:47 PM Conception Chancy, NT wrote: Reason for CRM: patient states she would like the results from blood work on 08/10/17. She states she has not heard from anyone. Please advise.

## 2017-09-08 ENCOUNTER — Other Ambulatory Visit: Payer: Self-pay | Admitting: Family Medicine

## 2017-09-12 NOTE — Telephone Encounter (Signed)
Pt checking on status of rx - please call [phone]

## 2017-09-12 NOTE — Telephone Encounter (Signed)
Last OV 05/04/2017   Last refilled 03/22/2017 disp 15 with 5 refills   Sent to PCP for approval

## 2017-09-13 NOTE — Telephone Encounter (Signed)
Pt. Said she is out and needing it   Please let her know

## 2017-09-13 NOTE — Telephone Encounter (Signed)
Called pt and left a detailed VM that the provider is out of the office for the remainder of the week but will return on Monday. Advised pt to call back if she doesn't have enough to last her until then so we can see if another provider is willing to refill while PCP is out.

## 2017-09-13 NOTE — Telephone Encounter (Signed)
Please call her on her work number until 5 then her cell

## 2017-09-13 NOTE — Telephone Encounter (Signed)
Refill once OK. 

## 2017-09-13 NOTE — Telephone Encounter (Signed)
Sent to Dr. Elease Hashimoto to see if willing to refill with PCP being out of the office thanks

## 2017-09-14 NOTE — Telephone Encounter (Signed)
Called and spoke with pt. Pt advised and voiced understanding.  

## 2017-09-18 NOTE — Telephone Encounter (Signed)
Call in #[phone] with 5 rf 

## 2017-09-20 ENCOUNTER — Ambulatory Visit
Admission: RE | Admit: 2017-09-20 | Discharge: 2017-09-20 | Disposition: A | Discharge status: Home / Self Care | Payer: BC Managed Care – PPO | Source: Ambulatory Visit | Attending: Family Medicine | Admitting: Family Medicine

## 2017-09-20 DIAGNOSIS — Z1231 Encounter for screening mammogram for malignant neoplasm of breast: Secondary | ICD-10-CM

## 2017-10-06 ENCOUNTER — Other Ambulatory Visit: Payer: Self-pay | Admitting: Family Medicine

## 2017-10-06 ENCOUNTER — Other Ambulatory Visit: Payer: Self-pay | Admitting: Cardiology

## 2017-10-06 NOTE — Telephone Encounter (Signed)
Copied from [place] [id number]. Topic: Quick Communication - Rx Refill/Question >> Oct 06, 2017  3:58 PM Percell Belt A wrote: Medication: NIFEdipine (PROCARDIA-XL/ADALAT-CC/NIFEDICAL-XL) 30 MG 24 hr tablet [[id number]]  Has the patient contacted their pharmacy? no (Agent: If no, request that the patient contact the pharmacy for the refill.) (Agent: If yes, when and what did the pharmacy advise?)  [facility]with phone number [facility] [email or web address]   Agent: Please be advised that RX refills may take up to 3 business days. We ask that you follow-up with your pharmacy.

## 2017-10-08 ENCOUNTER — Other Ambulatory Visit: Payer: Self-pay | Admitting: Cardiology

## 2017-10-09 ENCOUNTER — Other Ambulatory Visit: Payer: Self-pay | Admitting: Family Medicine

## 2017-10-09 ENCOUNTER — Other Ambulatory Visit: Payer: Self-pay | Admitting: Cardiology

## 2017-10-09 ENCOUNTER — Telehealth: Payer: Self-pay | Admitting: Family Medicine

## 2017-10-09 NOTE — Telephone Encounter (Signed)
Copied from [place] [id number]. Topic: Quick Communication - Rx Refill/Question >> Oct 09, 2017  2:15 PM Boyd Kerbs wrote: Medication: NIFEdipine (PROCARDIA-XL/ADALAT-CC/NIFEDICAL-XL) 30 MG   Pt. Took last pill today.  Was told had to have appt today.  Last appt 03/29/17 ? Question if needs another appt.    Has the patient contacted their pharmacy? Yes.   (Agent: If no, request that the patient contact the pharmacy for the refill.) (Agent: If yes, when and what did the pharmacy advise?)  Preferred Pharmacy (with phone number or street name):   [id number] [place], [place] [address] [place] [address] Phone: [phone] Fax: [phone]    Agent: Please be advised that RX refills may take up to 3 business days. We ask that you follow-up with your pharmacy.

## 2017-10-09 NOTE — Telephone Encounter (Signed)
Ambien 10 mg and Procardia-XL refill requests  LOV 03/29/17 with Dr. Sarajane Jews  Last refill  For Ambien:  09/14/17  #15   0 refills  Last refill:  Procardia 08/30/17  #30   0 refills  [id number] [place] [place] - [address]

## 2017-10-10 NOTE — Telephone Encounter (Signed)
refill 

## 2017-10-10 NOTE — Telephone Encounter (Signed)
Medication filled on 10/10/17

## 2017-10-10 NOTE — Telephone Encounter (Signed)
Call in #[phone] with 5 rf 

## 2017-11-15 ENCOUNTER — Ambulatory Visit: Payer: BC Managed Care – PPO | Admitting: Family Medicine

## 2017-11-15 ENCOUNTER — Encounter: Payer: Self-pay | Admitting: Family Medicine

## 2017-11-15 VITALS — BP 100/70 | Temp 98.6°F | Wt 157.0 lb

## 2017-11-15 DIAGNOSIS — M2550 Pain in unspecified joint: Secondary | ICD-10-CM | POA: Diagnosis not present

## 2017-11-15 NOTE — Progress Notes (Signed)
   Subjective:    Patient ID: [name], female    DOB: [birth year], 62 y.o.   MRN: [id number]  HPI Here for the sudden onset this morning of stiffness and pain in a number of joints, particularly the hands, wrists, elbows, shoulders, knees and ankles. No joint swelling. No fever or headache. No rashes or jaundice. No recent travel. No recent medication changes. No recent tick bites to her knowledge. Her mother has a lot of arthritis, but she is not sure what type. Ibuprofen helps. She went to work today. The pains eased up around lunchtime.    Review of Systems  Respiratory: Negative.   Cardiovascular: Negative.   Gastrointestinal: Negative.   Genitourinary: Negative.   Musculoskeletal: Positive for arthralgias. Negative for back pain, joint swelling and myalgias.  Skin: Negative for rash.  Neurological: Negative.        Objective:   Physical Exam  Constitutional: She is oriented to person, place, and time. She appears well-developed and well-nourished. No distress.  Eyes: No scleral icterus.  Neck: No thyromegaly present.  Cardiovascular: Normal rate, regular rhythm, normal heart sounds and intact distal pulses.  Pulmonary/Chest: Effort normal and breath sounds normal. No stridor. No respiratory distress. She has no wheezes. She has no rales. She exhibits no tenderness.  Abdominal: Soft. Bowel sounds are normal. She exhibits no distension and no mass. There is no tenderness. There is no rebound and no guarding. No hernia.  No HSM   Musculoskeletal: Normal range of motion. She exhibits no edema, tenderness or deformity.  Lymphadenopathy:    She has no cervical adenopathy.  Neurological: She is alert and oriented to person, place, and time.  Skin: No rash noted. No erythema.  No ticks are found           Assessment & Plan:  Sudden onset of stiffness and pain in multiple joints without swelling. Possible etiologies include an acute viral syndrome, early hepatitis, tick  borne illnesses, and an inflammatory arthritis like rheumatoid. We will screen with labs today to check all these possibilities. She can take Ibuprofen 800 mg as needed. She will let us know if anything changes like the onset of headaches or fever or rashes.  Alysia Penna, MD

## 2017-11-16 LAB — CBC WITH DIFFERENTIAL/PLATELET
BASOS PCT: 0.6 % (ref 0.0–3.0)
Basophils Absolute: 0.1 10*3/uL (ref 0.0–0.1)
EOS PCT: 1.2 % (ref 0.0–5.0)
Eosinophils Absolute: 0.1 10*3/uL (ref 0.0–0.7)
HCT: 35.2 % — ABNORMAL LOW (ref 36.0–46.0)
HEMOGLOBIN: 11.9 g/dL — AB (ref 12.0–15.0)
LYMPHS ABS: 1.5 10*3/uL (ref 0.7–4.0)
Lymphocytes Relative: 15.7 % (ref 12.0–46.0)
MCHC: 33.9 g/dL (ref 30.0–36.0)
MCV: 88 fl (ref 78.0–100.0)
MONO ABS: 0.6 10*3/uL (ref 0.1–1.0)
Monocytes Relative: 6 % (ref 3.0–12.0)
NEUTROS PCT: 76.5 % (ref 43.0–77.0)
Neutro Abs: 7.2 10*3/uL (ref 1.4–7.7)
Platelets: 261 10*3/uL (ref 150.0–400.0)
RBC: 4 Mil/uL (ref 3.87–5.11)
RDW: 12.9 % (ref 11.5–15.5)
WBC: 9.5 10*3/uL (ref 4.0–10.5)

## 2017-11-16 LAB — BASIC METABOLIC PANEL
BUN: 14 mg/dL (ref 6–23)
CHLORIDE: 102 meq/L (ref 96–112)
CO2: 26 meq/L (ref 19–32)
CREATININE: 0.92 mg/dL (ref 0.40–1.20)
Calcium: 9.3 mg/dL (ref 8.4–10.5)
GFR: 65.91 mL/min (ref >60.00)
GLUCOSE: 108 mg/dL — AB (ref 70–99)
Potassium: 4.1 mEq/L (ref 3.5–5.1)
Sodium: 135 mEq/L (ref 135–145)

## 2017-11-16 LAB — C-REACTIVE PROTEIN: CRP: 2.1 mg/dL (ref 0.5–20.0)

## 2017-11-16 LAB — HEPATIC FUNCTION PANEL
ALK PHOS: 66 U/L (ref 39–117)
ALT: 12 U/L (ref 0–35)
AST: 14 U/L (ref 0–37)
Albumin: 3.9 g/dL (ref 3.5–5.2)
BILIRUBIN DIRECT: 0.1 mg/dL (ref 0.0–0.3)
Total Bilirubin: 0.5 mg/dL (ref 0.2–1.2)
Total Protein: 8.7 g/dL — ABNORMAL HIGH (ref 6.0–8.3)

## 2017-11-16 LAB — RHEUMATOID FACTOR: Rhuematoid fact SerPl-aCnc: >1000 IU/mL — ABNORMAL HIGH (ref <14)

## 2017-11-16 LAB — B. BURGDORFI ANTIBODIES: B burgdorferi Ab IgG+IgM: <0.9 index

## 2017-11-16 LAB — TSH: TSH: 0.54 u[IU]/mL (ref 0.35–4.50)

## 2017-11-16 LAB — SEDIMENTATION RATE: SED RATE: 91 mm/h — AB (ref 0–30)

## 2017-11-22 ENCOUNTER — Telehealth: Payer: Self-pay | Admitting: Family Medicine

## 2017-11-22 NOTE — Telephone Encounter (Signed)
Please advise 

## 2017-11-22 NOTE — Telephone Encounter (Signed)
Pt. Called for lab results.  During call, asked if Dr. Sarajane Jews would prescribe medication for fever blister.  Stated she recently developed a fever blister on right side of lower lip, when she was at the beach.  Reported she gets them with some regularity.  Reported she usually uses Abreva, but is asking if there is something that would be more effective, when she feels one coming on.  Advised will send message to Dr. Sarajane Jews.

## 2017-11-24 MED ORDER — VALACYCLOVIR HCL 500 MG PO TABS: ORAL_TABLET | ORAL | 2 refills | Status: DC

## 2017-11-24 NOTE — Telephone Encounter (Signed)
Call in Valtrex 500 mg to use BID for 5 days as needed for fever blisters, #60 with 2 rf

## 2017-11-24 NOTE — Addendum Note (Signed)
Addended by: Agnes Lawrence on: 11/24/2017 10:52 AM   Modules accepted: Orders

## 2017-11-24 NOTE — Telephone Encounter (Signed)
Rx done. 

## 2018-02-28 ENCOUNTER — Other Ambulatory Visit: Payer: Self-pay | Admitting: Family Medicine

## 2018-04-10 ENCOUNTER — Encounter: Payer: Self-pay | Admitting: Family Medicine

## 2018-04-10 ENCOUNTER — Ambulatory Visit (INDEPENDENT_AMBULATORY_CARE_PROVIDER_SITE_OTHER): Payer: BC Managed Care – PPO | Admitting: Family Medicine

## 2018-04-10 VITALS — BP 118/70 | HR 71 | Temp 98.0°F | Ht 67.0 in | Wt 164.0 lb

## 2018-04-10 DIAGNOSIS — Z0000 Encounter for general adult medical examination without abnormal findings: Secondary | ICD-10-CM

## 2018-04-10 DIAGNOSIS — M0579 Rheumatoid arthritis with rheumatoid factor of multiple sites without organ or systems involvement: Secondary | ICD-10-CM

## 2018-04-10 LAB — VITAMIN B12: VITAMIN B 12: 273 pg/mL (ref 211–911)

## 2018-04-10 LAB — CBC WITH DIFFERENTIAL/PLATELET
BASOS PCT: 0.2 % (ref 0.0–3.0)
Basophils Absolute: 0 10*3/uL (ref 0.0–0.1)
EOS ABS: 0 10*3/uL (ref 0.0–0.7)
Eosinophils Relative: 0.4 % (ref 0.0–5.0)
HCT: 45 % (ref 36.0–46.0)
Hemoglobin: 15.8 g/dL — ABNORMAL HIGH (ref 12.0–15.0)
Lymphocytes Relative: 18.6 % (ref 12.0–46.0)
Lymphs Abs: 1.4 10*3/uL (ref 0.7–4.0)
MCHC: 35.1 g/dL (ref 30.0–36.0)
MCV: 89.7 fl (ref 78.0–100.0)
MONO ABS: 0.3 10*3/uL (ref 0.1–1.0)
Monocytes Relative: 3.6 % (ref 3.0–12.0)
NEUTROS ABS: 5.7 10*3/uL (ref 1.4–7.7)
Neutrophils Relative %: 77.2 % — ABNORMAL HIGH (ref 43.0–77.0)
PLATELETS: 227 10*3/uL (ref 150.0–400.0)
RBC: 5.02 Mil/uL (ref 3.87–5.11)
RDW: 12 % (ref 11.5–15.5)
WBC: 7.3 10*3/uL (ref 4.0–10.5)

## 2018-04-10 LAB — HEPATIC FUNCTION PANEL
ALBUMIN: 4.2 g/dL (ref 3.5–5.2)
ALK PHOS: 75 U/L (ref 39–117)
ALT: 19 U/L (ref 0–35)
AST: 19 U/L (ref 0–37)
Bilirubin, Direct: 0.2 mg/dL (ref 0.0–0.3)
TOTAL PROTEIN: 8.6 g/dL — AB (ref 6.0–8.3)
Total Bilirubin: 0.9 mg/dL (ref 0.2–1.2)

## 2018-04-10 LAB — LIPID PANEL
CHOLESTEROL: 118 mg/dL (ref 0–200)
HDL: 45.1 mg/dL (ref >39.00)
LDL Cholesterol: 58 mg/dL (ref 0–99)
NonHDL: 72.58
TRIGLYCERIDES: 71 mg/dL (ref 0.0–149.0)
Total CHOL/HDL Ratio: 3
VLDL: 14.2 mg/dL (ref 0.0–40.0)

## 2018-04-10 LAB — POC URINALSYSI DIPSTICK (AUTOMATED)
BILIRUBIN UA: NEGATIVE
Blood, UA: NEGATIVE
GLUCOSE UA: NEGATIVE
Ketones, UA: NEGATIVE
Protein, UA: POSITIVE — AB
SPEC GRAV UA: 1.025 (ref 1.010–1.025)
Urobilinogen, UA: 0.2 E.U./dL
pH, UA: 6 (ref 5.0–8.0)

## 2018-04-10 LAB — BASIC METABOLIC PANEL
BUN: 16 mg/dL (ref 6–23)
CALCIUM: 9.6 mg/dL (ref 8.4–10.5)
CO2: 24 meq/L (ref 19–32)
Chloride: 106 mEq/L (ref 96–112)
Creatinine, Ser: 0.94 mg/dL (ref 0.40–1.20)
GFR: 64.21 mL/min (ref >60.00)
GLUCOSE: 89 mg/dL (ref 70–99)
Potassium: 4.9 mEq/L (ref 3.5–5.1)
SODIUM: 139 meq/L (ref 135–145)

## 2018-04-10 LAB — TSH: TSH: 0.59 u[IU]/mL (ref 0.35–4.50)

## 2018-04-10 NOTE — Progress Notes (Signed)
   Subjective:    Patient ID: [name], female    DOB: 1956-05-12, 62 y.o.   MRN: [id number]  HPI Here for a well exam. She is doing well except for pain and stiffness in multiple joints. In June she had labs and tested positive for rheumatoid factor. She has been using OTC supplements like turmeric and Ibuprofen, but these have not been very helpful.    Review of Systems  Constitutional: Negative.   HENT: Negative.   Eyes: Negative.   Respiratory: Negative.   Cardiovascular: Negative.   Gastrointestinal: Negative.   Genitourinary: Negative for decreased urine volume, difficulty urinating, dyspareunia, dysuria, enuresis, flank pain, frequency, hematuria, pelvic pain and urgency.  Musculoskeletal: Positive for arthralgias.  Skin: Negative.   Neurological: Negative.   Psychiatric/Behavioral: Negative.        Objective:   Physical Exam  Constitutional: She is oriented to person, place, and time. She appears well-developed and well-nourished. No distress.  HENT:  Head: Normocephalic and atraumatic.  Right Ear: External ear normal.  Left Ear: External ear normal.  Nose: Nose normal.  Mouth/Throat: Oropharynx is clear and moist. No oropharyngeal exudate.  Eyes: Pupils are equal, round, and reactive to light. Conjunctivae and EOM are normal. No scleral icterus.  Neck: Normal range of motion. Neck supple. No JVD present. No thyromegaly present.  Cardiovascular: Normal rate, regular rhythm, normal heart sounds and intact distal pulses. Exam reveals no gallop and no friction rub.  No murmur heard. Pulmonary/Chest: Effort normal and breath sounds normal. No respiratory distress. She has no wheezes. She has no rales. She exhibits no tenderness.  Abdominal: Soft. Bowel sounds are normal. She exhibits no distension and no mass. There is no tenderness. There is no rebound and no guarding.  Musculoskeletal: Normal range of motion. She exhibits no edema or tenderness.  Lymphadenopathy:    She has no cervical adenopathy.  Neurological: She is alert and oriented to person, place, and time. She has normal reflexes. She displays normal reflexes. No cranial nerve deficit. She exhibits normal muscle tone. Coordination normal.  Skin: Skin is warm and dry. No rash noted. No erythema.  Psychiatric: She has a normal mood and affect. Her behavior is normal. Judgment and thought content normal.          Assessment & Plan:  Well exam. We discussed diet and exercise. Get fasting labs. Refer to Rheumatology for RA.  Alysia Penna, MD

## 2018-04-11 DIAGNOSIS — M0579 Rheumatoid arthritis with rheumatoid factor of multiple sites without organ or systems involvement: Secondary | ICD-10-CM | POA: Insufficient documentation

## 2018-04-13 ENCOUNTER — Encounter: Payer: Self-pay | Admitting: *Deleted

## 2018-04-13 ENCOUNTER — Other Ambulatory Visit: Payer: Self-pay | Admitting: Family Medicine

## 2018-04-13 MED ORDER — NITROFURANTOIN MONOHYD MACRO 100 MG PO CAPS: 100.0000 mg | ORAL_CAPSULE | Freq: Two times a day (BID) | ORAL | 0 refills | Status: DC

## 2018-04-17 NOTE — Telephone Encounter (Signed)
Copied from [place] [id number]. Topic: General - Other >> Apr 11, 2018  9:13 AM Yvette Rack wrote: Reason for CRM: Anderson Malta from [facility] [phone] calling stating that they received a referral on the pt but they didn't receive his labs from June 2019 they need all his labs on that day fax [phone]   lalbs have been faxed as requested.

## 2018-04-20 ENCOUNTER — Telehealth: Payer: Self-pay | Admitting: Family Medicine

## 2018-04-25 NOTE — Telephone Encounter (Signed)
Requested medication (s) are due for refill today: Yes  Requested medication (s) are on the active medication list: Yes  Last refill:  10/10/17  Future visit scheduled: No  Notes to clinic:  See request    Requested Prescriptions  Pending Prescriptions Disp Refills   zolpidem (AMBIEN) 10 MG tablet [Pharmacy Med Name: ZOLPIDEM TARTRATE 10 MG TABLET] 15 tablet     Sig: TAKE 1 TABLET BY MOUTH AT BEDTIME     Not Delegated - Psychiatry:  Anxiolytics/Hypnotics Failed - 04/25/2018  2:38 PM      Failed - This refill cannot be delegated      Failed - Urine Drug Screen completed in last 360 days.      Passed - Valid encounter within last 6 months    Recent Outpatient Visits          2 weeks ago Preventative health care   [facility], [place]   5 months ago Arthralgia, unspecified joint   [facility], Ishmael Holter, MD   11 months ago B12 deficiency   [facility], Ishmael Holter, MD   1 year ago Preventative health care   [facility], Ishmael Holter, MD   1 year ago Depression with anxiety   [facility], Ishmael Holter, MD

## 2018-04-25 NOTE — Telephone Encounter (Signed)
Patient is waiting on status of her Zolpidem medication 10mg  tablet that was requested 04/20/18

## 2018-04-26 NOTE — Telephone Encounter (Signed)
[place]

## 2018-04-26 NOTE — Telephone Encounter (Signed)
Pt states she called the pharmacy last Friday, and still has not heard anything. Pt states she is now completely out, and needs asap zolpidem (AMBIEN) 10 MG tablet  Pt was hoping to get today.  [id number] [place], [place] [phone] (Phone) [phone] (Fax)

## 2018-04-27 ENCOUNTER — Other Ambulatory Visit: Payer: Self-pay

## 2018-04-27 ENCOUNTER — Other Ambulatory Visit: Payer: Self-pay | Admitting: Family Medicine

## 2018-04-27 MED ORDER — ZOLPIDEM TARTRATE 10 MG PO TABS: 10.0000 mg | ORAL_TABLET | Freq: Every evening | ORAL | 5 refills | Status: DC | PRN

## 2018-05-02 NOTE — Telephone Encounter (Signed)
Dr. Fry please advise. Thanks  

## 2018-05-03 NOTE — Telephone Encounter (Signed)
I refilled these on 04-27-18

## 2018-09-03 ENCOUNTER — Other Ambulatory Visit: Payer: Self-pay | Admitting: Family Medicine

## 2018-11-06 ENCOUNTER — Other Ambulatory Visit: Payer: Self-pay | Admitting: Family Medicine

## 2018-11-08 NOTE — Telephone Encounter (Signed)
Patient checking on the status of zolpidem (AMBIEN) 10 MG tablet request. Informed patient please allow 72 hour turn around.

## 2018-11-08 NOTE — Telephone Encounter (Signed)
Dr. Fry please advise on refill. Thanks  

## 2018-12-05 ENCOUNTER — Other Ambulatory Visit: Payer: Self-pay | Admitting: Family Medicine

## 2018-12-05 ENCOUNTER — Telehealth: Payer: Self-pay | Admitting: Family Medicine

## 2018-12-05 ENCOUNTER — Other Ambulatory Visit: Payer: Self-pay

## 2018-12-05 ENCOUNTER — Ambulatory Visit (INDEPENDENT_AMBULATORY_CARE_PROVIDER_SITE_OTHER): Payer: BC Managed Care – PPO | Admitting: Family Medicine

## 2018-12-05 DIAGNOSIS — Z20822 Contact with and (suspected) exposure to covid-19: Secondary | ICD-10-CM

## 2018-12-05 DIAGNOSIS — R430 Anosmia: Secondary | ICD-10-CM

## 2018-12-05 DIAGNOSIS — R0981 Nasal congestion: Secondary | ICD-10-CM | POA: Diagnosis not present

## 2018-12-05 NOTE — Telephone Encounter (Signed)
-----   Message from Anibal Henderson, [place] sent at 12/05/2018  9:59 AM EDT ----- Regarding: COVID 19 Test

## 2018-12-05 NOTE — Progress Notes (Signed)
Patient ID: [name], female   DOB: 26-Jan-1956, 63 y.o.   MRN: [id number]  This visit type was conducted due to national recommendations for restrictions regarding the COVID-19 pandemic in an effort to limit this patient's exposure and mitigate transmission in our community.   Virtual Visit via Video Note  I connected with Tamela Oddi on 12/05/18 at  9:45 AM EDT by a video enabled telemedicine application and verified that I am speaking with the correct person using two identifiers.  Location patient: home Location provider:work or home office Persons participating in the virtual visit: patient, provider  I discussed the limitations of evaluation and management by telemedicine and the availability of in person appointments. The patient expressed understanding and agreed to proceed.   HPI: Patient called requested COVID screening.  She states about 1 week ago she was helping to clean out the garage and was blowing lots of dust.  She did have some nasal congestion which she attributed to allergies initially.  She has had some rare cough during the past week.  She has had increased loss of taste and smell which she realizes could be related to the nasal congestion.  However, 1 of her coworkers sister tested positive for corona virus.  Patient has not had no fever.  No sore throat.  No diarrhea.  No body aches.  Patient was basically taken out of work by her supervisor until tested.  Past Medical History:  Diagnosis Date  . Acute renal failure (ARF) ([place])   . Headache(784.0)   . Hypertension   . Insomnia   . Intracranial aneurysm    status post surgery   Past Surgical History:  Procedure Laterality Date  . ABDOMINAL HYSTERECTOMY    . CEREBRAL ANEURYSM REPAIR  1998   By Dr Sherwood Gambler  . COLONOSCOPY  10/01/2015   per Dr. Henrene Pastor, adenomatous polyps, repeat in 5 yrs   . OVARIAN CYST REMOVAL      reports that she has never smoked. She has never used smokeless tobacco. She reports  current alcohol use. She reports that she does not use drugs. family history includes Aneurysm in her father and maternal uncle; Anuerysm in her sister; Arthritis in her mother; Cholelithiasis in her daughter and mother; Hypertension in her mother; Scoliosis in her mother; Sudden death in her father. Allergies  Allergen Reactions  . Doxycycline Nausea Only      ROS: See pertinent positives and negatives per HPI.  Past Medical History:  Diagnosis Date  . Acute renal failure (ARF) ([place])   . Headache(784.0)   . Hypertension   . Insomnia   . Intracranial aneurysm    status post surgery    Past Surgical History:  Procedure Laterality Date  . ABDOMINAL HYSTERECTOMY    . CEREBRAL ANEURYSM REPAIR  1998   By Dr Sherwood Gambler  . COLONOSCOPY  10/01/2015   per Dr. Henrene Pastor, adenomatous polyps, repeat in 5 yrs   . OVARIAN CYST REMOVAL      Family History  Problem Relation Age of Onset  . Hypertension Mother   . Cholelithiasis Mother   . Scoliosis Mother   . Arthritis Mother   . Sudden death Father   . Aneurysm Father        brain  . Anuerysm Sister        brain  . Aneurysm Maternal Uncle   . Cholelithiasis Daughter   . Colon cancer Neg Hx   . Esophageal cancer Neg Hx   . Pancreatic cancer Neg  Hx   . Rectal cancer Neg Hx   . Stomach cancer Neg Hx   . Breast cancer Neg Hx     SOCIAL HX: Non-smoker.  Works at [employer]   Current Outpatient Medications:  .  NIFEdipine (PROCARDIA-XL/NIFEDICAL-XL) 30 MG 24 hr tablet, TAKE 1 TABLET BY MOUTH EVERY DAY, Disp: 90 tablet, Rfl: 1 .  nitrofurantoin, macrocrystal-monohydrate, (MACROBID) 100 MG capsule, Take 1 capsule (100 mg total) by mouth 2 (two) times daily., Disp: 14 capsule, Rfl: 0 .  UNABLE TO FIND, Med Name: Curamin ---3 tablets by mouth daily., Disp: , Rfl:  .  valACYclovir (VALTREX) 500 MG tablet, Take 1 tablet twice a day for 5 days as needed for fever blisters, Disp: 60 tablet, Rfl: 2 .  zolpidem (AMBIEN) 10 MG  tablet, TAKE 1 TABLET BY MOUTH AT BEDTIME, Disp: 15 tablet, Rfl: 5  EXAM:  VITALS per patient if applicable:  GENERAL: alert, oriented, appears well and in no acute distress  HEENT: atraumatic, conjunttiva clear, no obvious abnormalities on inspection of external nose and ears  NECK: normal movements of the head and neck  LUNGS: on inspection no signs of respiratory distress, breathing rate appears normal, no obvious gross SOB, gasping or wheezing  CV: no obvious cyanosis  MS: moves all visible extremities without noticeable abnormality  PSYCH/NEURO: pleasant and cooperative, no obvious depression or anxiety, speech and thought processing grossly intact  ASSESSMENT AND PLAN:  Discussed the following assessment and plan:  Patient presents with nasal congestion and loss of taste and smell-question allergic versus COVID symptoms  -Refer for COVID-19 screening -Stay quarantined in the meantime -Follow-up promptly for any shortness of breath or other concerns    I discussed the assessment and treatment plan with the patient. The patient was provided an opportunity to ask questions and all were answered. The patient agreed with the plan and demonstrated an understanding of the instructions.   The patient was advised to call back or seek an in-person evaluation if the symptoms worsen or if the condition fails to improve as anticipated.   Carolann Littler, MD

## 2018-12-05 NOTE — Telephone Encounter (Signed)
Contacted pt and she states the [facility] testing site is closest to her. She is aware that an appointment is not required and aware that site is open M-F and times they are testing. Pt is aware to remain in her car and to wear a mask. Pt understood and had no additional questions at this time. Nothing further is needed   Order was placed.

## 2018-12-11 LAB — NOVEL CORONAVIRUS, NAA: SARS-CoV-2, NAA: NOT DETECTED

## 2019-01-17 ENCOUNTER — Other Ambulatory Visit: Payer: Self-pay | Admitting: Family Medicine

## 2019-03-11 ENCOUNTER — Other Ambulatory Visit: Payer: Self-pay | Admitting: Family Medicine

## 2019-03-11 DIAGNOSIS — Z1231 Encounter for screening mammogram for malignant neoplasm of breast: Secondary | ICD-10-CM

## 2019-04-30 ENCOUNTER — Other Ambulatory Visit: Payer: Self-pay

## 2019-04-30 ENCOUNTER — Ambulatory Visit
Admission: RE | Admit: 2019-04-30 | Discharge: 2019-04-30 | Disposition: A | Discharge status: Home / Self Care | Payer: BC Managed Care – PPO | Source: Ambulatory Visit | Attending: Family Medicine | Admitting: Family Medicine

## 2019-04-30 DIAGNOSIS — Z1231 Encounter for screening mammogram for malignant neoplasm of breast: Secondary | ICD-10-CM

## 2019-05-01 ENCOUNTER — Other Ambulatory Visit: Payer: Self-pay | Admitting: Family Medicine

## 2019-05-01 DIAGNOSIS — R928 Other abnormal and inconclusive findings on diagnostic imaging of breast: Secondary | ICD-10-CM

## 2019-05-08 ENCOUNTER — Other Ambulatory Visit: Payer: BC Managed Care – PPO

## 2019-05-26 ENCOUNTER — Other Ambulatory Visit: Payer: Self-pay | Admitting: Family Medicine

## 2019-06-13 ENCOUNTER — Other Ambulatory Visit: Payer: Self-pay | Admitting: Family Medicine

## 2019-06-13 NOTE — Telephone Encounter (Signed)
Last filled 11/08/2018 Last OV 12/05/2018 ( with [facility])  Ok to fill?

## 2019-06-14 NOTE — Telephone Encounter (Signed)
This needs to go to primary provider

## 2019-07-12 ENCOUNTER — Ambulatory Visit: Payer: BC Managed Care – PPO

## 2019-07-26 ENCOUNTER — Other Ambulatory Visit: Payer: BC Managed Care – PPO

## 2019-08-05 ENCOUNTER — Ambulatory Visit
Admission: RE | Admit: 2019-08-05 | Discharge: 2019-08-05 | Disposition: A | Discharge status: Home / Self Care | Payer: BC Managed Care – PPO | Source: Ambulatory Visit | Attending: Family Medicine | Admitting: Family Medicine

## 2019-08-05 ENCOUNTER — Other Ambulatory Visit: Payer: Self-pay

## 2019-08-05 DIAGNOSIS — R928 Other abnormal and inconclusive findings on diagnostic imaging of breast: Secondary | ICD-10-CM

## 2019-08-27 ENCOUNTER — Other Ambulatory Visit: Payer: Self-pay | Admitting: Family Medicine

## 2019-12-16 ENCOUNTER — Encounter: Payer: Self-pay | Admitting: Family Medicine

## 2019-12-16 ENCOUNTER — Ambulatory Visit (INDEPENDENT_AMBULATORY_CARE_PROVIDER_SITE_OTHER): Payer: BC Managed Care – PPO | Admitting: Family Medicine

## 2019-12-16 ENCOUNTER — Other Ambulatory Visit: Payer: Self-pay

## 2019-12-16 VITALS — BP 118/60 | HR 73 | Temp 98.7°F | Ht 67.0 in | Wt 145.2 lb

## 2019-12-16 DIAGNOSIS — M3500 Sicca syndrome, unspecified: Secondary | ICD-10-CM | POA: Insufficient documentation

## 2019-12-16 DIAGNOSIS — Z0000 Encounter for general adult medical examination without abnormal findings: Secondary | ICD-10-CM | POA: Diagnosis not present

## 2019-12-16 DIAGNOSIS — Z8679 Personal history of other diseases of the circulatory system: Secondary | ICD-10-CM | POA: Diagnosis not present

## 2019-12-16 NOTE — Progress Notes (Signed)
   Subjective:    Patient ID: [name], female    DOB: [birth year], 64 y.o.   MRN: [id number]  HPI Here for a well exam. She feels well.    Review of Systems  Constitutional: Negative.   HENT: Negative.   Eyes: Negative.   Respiratory: Negative.   Cardiovascular: Negative.   Gastrointestinal: Negative.   Genitourinary: Negative for decreased urine volume, difficulty urinating, dyspareunia, dysuria, enuresis, flank pain, frequency, hematuria, pelvic pain and urgency.  Musculoskeletal: Negative.   Skin: Negative.   Neurological: Negative.   Psychiatric/Behavioral: Negative.        Objective:   Physical Exam Constitutional:      General: She is not in acute distress.    Appearance: She is well-developed.  HENT:     Head: Normocephalic and atraumatic.     Right Ear: External ear normal.     Left Ear: External ear normal.     Nose: Nose normal.     Mouth/Throat:     Pharynx: No oropharyngeal exudate.  Eyes:     General: No scleral icterus.    Conjunctiva/sclera: Conjunctivae normal.     Pupils: Pupils are equal, round, and reactive to light.  Neck:     Thyroid: No thyromegaly.     Vascular: No JVD.  Cardiovascular:     Rate and Rhythm: Normal rate and regular rhythm.     Heart sounds: Normal heart sounds. No murmur heard.  No friction rub. No gallop.   Pulmonary:     Effort: Pulmonary effort is normal. No respiratory distress.     Breath sounds: Normal breath sounds. No wheezing or rales.  Chest:     Chest wall: No tenderness.  Abdominal:     General: Bowel sounds are normal. There is no distension.     Palpations: Abdomen is soft. There is no mass.     Tenderness: There is no abdominal tenderness. There is no guarding or rebound.  Musculoskeletal:        General: No tenderness. Normal range of motion.     Cervical back: Normal range of motion and neck supple.  Lymphadenopathy:     Cervical: No cervical adenopathy.  Skin:    General: Skin is warm  and dry.     Findings: No erythema or rash.  Neurological:     Mental Status: She is alert and oriented to person, place, and time.     Cranial Nerves: No cranial nerve deficit.     Motor: No abnormal muscle tone.     Coordination: Coordination normal.     Deep Tendon Reflexes: Reflexes are normal and symmetric. Reflexes normal.  Psychiatric:        Behavior: Behavior normal.        Thought Content: Thought content normal.        Judgment: Judgment normal.           Assessment & Plan:  Well exam. We discussed diet and exercise. Get fasting labs. Alysia Penna, MD

## 2019-12-17 LAB — CBC WITH DIFFERENTIAL/PLATELET
Absolute Monocytes: 624 cells/uL (ref 200–950)
Basophils Absolute: 42 cells/uL (ref 0–200)
Basophils Relative: 0.8 %
Eosinophils Absolute: 172 cells/uL (ref 15–500)
Eosinophils Relative: 3.3 %
HCT: 37.1 % (ref 35.0–45.0)
Hemoglobin: 12.4 g/dL (ref 11.7–15.5)
Lymphs Abs: 1602 cells/uL (ref 850–3900)
MCH: 29.5 pg (ref 27.0–33.0)
MCHC: 33.4 g/dL (ref 32.0–36.0)
MCV: 88.1 fL (ref 80.0–100.0)
MPV: 9.9 fL (ref 7.5–12.5)
Monocytes Relative: 12 %
Neutro Abs: 2761 cells/uL (ref 1500–7800)
Neutrophils Relative %: 53.1 %
Platelets: 227 10*3/uL (ref 140–400)
RBC: 4.21 10*6/uL (ref 3.80–5.10)
RDW: 11.9 % (ref 11.0–15.0)
Total Lymphocyte: 30.8 %
WBC: 5.2 10*3/uL (ref 3.8–10.8)

## 2019-12-17 LAB — LIPID PANEL
Cholesterol: 115 mg/dL (ref <200)
HDL: 47 mg/dL — ABNORMAL LOW (ref >=50)
LDL Cholesterol (Calc): 54 mg/dL (calc)
Non-HDL Cholesterol (Calc): 68 mg/dL (calc) (ref <130)
Total CHOL/HDL Ratio: 2.4 (calc) (ref <5.0)
Triglycerides: 65 mg/dL (ref <150)

## 2019-12-17 LAB — HEPATIC FUNCTION PANEL
AG Ratio: 1 (calc) (ref 1.0–2.5)
ALT: 16 U/L (ref 6–29)
AST: 17 U/L (ref 10–35)
Albumin: 4 g/dL (ref 3.6–5.1)
Alkaline phosphatase (APISO): 74 U/L (ref 37–153)
Bilirubin, Direct: 0.3 mg/dL — ABNORMAL HIGH (ref 0.0–0.2)
Globulin: 4.1 g/dL (calc) — ABNORMAL HIGH (ref 1.9–3.7)
Indirect Bilirubin: 0.7 mg/dL (calc) (ref 0.2–1.2)
Total Bilirubin: 1 mg/dL (ref 0.2–1.2)
Total Protein: 8.1 g/dL (ref 6.1–8.1)

## 2019-12-17 LAB — TSH: TSH: 0.54 mIU/L (ref 0.40–4.50)

## 2019-12-17 LAB — BASIC METABOLIC PANEL
BUN: 17 mg/dL (ref 7–25)
CO2: 25 mmol/L (ref 20–32)
Calcium: 9.1 mg/dL (ref 8.6–10.4)
Chloride: 105 mmol/L (ref 98–110)
Creat: 0.97 mg/dL (ref 0.50–0.99)
Glucose, Bld: 90 mg/dL (ref 65–99)
Potassium: 3.5 mmol/L (ref 3.5–5.3)
Sodium: 137 mmol/L (ref 135–146)

## 2019-12-26 ENCOUNTER — Other Ambulatory Visit: Payer: Self-pay | Admitting: Family Medicine

## 2020-03-04 ENCOUNTER — Other Ambulatory Visit: Payer: Self-pay | Admitting: Family Medicine

## 2020-06-29 ENCOUNTER — Ambulatory Visit (INDEPENDENT_AMBULATORY_CARE_PROVIDER_SITE_OTHER): Payer: BC Managed Care – PPO

## 2020-06-29 ENCOUNTER — Encounter: Payer: Self-pay | Admitting: Family Medicine

## 2020-06-29 ENCOUNTER — Other Ambulatory Visit: Payer: Self-pay

## 2020-06-29 ENCOUNTER — Ambulatory Visit: Payer: BC Managed Care – PPO | Admitting: Family Medicine

## 2020-06-29 VITALS — BP 124/72 | HR 64 | Temp 98.0°F | Ht 67.0 in | Wt 140.4 lb

## 2020-06-29 DIAGNOSIS — G8929 Other chronic pain: Secondary | ICD-10-CM | POA: Diagnosis not present

## 2020-06-29 DIAGNOSIS — M25551 Pain in right hip: Secondary | ICD-10-CM | POA: Diagnosis not present

## 2020-06-29 MED ORDER — MELOXICAM 15 MG PO TABS: 15.0000 mg | ORAL_TABLET | Freq: Every day | ORAL | 5 refills | Status: DC

## 2020-06-29 NOTE — Progress Notes (Signed)
   Subjective:    Patient ID: [name], female    DOB: Oct 19, 1955, 65 y.o.   MRN: [id number]  HPI Here for 2 months of pain in the right hip. This is primarily in the anterior hip, though sometimes she has posterior pain. No radiation down the leg. No back pain. No hx of trauma. Tylenol and Ibuprofen both help for awhile.    Review of Systems  Constitutional: Negative.   Respiratory: Negative.   Cardiovascular: Negative.   Musculoskeletal: Positive for arthralgias.       Objective:   Physical Exam Constitutional:      Comments: Walks with a slight limp   Cardiovascular:     Rate and Rhythm: Normal rate and regular rhythm.     Pulses: Normal pulses.     Heart sounds: Normal heart sounds.  Pulmonary:     Effort: Pulmonary effort is normal.     Breath sounds: Normal breath sounds.  Musculoskeletal:     Comments: No tenderness in the lower back. The spine has full ROM. She is tender in the right groin area. She has a fair amount of pain in the anterior right hip with flexion and especially with internal/external rotation   Neurological:     Mental Status: She is alert.           Assessment & Plan:  Right hip pain, likely due to degenerative arthritis. She will try Meloxicam 15 mg daily. Get Xrays of the right hip today. Alysia Penna, MD

## 2020-08-20 ENCOUNTER — Other Ambulatory Visit: Payer: Self-pay | Admitting: Family Medicine

## 2020-08-20 NOTE — Telephone Encounter (Signed)
Last office visit- 06/29/2020 Last refill- 12/27/2019--30 tabs with 5 refill  No future appointment scheduled

## 2020-08-28 ENCOUNTER — Other Ambulatory Visit: Payer: Self-pay | Admitting: Family Medicine

## 2020-09-30 ENCOUNTER — Other Ambulatory Visit: Payer: Self-pay | Admitting: Family Medicine

## 2020-10-06 ENCOUNTER — Encounter: Payer: Self-pay | Admitting: Internal Medicine

## 2021-03-07 ENCOUNTER — Other Ambulatory Visit: Payer: Self-pay | Admitting: Family Medicine

## 2021-03-09 NOTE — Telephone Encounter (Signed)
Pt LOV was 06/29/2020 Last refill done on 08/21/2020 Pt has a CPE appointment on 03/16/2021 Please advise

## 2021-03-16 ENCOUNTER — Encounter: Payer: Self-pay | Admitting: Family Medicine

## 2021-03-16 ENCOUNTER — Ambulatory Visit (INDEPENDENT_AMBULATORY_CARE_PROVIDER_SITE_OTHER): Payer: BC Managed Care – PPO | Admitting: Family Medicine

## 2021-03-16 ENCOUNTER — Other Ambulatory Visit: Payer: Self-pay

## 2021-03-16 ENCOUNTER — Other Ambulatory Visit: Payer: Self-pay | Admitting: Family Medicine

## 2021-03-16 VITALS — BP 114/64 | HR 76 | Temp 98.9°F | Ht 66.75 in | Wt 144.0 lb

## 2021-03-16 DIAGNOSIS — Z0000 Encounter for general adult medical examination without abnormal findings: Secondary | ICD-10-CM | POA: Diagnosis not present

## 2021-03-16 LAB — BASIC METABOLIC PANEL
BUN: 14 mg/dL (ref 6–23)
CO2: 23 mEq/L (ref 19–32)
Calcium: 9.1 mg/dL (ref 8.4–10.5)
Chloride: 103 mEq/L (ref 96–112)
Creatinine, Ser: 0.82 mg/dL (ref 0.40–1.20)
GFR: 75.52 mL/min (ref >60.00)
Glucose, Bld: 93 mg/dL (ref 70–99)
Potassium: 3.5 mEq/L (ref 3.5–5.1)
Sodium: 134 mEq/L — ABNORMAL LOW (ref 135–145)

## 2021-03-16 LAB — LIPID PANEL
Cholesterol: 104 mg/dL (ref 0–200)
HDL: 51.3 mg/dL (ref >39.00)
LDL Cholesterol: 44 mg/dL (ref 0–99)
NonHDL: 52.57
Total CHOL/HDL Ratio: 2
Triglycerides: 41 mg/dL (ref 0.0–149.0)
VLDL: 8.2 mg/dL (ref 0.0–40.0)

## 2021-03-16 LAB — CBC WITH DIFFERENTIAL/PLATELET
Basophils Absolute: 0 10*3/uL (ref 0.0–0.1)
Basophils Relative: 0.3 % (ref 0.0–3.0)
Eosinophils Absolute: 0.1 10*3/uL (ref 0.0–0.7)
Eosinophils Relative: 1.8 % (ref 0.0–5.0)
HCT: 35.5 % — ABNORMAL LOW (ref 36.0–46.0)
Hemoglobin: 11.6 g/dL — ABNORMAL LOW (ref 12.0–15.0)
Lymphocytes Relative: 12.9 % (ref 12.0–46.0)
Lymphs Abs: 1 10*3/uL (ref 0.7–4.0)
MCHC: 32.6 g/dL (ref 30.0–36.0)
MCV: 90.7 fl (ref 78.0–100.0)
Monocytes Absolute: 0.8 10*3/uL (ref 0.1–1.0)
Monocytes Relative: 10.5 % (ref 3.0–12.0)
Neutro Abs: 5.6 10*3/uL (ref 1.4–7.7)
Neutrophils Relative %: 74.5 % (ref 43.0–77.0)
Platelets: 201 10*3/uL (ref 150.0–400.0)
RBC: 3.91 Mil/uL (ref 3.87–5.11)
RDW: 13.1 % (ref 11.5–15.5)
WBC: 7.5 10*3/uL (ref 4.0–10.5)

## 2021-03-16 LAB — HEPATIC FUNCTION PANEL
ALT: 14 U/L (ref 0–35)
AST: 17 U/L (ref 0–37)
Albumin: 3.9 g/dL (ref 3.5–5.2)
Alkaline Phosphatase: 78 U/L (ref 39–117)
Bilirubin, Direct: 0.2 mg/dL (ref 0.0–0.3)
Total Bilirubin: 0.8 mg/dL (ref 0.2–1.2)
Total Protein: 8.1 g/dL (ref 6.0–8.3)

## 2021-03-16 LAB — HEMOGLOBIN A1C: Hgb A1c MFr Bld: 5.4 % (ref 4.6–6.5)

## 2021-03-16 LAB — TSH: TSH: 1.08 u[IU]/mL (ref 0.35–5.50)

## 2021-03-16 MED ORDER — TOBRAMYCIN-DEXAMETHASONE 0.3-0.1 % OP SUSP: 2.0000 [drp] | OPHTHALMIC | 0 refills | Status: DC

## 2021-03-16 MED ORDER — MELOXICAM 15 MG PO TABS: 15.0000 mg | ORAL_TABLET | Freq: Every day | ORAL | 3 refills | Status: DC

## 2021-03-16 MED ORDER — NIFEDIPINE ER 30 MG PO TB24: 30.0000 mg | ORAL_TABLET | Freq: Every day | ORAL | 3 refills | Status: DC

## 2021-03-16 NOTE — Progress Notes (Signed)
Subjective:    Patient ID: [name], female    DOB: 1955/11/25, 65 y.o.   MRN: [id number]  HPI Here for a well exam. She has been doing well in general. We started her on Meloxicam for her hip pain a few months ago, and this has been helpful. She does mention 2 days of irritation in both eyes and with DC in the eyes. She helps to take care of her 67 year old granddaughter, and the granddaughter recently had pinkeye. She is past due for a colonoscopy.    Review of Systems  Constitutional: Negative.   HENT: Negative.    Eyes:  Positive for discharge and redness. Negative for photophobia, pain and visual disturbance.  Respiratory: Negative.    Cardiovascular: Negative.   Gastrointestinal: Negative.   Genitourinary:  Negative for decreased urine volume, difficulty urinating, dyspareunia, dysuria, enuresis, flank pain, frequency, hematuria, pelvic pain and urgency.  Musculoskeletal: Negative.   Skin: Negative.   Neurological: Negative.  Negative for headaches.  Psychiatric/Behavioral: Negative.        Objective:   Physical Exam Constitutional:      General: She is not in acute distress.    Appearance: Normal appearance. She is well-developed.  HENT:     Head: Normocephalic and atraumatic.     Right Ear: External ear normal.     Left Ear: External ear normal.     Nose: Nose normal.     Mouth/Throat:     Pharynx: No oropharyngeal exudate.  Eyes:     General: No scleral icterus.       Right eye: Discharge present.        Left eye: Discharge present.    Pupils: Pupils are equal, round, and reactive to light.  Neck:     Thyroid: No thyromegaly.     Vascular: No JVD.  Cardiovascular:     Rate and Rhythm: Normal rate and regular rhythm.     Heart sounds: Normal heart sounds. No murmur heard.   No friction rub. No gallop.  Pulmonary:     Effort: Pulmonary effort is normal. No respiratory distress.     Breath sounds: Normal breath sounds. No wheezing or rales.   Chest:     Chest wall: No tenderness.  Abdominal:     General: Bowel sounds are normal. There is no distension.     Palpations: Abdomen is soft. There is no mass.     Tenderness: There is no abdominal tenderness. There is no guarding or rebound.  Musculoskeletal:        General: No tenderness. Normal range of motion.     Cervical back: Normal range of motion and neck supple.  Lymphadenopathy:     Cervical: No cervical adenopathy.  Skin:    General: Skin is warm and dry.     Findings: No erythema or rash.  Neurological:     Mental Status: She is alert and oriented to person, place, and time.     Cranial Nerves: No cranial nerve deficit.     Motor: No abnormal muscle tone.     Coordination: Coordination normal.     Deep Tendon Reflexes: Reflexes are normal and symmetric. Reflexes normal.  Psychiatric:        Behavior: Behavior normal.        Thought Content: Thought content normal.        Judgment: Judgment normal.          Assessment & Plan:  Well exam. We discussed diet  and exercise. Get fasting labs. I advised her to contact the GI office to set up another colonoscopy. She has pinkeye, and we will treat this with Tobradex drops. Alysia Penna, MD

## 2021-04-05 ENCOUNTER — Other Ambulatory Visit: Payer: Self-pay | Admitting: Family Medicine

## 2021-04-05 DIAGNOSIS — Z1231 Encounter for screening mammogram for malignant neoplasm of breast: Secondary | ICD-10-CM

## 2021-05-06 ENCOUNTER — Ambulatory Visit
Admission: RE | Admit: 2021-05-06 | Discharge: 2021-05-06 | Disposition: A | Discharge status: Home / Self Care | Payer: BC Managed Care – PPO | Source: Ambulatory Visit | Attending: Family Medicine | Admitting: Family Medicine

## 2021-05-06 ENCOUNTER — Other Ambulatory Visit: Payer: Self-pay

## 2021-05-06 DIAGNOSIS — Z1231 Encounter for screening mammogram for malignant neoplasm of breast: Secondary | ICD-10-CM

## 2021-05-12 ENCOUNTER — Telehealth: Payer: Self-pay | Admitting: Family Medicine

## 2021-05-12 DIAGNOSIS — M3500 Sicca syndrome, unspecified: Secondary | ICD-10-CM

## 2021-05-12 NOTE — Telephone Encounter (Signed)
Lvm informing patient referral has been placed.

## 2021-05-12 NOTE — Telephone Encounter (Signed)
Patient called because she wants to go back to see rheumatologist [facility]. Patient states that she called them to schedule for her sjogren's but it has been so long that she is considered a new patient. Patient states that she does not remember the name of the practice but it is on [address].      Please advise

## 2021-05-12 NOTE — Telephone Encounter (Signed)
Patient previously had referral placed to [facility] to Dr. Leigh Aurora.

## 2021-05-12 NOTE — Telephone Encounter (Signed)
I did the referral 

## 2021-06-10 ENCOUNTER — Encounter: Payer: Self-pay | Admitting: Family Medicine

## 2021-10-15 ENCOUNTER — Telehealth: Payer: Self-pay | Admitting: Family Medicine

## 2021-10-15 MED ORDER — ZOLPIDEM TARTRATE 10 MG PO TABS: ORAL_TABLET | ORAL | 5 refills | Status: DC

## 2021-10-15 NOTE — Telephone Encounter (Signed)
Pt notified via My Chart

## 2021-10-15 NOTE — Telephone Encounter (Signed)
Pt LOV was on 03/16/2022 Last refill was done on 03/10/2021 Please advise

## 2021-10-15 NOTE — Telephone Encounter (Signed)
Patient called because she has been in contact with her pharmacy, which is stating that they have been trying to send refill request for zolpidem (AMBIEN) 10 MG tablet  for the past couple days.      [facility]   [id number] [place]:  [phone] Fax:  [phone]      Please advise

## 2021-10-15 NOTE — Telephone Encounter (Signed)
Done

## 2022-02-22 ENCOUNTER — Other Ambulatory Visit: Payer: Self-pay | Admitting: Family Medicine

## 2022-03-18 ENCOUNTER — Other Ambulatory Visit: Payer: Self-pay | Admitting: Family Medicine

## 2022-03-23 ENCOUNTER — Other Ambulatory Visit: Payer: Self-pay | Admitting: Family Medicine

## 2022-03-28 ENCOUNTER — Other Ambulatory Visit: Payer: Self-pay | Admitting: Family Medicine

## 2022-03-28 DIAGNOSIS — Z1231 Encounter for screening mammogram for malignant neoplasm of breast: Secondary | ICD-10-CM

## 2022-04-21 ENCOUNTER — Other Ambulatory Visit: Payer: Self-pay | Admitting: Family Medicine

## 2022-04-22 NOTE — Telephone Encounter (Signed)
Last Ov-03/16/2021 Last refill- 10/15/21--30 tab, 5 refills  Next OV CPE-05/12/22

## 2022-05-11 ENCOUNTER — Other Ambulatory Visit: Payer: Self-pay | Admitting: Family Medicine

## 2022-05-12 ENCOUNTER — Ambulatory Visit (INDEPENDENT_AMBULATORY_CARE_PROVIDER_SITE_OTHER): Payer: BC Managed Care – PPO | Admitting: Family Medicine

## 2022-05-12 ENCOUNTER — Encounter: Payer: Self-pay | Admitting: Family Medicine

## 2022-05-12 VITALS — BP 122/68 | HR 71 | Temp 98.0°F | Ht 66.75 in | Wt 151.4 lb

## 2022-05-12 DIAGNOSIS — Z0000 Encounter for general adult medical examination without abnormal findings: Secondary | ICD-10-CM

## 2022-05-12 LAB — LIPID PANEL
Cholesterol: 106 mg/dL (ref 0–200)
HDL: 46.2 mg/dL (ref >39.00)
LDL Cholesterol: 48 mg/dL (ref 0–99)
NonHDL: 59.38
Total CHOL/HDL Ratio: 2
Triglycerides: 55 mg/dL (ref 0.0–149.0)
VLDL: 11 mg/dL (ref 0.0–40.0)

## 2022-05-12 LAB — HEPATIC FUNCTION PANEL
ALT: 11 U/L (ref 0–35)
AST: 15 U/L (ref 0–37)
Albumin: 3.9 g/dL (ref 3.5–5.2)
Alkaline Phosphatase: 71 U/L (ref 39–117)
Bilirubin, Direct: 0.1 mg/dL (ref 0.0–0.3)
Total Bilirubin: 0.4 mg/dL (ref 0.2–1.2)
Total Protein: 8 g/dL (ref 6.0–8.3)

## 2022-05-12 LAB — CBC WITH DIFFERENTIAL/PLATELET
Basophils Absolute: 0 10*3/uL (ref 0.0–0.1)
Basophils Relative: 0.8 % (ref 0.0–3.0)
Eosinophils Absolute: 0.3 10*3/uL (ref 0.0–0.7)
Eosinophils Relative: 5.5 % — ABNORMAL HIGH (ref 0.0–5.0)
HCT: 37.1 % (ref 36.0–46.0)
Hemoglobin: 12.3 g/dL (ref 12.0–15.0)
Lymphocytes Relative: 24.2 % (ref 12.0–46.0)
Lymphs Abs: 1.2 10*3/uL (ref 0.7–4.0)
MCHC: 33.2 g/dL (ref 30.0–36.0)
MCV: 88.6 fl (ref 78.0–100.0)
Monocytes Absolute: 0.6 10*3/uL (ref 0.1–1.0)
Monocytes Relative: 11.6 % (ref 3.0–12.0)
Neutro Abs: 3 10*3/uL (ref 1.4–7.7)
Neutrophils Relative %: 57.9 % (ref 43.0–77.0)
Platelets: 244 10*3/uL (ref 150.0–400.0)
RBC: 4.19 Mil/uL (ref 3.87–5.11)
RDW: 13.2 % (ref 11.5–15.5)
WBC: 5.1 10*3/uL (ref 4.0–10.5)

## 2022-05-12 LAB — BASIC METABOLIC PANEL
BUN: 21 mg/dL (ref 6–23)
CO2: 21 mEq/L (ref 19–32)
Calcium: 9.2 mg/dL (ref 8.4–10.5)
Chloride: 107 mEq/L (ref 96–112)
Creatinine, Ser: 0.79 mg/dL (ref 0.40–1.20)
GFR: 78.34 mL/min (ref >60.00)
Glucose, Bld: 87 mg/dL (ref 70–99)
Potassium: 3.7 mEq/L (ref 3.5–5.1)
Sodium: 138 mEq/L (ref 135–145)

## 2022-05-12 LAB — TSH: TSH: 0.93 u[IU]/mL (ref 0.35–5.50)

## 2022-05-12 LAB — HEMOGLOBIN A1C: Hgb A1c MFr Bld: 5.4 % (ref 4.6–6.5)

## 2022-05-12 MED ORDER — MELOXICAM 15 MG PO TABS: 15.0000 mg | ORAL_TABLET | Freq: Every day | ORAL | 3 refills | Status: DC

## 2022-05-12 MED ORDER — NIFEDIPINE ER 30 MG PO TB24: 30.0000 mg | ORAL_TABLET | Freq: Every day | ORAL | 3 refills | Status: DC

## 2022-05-12 NOTE — Progress Notes (Signed)
Subjective:    Patient ID: [name], female    DOB: 1956-04-07, 66 y.o.   MRN: [id number]  HPI Here for a well exam. She is doing well except for her eyes and her OA. 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 This is part of her Sjogren's syndrome. She wears contacts and applies Refresh drops to her eyes several times a day to keep them moist. At first she saw [facility], but now she sees Dr. Jola Schmidt at [facility] for this.    Review of Systems  Constitutional: Negative.   HENT: Negative.    Respiratory: Negative.    Cardiovascular: Negative.   Gastrointestinal: Negative.   Genitourinary:  Negative for decreased urine volume, difficulty urinating, dyspareunia, dysuria, enuresis, flank pain, frequency, hematuria, pelvic pain and urgency.  Musculoskeletal:  Positive for arthralgias.  Skin: Negative.   Neurological: Negative.  Negative for headaches.  Psychiatric/Behavioral: Negative.        Objective:   Physical Exam Constitutional:      General: She is not in acute distress.    Appearance: Normal appearance. She is well-developed.     Comments: Walks with a slight limp   HENT:     Head: Normocephalic and atraumatic.     Right Ear: External ear normal.     Left Ear: External ear normal.     Nose: Nose normal.     Mouth/Throat:     Pharynx: No oropharyngeal exudate.  Eyes:     General: No scleral icterus.    Conjunctiva/sclera: Conjunctivae normal.     Pupils: Pupils are equal, round, and reactive to light.  Neck:     Thyroid: No thyromegaly.     Vascular: No JVD.  Cardiovascular:     Rate and Rhythm: Normal rate and regular rhythm.     Heart sounds: Normal heart sounds. No murmur heard.    No friction rub. No gallop.  Pulmonary:     Effort: Pulmonary effort is normal. No respiratory distress.     Breath sounds: Normal breath sounds. No wheezing or rales.  Chest:     Chest wall: No  tenderness.  Abdominal:     General: Bowel sounds are normal. There is no distension.     Palpations: Abdomen is soft. There is no mass.     Tenderness: There is no abdominal tenderness. There is no guarding or rebound.  Musculoskeletal:        General: No tenderness. Normal range of motion.     Cervical back: Normal range of motion and neck supple.  Lymphadenopathy:     Cervical: No cervical adenopathy.  Skin:    General: Skin is warm and dry.     Findings: No erythema or rash.  Neurological:     Mental Status: She is alert and oriented to person, place, and time.     Cranial Nerves: No cranial nerve deficit.     Motor: No abnormal muscle tone.     Coordination: Coordination normal.     Deep Tendon Reflexes: Reflexes are normal and symmetric. Reflexes normal.  Psychiatric:        Mood and Affect: Mood normal.        Behavior: Behavior normal.        Thought Content: Thought content normal.        Judgment: Judgment normal.           Assessment & Plan:  Well  exam. We discussed diet and exercise. Get fasting labs. She is past due for a colonoscopy so we will have GI contact her.  Alysia Penna, MD

## 2022-05-26 ENCOUNTER — Ambulatory Visit
Admission: RE | Admit: 2022-05-26 | Discharge: 2022-05-26 | Disposition: A | Discharge status: Home / Self Care | Payer: BC Managed Care – PPO | Source: Ambulatory Visit | Attending: Family Medicine | Admitting: Family Medicine

## 2022-05-26 DIAGNOSIS — Z1231 Encounter for screening mammogram for malignant neoplasm of breast: Secondary | ICD-10-CM

## 2022-09-30 ENCOUNTER — Emergency Department (HOSPITAL_COMMUNITY): Payer: BC Managed Care – PPO

## 2022-09-30 ENCOUNTER — Observation Stay (HOSPITAL_COMMUNITY)
Admission: EM | Admit: 2022-09-30 | Discharge: 2022-10-01 | Disposition: A | Discharge status: Home / Self Care | Payer: BC Managed Care – PPO | Attending: Internal Medicine | Admitting: Internal Medicine

## 2022-09-30 ENCOUNTER — Other Ambulatory Visit: Payer: Self-pay

## 2022-09-30 ENCOUNTER — Observation Stay (HOSPITAL_BASED_OUTPATIENT_CLINIC_OR_DEPARTMENT_OTHER): Payer: BC Managed Care – PPO

## 2022-09-30 ENCOUNTER — Encounter (HOSPITAL_COMMUNITY): Payer: Self-pay | Admitting: Emergency Medicine

## 2022-09-30 DIAGNOSIS — E876 Hypokalemia: Secondary | ICD-10-CM | POA: Diagnosis not present

## 2022-09-30 DIAGNOSIS — G459 Transient cerebral ischemic attack, unspecified: Secondary | ICD-10-CM

## 2022-09-30 DIAGNOSIS — H538 Other visual disturbances: Secondary | ICD-10-CM | POA: Diagnosis present

## 2022-09-30 DIAGNOSIS — I10 Essential (primary) hypertension: Secondary | ICD-10-CM | POA: Diagnosis not present

## 2022-09-30 DIAGNOSIS — Z79899 Other long term (current) drug therapy: Secondary | ICD-10-CM | POA: Insufficient documentation

## 2022-09-30 DIAGNOSIS — Z7902 Long term (current) use of antithrombotics/antiplatelets: Secondary | ICD-10-CM | POA: Diagnosis not present

## 2022-09-30 DIAGNOSIS — Z7982 Long term (current) use of aspirin: Secondary | ICD-10-CM | POA: Insufficient documentation

## 2022-09-30 DIAGNOSIS — Z8679 Personal history of other diseases of the circulatory system: Secondary | ICD-10-CM

## 2022-09-30 DIAGNOSIS — H539 Unspecified visual disturbance: Secondary | ICD-10-CM

## 2022-09-30 DIAGNOSIS — I480 Paroxysmal atrial fibrillation: Secondary | ICD-10-CM | POA: Diagnosis not present

## 2022-09-30 LAB — COMPREHENSIVE METABOLIC PANEL
ALT: 18 U/L (ref 0–44)
AST: 20 U/L (ref 15–41)
Albumin: 3.7 g/dL (ref 3.5–5.0)
Alkaline Phosphatase: 75 U/L (ref 38–126)
Anion gap: 10 (ref 5–15)
BUN: 17 mg/dL (ref 8–23)
CO2: 21 mmol/L — ABNORMAL LOW (ref 22–32)
Calcium: 8.9 mg/dL (ref 8.9–10.3)
Chloride: 105 mmol/L (ref 98–111)
Creatinine, Ser: 1.15 mg/dL — ABNORMAL HIGH (ref 0.44–1.00)
GFR, Estimated: 53 mL/min — ABNORMAL LOW (ref >60)
Glucose, Bld: 126 mg/dL — ABNORMAL HIGH (ref 70–99)
Potassium: 3 mmol/L — ABNORMAL LOW (ref 3.5–5.1)
Sodium: 136 mmol/L (ref 135–145)
Total Bilirubin: 1.1 mg/dL (ref 0.3–1.2)
Total Protein: 8.8 g/dL — ABNORMAL HIGH (ref 6.5–8.1)

## 2022-09-30 LAB — CBC
HCT: 39.5 % (ref 36.0–46.0)
Hemoglobin: 12.7 g/dL (ref 12.0–15.0)
MCH: 29.2 pg (ref 26.0–34.0)
MCHC: 32.2 g/dL (ref 30.0–36.0)
MCV: 90.8 fL (ref 80.0–100.0)
Platelets: 228 10*3/uL (ref 150–400)
RBC: 4.35 MIL/uL (ref 3.87–5.11)
RDW: 12.6 % (ref 11.5–15.5)
WBC: 9.6 10*3/uL (ref 4.0–10.5)
nRBC: 0 % (ref 0.0–0.2)

## 2022-09-30 LAB — DIFFERENTIAL
Abs Immature Granulocytes: 0.02 10*3/uL (ref 0.00–0.07)
Basophils Absolute: 0 10*3/uL (ref 0.0–0.1)
Basophils Relative: 0 %
Eosinophils Absolute: 0 10*3/uL (ref 0.0–0.5)
Eosinophils Relative: 0 %
Immature Granulocytes: 0 %
Lymphocytes Relative: 13 %
Lymphs Abs: 1.2 10*3/uL (ref 0.7–4.0)
Monocytes Absolute: 0.5 10*3/uL (ref 0.1–1.0)
Monocytes Relative: 5 %
Neutro Abs: 7.8 10*3/uL — ABNORMAL HIGH (ref 1.7–7.7)
Neutrophils Relative %: 82 %

## 2022-09-30 LAB — MAGNESIUM: Magnesium: 2 mg/dL (ref 1.7–2.4)

## 2022-09-30 LAB — PROTIME-INR
INR: 1.2 (ref 0.8–1.2)
Prothrombin Time: 15.7 seconds — ABNORMAL HIGH (ref 11.4–15.2)

## 2022-09-30 LAB — I-STAT CHEM 8, ED
BUN: 19 mg/dL (ref 8–23)
Calcium, Ion: 1.08 mmol/L — ABNORMAL LOW (ref 1.15–1.40)
Chloride: 107 mmol/L (ref 98–111)
Creatinine, Ser: 1.1 mg/dL — ABNORMAL HIGH (ref 0.44–1.00)
Glucose, Bld: 126 mg/dL — ABNORMAL HIGH (ref 70–99)
HCT: 40 % (ref 36.0–46.0)
Hemoglobin: 13.6 g/dL (ref 12.0–15.0)
Potassium: 3.1 mmol/L — ABNORMAL LOW (ref 3.5–5.1)
Sodium: 138 mmol/L (ref 135–145)
TCO2: 19 mmol/L — ABNORMAL LOW (ref 22–32)

## 2022-09-30 LAB — ECHOCARDIOGRAM COMPLETE
Area-P 1/2: 4.15 cm2
Height: 67 in
S' Lateral: 3.1 cm
Weight: 2240 oz

## 2022-09-30 LAB — APTT: aPTT: 30 seconds (ref 24–36)

## 2022-09-30 LAB — CBG MONITORING, ED: Glucose-Capillary: 123 mg/dL — ABNORMAL HIGH (ref 70–99)

## 2022-09-30 LAB — ETHANOL: Alcohol, Ethyl (B): <10 mg/dL (ref <10)

## 2022-09-30 MED ORDER — SENNOSIDES-DOCUSATE SODIUM 8.6-50 MG PO TABS: 1.0000 | ORAL_TABLET | Freq: Every evening | ORAL | Status: DC | PRN

## 2022-09-30 MED ORDER — POTASSIUM CHLORIDE CRYS ER 20 MEQ PO TBCR
40.0000 meq | EXTENDED_RELEASE_TABLET | ORAL | Status: AC
  Administered 2022-09-30 (×2): 40 meq via ORAL
  Filled 2022-09-30 (×2): qty 2

## 2022-09-30 MED ORDER — IOHEXOL 350 MG/ML SOLN
60.0000 mL | Freq: Once | INTRAVENOUS | Status: AC | PRN
  Administered 2022-09-30: 60 mL via INTRAVENOUS

## 2022-09-30 MED ORDER — ASPIRIN 81 MG PO TBEC
81.0000 mg | DELAYED_RELEASE_TABLET | Freq: Every day | ORAL | Status: DC
  Administered 2022-10-01: 81 mg via ORAL
  Filled 2022-09-30: qty 1

## 2022-09-30 MED ORDER — CLOPIDOGREL BISULFATE 300 MG PO TABS
300.0000 mg | ORAL_TABLET | Freq: Once | ORAL | Status: AC
  Administered 2022-09-30: 300 mg via ORAL
  Filled 2022-09-30: qty 1

## 2022-09-30 MED ORDER — CLOPIDOGREL BISULFATE 75 MG PO TABS
75.0000 mg | ORAL_TABLET | Freq: Every day | ORAL | Status: DC
  Administered 2022-10-01: 75 mg via ORAL
  Filled 2022-09-30: qty 1

## 2022-09-30 MED ORDER — ACETAMINOPHEN 160 MG/5ML PO SOLN: 650.0000 mg | ORAL | Status: DC | PRN

## 2022-09-30 MED ORDER — POTASSIUM CHLORIDE CRYS ER 20 MEQ PO TBCR: 40.0000 meq | EXTENDED_RELEASE_TABLET | Freq: Once | ORAL | Status: DC

## 2022-09-30 MED ORDER — ACETAMINOPHEN 325 MG PO TABS
650.0000 mg | ORAL_TABLET | ORAL | Status: DC | PRN
  Administered 2022-09-30: 650 mg via ORAL
  Filled 2022-09-30: qty 2

## 2022-09-30 MED ORDER — ACETAMINOPHEN 650 MG RE SUPP: 650.0000 mg | RECTAL | Status: DC | PRN

## 2022-09-30 MED ORDER — ASPIRIN 325 MG PO TABS
325.0000 mg | ORAL_TABLET | Freq: Once | ORAL | Status: AC
  Administered 2022-09-30: 325 mg via ORAL
  Filled 2022-09-30: qty 1

## 2022-09-30 MED ORDER — ENOXAPARIN SODIUM 40 MG/0.4ML IJ SOSY
40.0000 mg | PREFILLED_SYRINGE | INTRAMUSCULAR | Status: DC
  Administered 2022-09-30 – 2022-10-01 (×2): 40 mg via SUBCUTANEOUS
  Filled 2022-09-30: qty 0.4

## 2022-09-30 MED ORDER — SODIUM CHLORIDE 0.9% FLUSH: 3.0000 mL | Freq: Once | INTRAVENOUS | Status: DC

## 2022-09-30 MED ORDER — STROKE: EARLY STAGES OF RECOVERY BOOK
Freq: Once | Status: AC
  Filled 2022-09-30: qty 1

## 2022-09-30 NOTE — H&P (Signed)
History and Physical    [name] BMW:[id number] DOB: 01-25-1956 DOA: 09/30/2022  PCP: Nelwyn Salisbury, MD   Patient coming from: Home  I have personally briefly reviewed patient's old medical records in [facility]  Chief Complaint: Blurred vision  HPI: [name] is a 67 y.o. female with medical history significant of hypertension, Sjogren's syndrome, cerebral aneurysm status post coiling in 1998, paroxysmal A-fib not on anticoagulation presented with blurred vision in the right eye.  Patient has been sick with likely viral infection for the last couple of days with a nonproductive cough.  Patient apparently was nauseous yesterday with improvement after taking Zofran.  Today she woke up and was feeling okay.  She apparently sat down at work to type on a keyboard when she noticed that she could not see out of her right visual field and that it was dark.  Her symptoms started around 7 AM this morning.  She did not have any headache, weakness or numbness, loss of consciousness or seizures.  She subsequently went to see her ophthalmologist who referred her to the ED after patient underwent pupil dilation and evaluation.  No fever, nausea, vomiting, chest pain, shortness of breath, diarrhea or dysuria noted.  ED Course: Code stroke was called.  Patient was evaluated by neurology.  Symptoms were improving in the ED.  CT head and CT angio head and neck were negative for acute intracranial pathology or emergent vascular finding. Hospitalist service was called to evaluate the patient.  Review of Systems: As per HPI otherwise all other systems were reviewed and are negative.   Past Medical History:  Diagnosis Date   Acute renal failure (ARF) (HCC)    Headache(784.0)    Hypertension    Insomnia    Intracranial aneurysm    status post surgery   Neurotrophic keratitis of both eyes    sees Dr. Sinda Du of [facility] Ophth   Sjogren's syndrome [facility] [place])    sees Dr. Alben Deeds      Past Surgical History:  Procedure Laterality Date   ABDOMINAL HYSTERECTOMY     CEREBRAL ANEURYSM REPAIR  1998   By Dr Newell Coral   COLONOSCOPY  10/01/2015   per Dr. Marina Goodell, adenomatous polyps, repeat in 5 yrs    OVARIAN CYST REMOVAL       reports that she has never smoked. She has never used smokeless tobacco. She reports current alcohol use. She reports that she does not use drugs.  Allergies  Allergen Reactions   Doxycycline Nausea Only    Family History  Problem Relation Age of Onset   Hypertension Mother    Cholelithiasis Mother    Scoliosis Mother    Arthritis Mother    Sudden death Father    Aneurysm Father        brain   Anuerysm Sister        brain   Aneurysm Maternal Uncle    Cholelithiasis Daughter    Colon cancer Neg Hx    Esophageal cancer Neg Hx    Pancreatic cancer Neg Hx    Rectal cancer Neg Hx    Stomach cancer Neg Hx    Breast cancer Neg Hx     Prior to Admission medications   Medication Sig Start Date End Date Taking? Authorizing Provider  meloxicam (MOBIC) 15 MG tablet Take 1 tablet (15 mg total) by mouth daily. 05/12/22   Nelwyn Salisbury, MD  NIFEdipine (ADALAT CC) 30 MG 24 hr tablet Take 1  tablet (30 mg total) by mouth daily. 05/12/22   Nelwyn Salisbury, MD  valACYclovir (VALTREX) 500 MG tablet Take 1 tablet twice a day for 5 days as needed for fever blisters 11/24/17   Nelwyn Salisbury, MD  zolpidem (AMBIEN) 10 MG tablet TAKE 1 TABLET BY MOUTH EVERYDAY AT BEDTIME 04/22/22   Nelwyn Salisbury, MD    Physical Exam: Vitals:   09/30/22 1059 09/30/22 1102  BP: (!) 121/97   Pulse: 98   Resp: 16   Temp: 98.2 F (36.8 C)   SpO2: 95%   Weight:  63.5 kg  Height:  5\' 7"  (1.702 m)    Constitutional: NAD, calm, comfortable Vitals:   09/30/22 1059 09/30/22 1102  BP: (!) 121/97   Pulse: 98   Resp: 16   Temp: 98.2 F (36.8 C)   SpO2: 95%   Weight:  63.5 kg  Height:  5\' 7"  (1.702 m)   Eyes: PERRL, lids and conjunctivae normal ENMT: Mucous membranes  are moist. Posterior pharynx clear of any exudate or lesions. Neck: normal, supple, no masses, no thyromegaly Respiratory: bilateral decreased breath sounds at bases, no wheezing, no crackles. Normal respiratory effort. No accessory muscle use.  Cardiovascular: S1 S2 positive, rate controlled. No extremity edema. 2+ pedal pulses.  Abdomen: no tenderness, no masses palpated. No hepatosplenomegaly. Bowel sounds positive.  Musculoskeletal: no clubbing / cyanosis. No joint deformity upper and lower extremities.  Skin: no rashes, lesions, ulcers. No induration Neurologic: CN 2-12 grossly intact. Moving extremities. No focal neurologic deficits.  Psychiatric: Normal judgment and insight. Alert and oriented x 3. Normal mood.    Labs on Admission: I have personally reviewed following labs and imaging studies  CBC: Recent Labs  Lab 09/30/22 1117 09/30/22 1127  WBC 9.6  --   NEUTROABS 7.8*  --   HGB 12.7 13.6  HCT 39.5 40.0  MCV 90.8  --   PLT 228  --    Basic Metabolic Panel: Recent Labs  Lab 09/30/22 1117 09/30/22 1127  NA 136 138  K 3.0* 3.1*  CL 105 107  CO2 21*  --   GLUCOSE 126* 126*  BUN 17 19  CREATININE 1.15* 1.10*  CALCIUM 8.9  --    GFR: Estimated Creatinine Clearance: 48.9 mL/min (A) (by C-G formula based on SCr of 1.1 mg/dL (H)). Liver Function Tests: Recent Labs  Lab 09/30/22 1117  AST 20  ALT 18  ALKPHOS 75  BILITOT 1.1  PROT 8.8*  ALBUMIN 3.7   No results for input(s): "LIPASE", "AMYLASE" in the last 168 hours. No results for input(s): "AMMONIA" in the last 168 hours. Coagulation Profile: Recent Labs  Lab 09/30/22 1117  INR 1.2   Cardiac Enzymes: No results for input(s): "CKTOTAL", "CKMB", "CKMBINDEX", "TROPONINI" in the last 168 hours. BNP (last 3 results) No results for input(s): "PROBNP" in the last 8760 hours. HbA1C: No results for input(s): "HGBA1C" in the last 72 hours. CBG: Recent Labs  Lab 09/30/22 1114  GLUCAP 123*   Lipid  Profile: No results for input(s): "CHOL", "HDL", "LDLCALC", "TRIG", "CHOLHDL", "LDLDIRECT" in the last 72 hours. Thyroid Function Tests: No results for input(s): "TSH", "T4TOTAL", "FREET4", "T3FREE", "THYROIDAB" in the last 72 hours. Anemia Panel: No results for input(s): "VITAMINB12", "FOLATE", "FERRITIN", "TIBC", "IRON", "RETICCTPCT" in the last 72 hours. Urine analysis:    Component Value Date/Time   COLORURINE AMBER (A) 06/03/2015 1819   APPEARANCEUR CLOUDY (A) 06/03/2015 1819   LABSPEC 1.022 06/03/2015 1819   PHURINE  5.5 06/03/2015 1819   GLUCOSEU NEGATIVE 06/03/2015 1819   GLUCOSEU NEGATIVE 08/27/2014 1002   HGBUR NEGATIVE 06/03/2015 1819   BILIRUBINUR NEGATIVE 04/10/2018 1443   KETONESUR 15 (A) 06/03/2015 1819   PROTEINUR Positive (A) 04/10/2018 1443   PROTEINUR 30 (A) 06/03/2015 1819   UROBILINOGEN 0.2 04/10/2018 1443   UROBILINOGEN 0.2 08/27/2014 1002   NITRITE POSTIVE 04/10/2018 1443   NITRITE NEGATIVE 06/03/2015 1819   LEUKOCYTESUR Large (3+) (A) 04/10/2018 1443    Radiological Exams on Admission: CT HEAD CODE STROKE WO CONTRAST  Result Date: 09/30/2022 CLINICAL DATA:  Code stroke. Right eye vision loss and right-sided weakness. EXAM: CT ANGIOGRAPHY HEAD AND NECK TECHNIQUE: Multidetector CT imaging of the head and neck was performed using the standard protocol during bolus administration of intravenous contrast. Multiplanar CT image reconstructions and MIPs were obtained to evaluate the vascular anatomy. Carotid stenosis measurements (when applicable) are obtained utilizing NASCET criteria, using the distal internal carotid diameter as the denominator. RADIATION DOSE REDUCTION: This exam was performed according to the departmental dose-optimization program which includes automated exposure control, adjustment of the mA and/or kV according to patient size and/or use of iterative reconstruction technique. CONTRAST:  60 cc Omnipaque 350 COMPARISON:  None Available. FINDINGS: CT  HEAD FINDINGS Brain: There is no acute intracranial hemorrhage, extra-axial fluid collection, or acute infarct Parenchymal volume is normal. The ventricles are normal in size. Gray-white differentiation is preserved. Postsurgical changes reflecting left frontotemporal craniotomy for aneurysm clipping are noted. The pituitary and suprasellar region are normal. There is no mass lesion. There is no mass effect or midline shift. Vascular: A left paraclinoid ICA aneurysm is noted. No hyperdense vessel is seen. Skull: Postsurgical changes as above. There is no acute calvarial fracture. Sinuses/Orbits: There is chronic right sphenoid sinusitis with near-complete opacification. The globes and orbits are unremarkable. Other: None. ASPECTS [facility] Stroke Program Early CT Score) - Ganglionic level infarction (caudate, lentiform nuclei, internal capsule, insula, M1-M3 cortex): 7 - Supraganglionic infarction (M4-M6 cortex): 3 Total score (0-10 with 10 being normal): 10 CTA NECK FINDINGS Aortic arch: The imaged aortic arch is normal. The origin of the left subclavian artery is patent. The origins of the brachiocephalic and left common carotid artery are largely excluded from the field of view. The subclavian arteries are patent to the level imaged. Right carotid system: The right common, internal, and external carotid arteries are patent, without hemodynamically significant stenosis or occlusion. There is no evidence of dissection or aneurysm. Left carotid system: The left common, internal, and external carotid arteries are patent, without hemodynamically significant stenosis or occlusion. There is no evidence of dissection or aneurysm. Vertebral arteries: The vertebral arteries are patent, without hemodynamically significant stenosis or occlusion there is no evidence of dissection or aneurysm. Skeleton: There is no acute osseous abnormality or suspicious osseous lesion. There is grade 1 anterolisthesis of C3 on C4 with reversal  of the normal cervical curvature centered at C5. 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 There is no visible canal hematoma. Other neck: The soft tissues of the neck are unremarkable. Upper chest: The imaged lung apices are clear. Review of the MIP images confirms the above findings CTA HEAD FINDINGS Anterior circulation: Streak artifact from the aneurysm clip partially obscures portions of the distal left intracranial ICA and proximal left MCA. Within this confine: The intracranial ICAs are patent with minimal calcified plaque on the left but no significant stenosis or occlusion. The bilateral MCAs  are patent, without proximal stenosis or occlusion. The bilateral ACAs are patent, without proximal stenosis or occlusion. The patient is status post clipping of the left paraclinoid aneurysm. There is no definite residual or recurrent aneurysm, though evaluation is degraded by streak artifact. There is no other aneurysm. There is no AVM. Posterior circulation: The bilateral V4 segments are patent. The basilar artery is patent. The major cerebellar arteries appear patent. The bilateral PCAs are patent, without proximal stenosis or occlusion. There is no aneurysm or AVM. Venous sinuses: Patent. Anatomic variants: None. Review of the MIP images confirms the above findings IMPRESSION: 1. No acute intracranial pathology. 2. No emergent vascular finding. Patent vasculature of the head and neck with no hemodynamically significant stenosis, occlusion, or dissection. 3. Status post clipping of the left paraclinoid aneurysm without definite residual aneurysm, though evaluation is degraded by streak artifact. 4. Chronic right sphenoid sinusitis. Findings communicated to Dr Iver Nestle at 11:28 am via AMION. Electronically Signed   By: Lesia Hausen M.D.   On: 09/30/2022 11:51   CT ANGIO HEAD NECK W WO CM (CODE STROKE)  Result Date: 09/30/2022 CLINICAL DATA:  Code  stroke. Right eye vision loss and right-sided weakness. EXAM: CT ANGIOGRAPHY HEAD AND NECK TECHNIQUE: Multidetector CT imaging of the head and neck was performed using the standard protocol during bolus administration of intravenous contrast. Multiplanar CT image reconstructions and MIPs were obtained to evaluate the vascular anatomy. Carotid stenosis measurements (when applicable) are obtained utilizing NASCET criteria, using the distal internal carotid diameter as the denominator. RADIATION DOSE REDUCTION: This exam was performed according to the departmental dose-optimization program which includes automated exposure control, adjustment of the mA and/or kV according to patient size and/or use of iterative reconstruction technique. CONTRAST:  60 cc Omnipaque 350 COMPARISON:  None Available. FINDINGS: CT HEAD FINDINGS Brain: There is no acute intracranial hemorrhage, extra-axial fluid collection, or acute infarct Parenchymal volume is normal. The ventricles are normal in size. Gray-white differentiation is preserved. Postsurgical changes reflecting left frontotemporal craniotomy for aneurysm clipping are noted. The pituitary and suprasellar region are normal. There is no mass lesion. There is no mass effect or midline shift. Vascular: A left paraclinoid ICA aneurysm is noted. No hyperdense vessel is seen. Skull: Postsurgical changes as above. There is no acute calvarial fracture. Sinuses/Orbits: There is chronic right sphenoid sinusitis with near-complete opacification. The globes and orbits are unremarkable. Other: None. ASPECTS [facility] Stroke Program Early CT Score) - Ganglionic level infarction (caudate, lentiform nuclei, internal capsule, insula, M1-M3 cortex): 7 - Supraganglionic infarction (M4-M6 cortex): 3 Total score (0-10 with 10 being normal): 10 CTA NECK FINDINGS Aortic arch: The imaged aortic arch is normal. The origin of the left subclavian artery is patent. The origins of the brachiocephalic and left  common carotid artery are largely excluded from the field of view. The subclavian arteries are patent to the level imaged. Right carotid system: The right common, internal, and external carotid arteries are patent, without hemodynamically significant stenosis or occlusion. There is no evidence of dissection or aneurysm. Left carotid system: The left common, internal, and external carotid arteries are patent, without hemodynamically significant stenosis or occlusion. There is no evidence of dissection or aneurysm. Vertebral arteries: The vertebral arteries are patent, without hemodynamically significant stenosis or occlusion there is no evidence of dissection or aneurysm. Skeleton: There is no acute osseous abnormality or suspicious osseous lesion. There is grade 1 anterolisthesis of C3 on C4 with reversal of the normal cervical curvature centered at C5. 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 There is no visible canal hematoma. Other neck: The soft tissues of the neck are unremarkable. Upper chest: The imaged lung apices are clear. Review of the MIP images confirms the above findings CTA HEAD FINDINGS Anterior circulation: Streak artifact from the aneurysm clip partially obscures portions of the distal left intracranial ICA and proximal left MCA. Within this confine: The intracranial ICAs are patent with minimal calcified plaque on the left but no significant stenosis or occlusion. The bilateral MCAs are patent, without proximal stenosis or occlusion. The bilateral ACAs are patent, without proximal stenosis or occlusion. The patient is status post clipping of the left paraclinoid aneurysm. There is no definite residual or recurrent aneurysm, though evaluation is degraded by streak artifact. There is no other aneurysm. There is no AVM. Posterior circulation: The bilateral V4 segments are patent. The basilar artery is patent. The major cerebellar  arteries appear patent. The bilateral PCAs are patent, without proximal stenosis or occlusion. There is no aneurysm or AVM. Venous sinuses: Patent. Anatomic variants: None. Review of the MIP images confirms the above findings IMPRESSION: 1. No acute intracranial pathology. 2. No emergent vascular finding. Patent vasculature of the head and neck with no hemodynamically significant stenosis, occlusion, or dissection. 3. Status post clipping of the left paraclinoid aneurysm without definite residual aneurysm, though evaluation is degraded by streak artifact. 4. Chronic right sphenoid sinusitis. Findings communicated to Dr Iver Nestle at 11:28 am via AMION. Electronically Signed   By: Lesia Hausen M.D.   On: 09/30/2022 11:51     Assessment/Plan  Possible TIA presenting with right-sided blurred vision -Presented with right-sided blurred vision since this morning.  Was evaluated by ophthalmology who did pupil dilation and evaluation and referred her to the ED. -Code stroke was called.  Patient was evaluated by neurology.  Symptoms were improving in the ED.  CT head and CT angio head and neck were negative for acute intracranial pathology or emergent vascular finding.  Patient cannot have MRI of brain because of history of aneurysm and coiling -Will need repeat CT of the head after 24 hours as per neurology recommendations -Continue aspirin and Plavix as per neurology recommendations.  Check lipid profile and hemoglobin A1c in AM. -2D echo.  Frequent neurochecks. -PT/OT/SLP evaluation. -Telemetry monitoring -Allow for permissive hypertension  Hypertension -Monitor blood pressure.  Nifedipine on hold  History of Sjogren's syndrome -Outpatient follow-up with rheumatology  Hypokalemia -Replace.  Repeat a.m. labs.   DVT prophylaxis: Lovenox Code Status: Full Family Communication: Daughter at bedside Disposition Plan: Home in 1 to 2 days once clinically improved and cleared by neurology Consults called:  Neurology by ED provider Admission status: Observation/telemetry  Severity of Illness: The appropriate patient status for this patient is OBSERVATION. Observation status is judged to be reasonable and necessary in order to provide the required intensity of service to ensure the patient's safety. The patient's presenting symptoms, physical exam findings, and initial radiographic and laboratory data in the context of their medical condition is felt to place them at decreased risk for further clinical deterioration. Furthermore, it is anticipated that the patient will be medically stable for discharge from the hospital within 2 midnights of admission.     Glade Lloyd MD [facility]  09/30/2022, 1:10 PM

## 2022-09-30 NOTE — Progress Notes (Signed)
  Echocardiogram 2D Echocardiogram has been performed.  Delcie Roch 09/30/2022, 4:52 PM

## 2022-09-30 NOTE — Consult Note (Signed)
Neurology Consultation Reason for Consult: Code Stroke  Requesting Physician: Dr. Theresia Lo  CC: Right visual field vision loss  History is obtained from: Patient, Chart review  HPI: [name] is a 67 y.o. female with a medical history significant for essential hypertension, Sjogren's syndrome followed by [facility], cerebral aneurysm s/p coiling in 1998, vitamin B12 deficiency, paroxysmal atrial fibrillation not on [facility] (patient previously believed to be in PAF due to pericarditis with acute illness following an insect bite without noted recurrence since onset in July of 2017), and right hip arthritis who presented to the ED on 09/30/2022 for evaluation of her right peripheral visual field loss.  Patient states that she was feeling nauseated yesterday with improvement after taking Zofran.  Today she woke up in her usual state of health and felt like she was able to go to work.  She states that she sat down at work to type on a keyboard when she noticed that she could not see out of her right visual field and notes that it was "dark".  She notes that it was approximately 0700 this morning when her symptoms started.  She subsequently went to her ophthalmologist that she sees who manages her dry eye and underwent pupil dilation and evaluation.  She was subsequently advised to come to the ER for further evaluation.  On arrival, patient states that her symptoms are much improved from onset.  LKW: 09/30/22 at 07:00 Thrombolytic given?: No, symptoms are felt to be too mild to treat on presentation. IA performed?: No, patient exam is not consistent with an LVO.  Vessel imaging complete without evidence of LVO. Premorbid modified rankin scale:      0 - No symptoms.  ROS: All other review of systems was negative except as noted in the HPI.   Past Medical History:  Diagnosis Date   Acute renal failure (ARF) (HCC)    Headache(784.0)    Hypertension    Insomnia    Intracranial aneurysm    status post  surgery   Neurotrophic keratitis of both eyes    sees Dr. Sinda Du of [facility]   Sjogren's syndrome [facility])    sees Dr. Alben Deeds    Past Surgical History:  Procedure Laterality Date   ABDOMINAL HYSTERECTOMY     CEREBRAL ANEURYSM REPAIR  1998   By Dr Newell Coral   COLONOSCOPY  10/01/2015   per Dr. Marina Goodell, adenomatous polyps, repeat in 5 yrs    OVARIAN CYST REMOVAL     Family History  Problem Relation Age of Onset   Hypertension Mother    Cholelithiasis Mother    Scoliosis Mother    Arthritis Mother    Sudden death Father    Aneurysm Father        brain   Anuerysm Sister        brain   Aneurysm Maternal Uncle    Cholelithiasis Daughter    Colon cancer Neg Hx    Esophageal cancer Neg Hx    Pancreatic cancer Neg Hx    Rectal cancer Neg Hx    Stomach cancer Neg Hx    Breast cancer Neg Hx    Social History:  reports that she has never smoked. She has never used smokeless tobacco. She reports current alcohol use. She reports that she does not use drugs.  Exam: Current vital signs: BP (!) 121/97   Pulse 98   Temp 98.2 F (36.8 C)   Resp 16   Ht 5\' 7"  (1.702 m)  Wt 63.5 kg   SpO2 95%   BMI 21.93 kg/m  Vital signs in last 24 hours: Temp:  [98.2 F (36.8 C)] 98.2 F (36.8 C) (05/10 1059) Pulse Rate:  [98] 98 (05/10 1059) Resp:  [16] 16 (05/10 1059) BP: (121)/(97) 121/97 (05/10 1059) SpO2:  [95 %] 95 % (05/10 1059) Weight:  [63.5 kg] 63.5 kg (05/10 1102)   Physical Exam  Constitutional: Appears well-developed and well-nourished.  Psych: Affect appropriate to situation, calm and cooperative with examination  Eyes: No scleral injection HENT: No oropharyngeal obstruction.  MSK: no joint deformities.  Cardiovascular: Normal rate and regular rhythm on cardiac monitor Respiratory: Effort normal, non-labored breathing, no respiratory distress GI: Soft.  No distension. There is no tenderness.  Skin: Warm dry and intact visible skin  Neuro: Mental  Status: Patient is awake, alert, oriented to person, place, month, year, and situation. Patient is able to give a clear and coherent history. No signs of aphasia or neglect Cranial Nerves: II: Initially, patient complains of minimal right upper quadrant visual field cut that improved on reexamination while in CT. Pupils are equal, dilated from recent ophthalmology examination this morning  III,IV, VI: EOMI without ptosis or diploplia.  V: Facial sensation is symmetric to light touch  VII: There is subtle delayed activation of the right mouth with smiling.  VIII: Hearing is intact to voice X: Palate elevates symmetrically XI: Shoulder shrug is symmetric. XII: Tongue is midline without atrophy or fasciculations.  Motor: Tone is normal. Bulk is normal.  Patient is able to elevate bilateral upper extremities antigravity without vertical drift Left lower extremity elevates antigravity without vertical drift Right lower extremity assessment is somewhat pain limited but she is able to elevate antigravity without vertical drift while wincing Sensory: Sensation is symmetric to light touch throughout Cerebellar: Initially there is very subtle ataxia with FNF on left upper extremity.  On repeat evaluation, there is no ataxia throughout. Gait:  Deferred in acute setting   NIHSS total 3 --> 1 Score breakdown @ 11:18: Initially 1 point for face, 1 point for ataxia, 1 point for right upper quadrant hemianopsia On reevaluation at 11:25, NIHSS improved to a 1 with ongoing right face subtle asymmetry  I have reviewed labs in epic and the results pertinent to this consultation are: CBC    Component Value Date/Time   WBC 5.1 05/12/2022 0923   RBC 4.19 05/12/2022 0923   HGB 13.6 09/30/2022 1127   HCT 40.0 09/30/2022 1127   PLT 244.0 05/12/2022 0923   MCV 88.6 05/12/2022 0923   MCH 29.5 12/16/2019 1032   MCHC 33.2 05/12/2022 0923   RDW 13.2 05/12/2022 0923   LYMPHSABS 1.2 05/12/2022 0923    MONOABS 0.6 05/12/2022 0923   EOSABS 0.3 05/12/2022 0923   BASOSABS 0.0 05/12/2022 0923   CMP     Component Value Date/Time   NA 138 09/30/2022 1127   K 3.1 (L) 09/30/2022 1127   CL 107 09/30/2022 1127   CO2 21 05/12/2022 0923   GLUCOSE 126 (H) 09/30/2022 1127   BUN 19 09/30/2022 1127   CREATININE 1.10 (H) 09/30/2022 1127   CREATININE 0.97 12/16/2019 1032   CALCIUM 9.2 05/12/2022 0923   PROT 8.0 05/12/2022 0923   ALBUMIN 3.9 05/12/2022 0923   AST 15 05/12/2022 0923   ALT 11 05/12/2022 0923   ALKPHOS 71 05/12/2022 0923   BILITOT 0.4 05/12/2022 0923   GFRNONAA >60 12/22/2015 0347   GFRAA >60 12/22/2015 0347   Lab Results  Component Value Date   CHOL 106 05/12/2022   HDL 46.20 05/12/2022   LDLCALC 48 05/12/2022   LDLDIRECT 63.0 12/02/2009   TRIG 55.0 05/12/2022   CHOLHDL 2 05/12/2022   Lab Results  Component Value Date   HGBA1C 5.4 05/12/2022   I have reviewed the images obtained:  CT head, CT angio head and neck wwo contrast 09/30/22: 1. No acute intracranial pathology. 2. No emergent vascular finding. Patent vasculature of the head and neck with no hemodynamically significant stenosis, occlusion, or dissection. 3. Status post clipping of the left paraclinoid aneurysm without definite residual aneurysm, though evaluation is degraded by streak artifact. 4. Chronic right sphenoid sinusitis.  Impression: 67 year old female with PMHx of HTN, cerebral aneurysm s/p clipping in 1998, Sjogren syndrome, vitamin B12 deficiency, and arthritis who presents to the ED with sudden onset of right hemianopsia at 07:00 this morning.  Initially patient evaluated by ophthalmologist who sent patient to the ED for further evaluation.  On presentation, patient symptoms had largely resolved with some minimal right upper quadrantanopsia and subtle right mouth asymmetry.  Patient's vision improved to baseline on evaluation in CT.  Patient was not offered TNK as her symptoms were felt to mild to  treat.  Patient is not a candidate for IR due to no LVO.  Presentation is felt to be consistent with a TIA versus small stroke.  Will complete stroke workup.  Recommendations:  # TIA or small stroke  - Stroke labs HgbA1c, fasting lipid panel - Unable to obtain MRI brain due to aneurysm clipping, will repeat CT head at 24 hours - CTA completed emergently during code stroke on neuro recommendation - Frequent neuro checks - Echocardiogram - Prophylactic therapy-Antiplatelet med: Aspirin - dose 325mg  PO or 300mg  PR, followed by 81 mg daily - Plavix 300 mg load with 75 mg daily for 21 day course - Risk factor modification - Telemetry monitoring; 30 day event monitor on discharge if no arrythmias captured  - Blood pressure goal   - Permissive hypertension to 220/120 due to possible acute stroke - PT consult, OT consult, Speech consult, unless patient is back to baseline - Stroke team to follow  Lanae Boast, AGACNP-BC [facility] Pager: [phone]  Attending Neurologist's note:  I personally saw this patient, gathering history, performing a full neurologic examination, reviewing relevant labs, personally reviewing relevant imaging including head CT, CTA head and neck, and formulated the assessment and plan, adding the note above for completeness and clarity to accurately reflect my thoughts  CRITICAL CARE Performed by: Gordy Councilman   Total critical care time: 45 minutes  Critical care time was exclusive of separately billable procedures and treating other patients.  Critical care was necessary to treat or prevent imminent or life-threatening deterioration.  Critical care was time spent personally by me on the following activities: development of treatment plan with patient and/or surrogate as well as nursing, discussions with consultants, evaluation of patient's response to treatment, examination of patient, obtaining history from patient or surrogate, ordering  and performing treatments and interventions, ordering and review of laboratory studies, ordering and review of radiographic studies, pulse oximetry and re-evaluation of patient's condition.

## 2022-09-30 NOTE — Code Documentation (Signed)
Stroke Response Nurse Documentation Code Documentation  [name] is a 67 y.o. female arriving to [facility]  via [employer] on 09/30/2022 with past medical hx of HTN, Sjogren's, cerebral aneurysm s/p coiling 1998, afib. On No antithrombotic. Code stroke was activated by ED.   Patient from work where she was LKW at 0700 and now complaining of right eye blindness, right sided facial droop, right sided weakness. Patient has been feeling nauseous and sick the past couple of days. When she arrived to work this morning she stated she sat down to log in to the computer and lost vision in her right eye. That it just went dark. Patient stated her symptoms had improved from onset.   Stroke team at the bedside on patient arrival. Labs drawn and patient cleared for CT by EDP. Patient to CT with team. NIHSS 3, see documentation for details and code stroke times. Patient with right hemianopia, right facial droop, and right limb ataxia on exam. The following imaging was completed:  CT Head and CTA. Patient is not a candidate for IV Thrombolytic due to symptoms too mild to treat. Patient is not not a candidate for IR due to no LVO noted on imaging.   Care Plan: VS/NIHSS q2hr x 12hr; then q4hr; BP Goal <220/120   Bedside handoff with ED RN Victorino Dike.    Felecia Jan  Stroke Response RN

## 2022-09-30 NOTE — Progress Notes (Signed)
Responded to page to support patient/Code Stroke.  Pt. Gone to CT.  Chaplain will follow as needed.  Venida Jarvis, [place], [facility], Pager [phone]

## 2022-09-30 NOTE — ED Triage Notes (Signed)
Pt c/o R side vision loss that started 0700 this am; endorses nausea yesterday, took Zofran with relief; states vision has improved but is not back to baseline; pt evaluated by opthalmology today, was referred to ED for further evaluation; also endorsing nonproductive cough x 3 days; denies unilateral weakness, R sided facial droop noted

## 2022-09-30 NOTE — ED Notes (Signed)
ED TO INPATIENT HANDOFF REPORT  ED Nurse Name and Phone #: Victorino Dike [phone]  S Name/Age/Gender [name] 67 y.o. female Room/Bed: 009C/009C  Code Status   Code Status: Full Code  Home/SNF/Other Home Patient oriented to: self, place, time, and situation Is this baseline? Yes   Triage Complete: Triage complete  Chief Complaint TIA (transient ischemic attack) [G45.9]  Triage Note Pt c/o R side vision loss that started 0700 this am; endorses nausea yesterday, took Zofran with relief; states vision has improved but is not back to baseline; pt evaluated by opthalmology today, was referred to ED for further evaluation; also endorsing nonproductive cough x 3 days; denies unilateral weakness, R sided facial droop noted   Allergies Allergies  Allergen Reactions   Doxycycline Nausea Only    Level of Care/Admitting Diagnosis ED Disposition     ED Disposition  Admit   Condition  --   Comment  Hospital Area: [facility] [[id number]]  Level of Care: Telemetry Medical [104]  May place patient in observation at [facility] or [facility] if equivalent level of care is available:: No  Covid Evaluation: Asymptomatic - no recent exposure (last 10 days) testing not required  Diagnosis: TIA (transient ischemic attack) [454098]  Admitting Physician: Glade Lloyd [[id number]]  Attending Physician: Glade Lloyd [[id number]]          B Medical/Surgery History Past Medical History:  Diagnosis Date   Acute renal failure (ARF) (HCC)    Headache(784.0)    Hypertension    Insomnia    Intracranial aneurysm    status post surgery   Neurotrophic keratitis of both eyes    sees Dr. Sinda Du of [facility] Op[facility]   Sjogren's syndrome [facility])    sees Dr. Alben Deeds    Past Surgical History:  Procedure Laterality Date   ABDOMINAL HYSTERECTOMY     CEREBRAL ANEURYSM REPAIR  1998   By Dr Newell Coral   COLONOSCOPY  10/01/2015   per Dr. Marina Goodell, adenomatous polyps,  repeat in 5 yrs    OVARIAN CYST REMOVAL       A IV Location/Drains/Wounds Patient Lines/Drains/Airways Status     Active Line/Drains/Airways     Name Placement date Placement time Site Days   Peripheral IV 12/20/15 Right Hand 12/20/15  2025  Hand  2476   Peripheral IV 12/20/15 Right Antecubital 12/20/15  2049  Antecubital  2476   Peripheral IV 09/30/22 18 G Left Antecubital 09/30/22  1117  Antecubital  less than 1   Wound / Incision (Open or Dehisced) 12/21/15 Other (Comment) Leg Left;Lower spider bites x 2 w/ pinpoint eschar in middle with erythema 12/21/15  0400  Leg  2475            Intake/Output Last 24 hours No intake or output data in the 24 hours ending 09/30/22 1508  Labs/Imaging Results for orders placed or performed during the hospital encounter of 09/30/22 (from the past 48 hour(s))  CBG monitoring, ED     Status: Abnormal   Collection Time: 09/30/22 11:14 AM  Result Value Ref Range   Glucose-Capillary 123 (H) 70 - 99 mg/dL    Comment: Glucose reference range applies only to samples taken after fasting for at least 8 hours.  Protime-INR     Status: Abnormal   Collection Time: 09/30/22 11:17 AM  Result Value Ref Range   Prothrombin Time 15.7 (H) 11.4 - 15.2 seconds   INR 1.2 0.8 - 1.2    Comment: (NOTE) INR goal  varies based on device and disease states. Performed at [facility], [address], [place], [place] [address]   APTT     Status: None   Collection Time: 09/30/22 11:17 AM  Result Value Ref Range   aPTT 30 24 - 36 seconds    Comment: Performed at [facility], [address], [place], [place] [address]  CBC     Status: None   Collection Time: 09/30/22 11:17 AM  Result Value Ref Range   WBC 9.6 4.0 - 10.5 K/uL   RBC 4.35 3.87 - 5.11 MIL/uL   Hemoglobin 12.7 12.0 - 15.0 g/dL   HCT 82.9 56.2 - 13.0 %   MCV 90.8 80.0 - 100.0 fL   MCH 29.2 26.0 - 34.0 pg   MCHC 32.2 30.0 - 36.0 g/dL   RDW 86.5 78.4 - 69.6 %   Platelets 228 150 - 400  K/uL   nRBC 0.0 0.0 - 0.2 %    Comment: Performed at [facility], [address], [place], [place] [address]  Differential     Status: Abnormal   Collection Time: 09/30/22 11:17 AM  Result Value Ref Range   Neutrophils Relative % 82 %   Neutro Abs 7.8 (H) 1.7 - 7.7 K/uL   Lymphocytes Relative 13 %   Lymphs Abs 1.2 0.7 - 4.0 K/uL   Monocytes Relative 5 %   Monocytes Absolute 0.5 0.1 - 1.0 K/uL   Eosinophils Relative 0 %   Eosinophils Absolute 0.0 0.0 - 0.5 K/uL   Basophils Relative 0 %   Basophils Absolute 0.0 0.0 - 0.1 K/uL   Immature Granulocytes 0 %   Abs Immature Granulocytes 0.02 0.00 - 0.07 K/uL    Comment: Performed at [facility], [address], [place], [place] [address]  Comprehensive metabolic panel     Status: Abnormal   Collection Time: 09/30/22 11:17 AM  Result Value Ref Range   Sodium 136 135 - 145 mmol/L   Potassium 3.0 (L) 3.5 - 5.1 mmol/L   Chloride 105 98 - 111 mmol/L   CO2 21 (L) 22 - 32 mmol/L   Glucose, Bld 126 (H) 70 - 99 mg/dL    Comment: Glucose reference range applies only to samples taken after fasting for at least 8 hours.   BUN 17 8 - 23 mg/dL   Creatinine, Ser 4.01 (H) 0.44 - 1.00 mg/dL   Calcium 8.9 8.9 - 02.7 mg/dL   Total Protein 8.8 (H) 6.5 - 8.1 g/dL   Albumin 3.7 3.5 - 5.0 g/dL   AST 20 15 - 41 U/L   ALT 18 0 - 44 U/L   Alkaline Phosphatase 75 38 - 126 U/L   Total Bilirubin 1.1 0.3 - 1.2 mg/dL   GFR, Estimated 53 (L) >60 mL/min    Comment: (NOTE) Calculated using the CKD-EPI Creatinine Equation (2021)    Anion gap 10 5 - 15    Comment: Performed at [facility], [address], [place], [place] [address]  Ethanol     Status: None   Collection Time: 09/30/22 11:17 AM  Result Value Ref Range   Alcohol, Ethyl (B) <10 <10 mg/dL    Comment: (NOTE) Lowest detectable limit for serum alcohol is 10 mg/dL.  For medical purposes only. Performed at [facility], [address] [place], [place] [address]   I-stat  chem 8, ED     Status: Abnormal   Collection Time:  09/30/22 11:27 AM  Result Value Ref Range   Sodium 138 135 - 145 mmol/L   Potassium 3.1 (L) 3.5 - 5.1 mmol/L   Chloride 107 98 - 111 mmol/L   BUN 19 8 - 23 mg/dL   Creatinine, Ser 1.61 (H) 0.44 - 1.00 mg/dL   Glucose, Bld 096 (H) 70 - 99 mg/dL    Comment: Glucose reference range applies only to samples taken after fasting for at least 8 hours.   Calcium, Ion 1.08 (L) 1.15 - 1.40 mmol/L   TCO2 19 (L) 22 - 32 mmol/L   Hemoglobin 13.6 12.0 - 15.0 g/dL   HCT 04.5 40.9 - 81.1 %   CT HEAD CODE STROKE WO CONTRAST  Result Date: 09/30/2022 CLINICAL DATA:  Code stroke. Right eye vision loss and right-sided weakness. EXAM: CT ANGIOGRAPHY HEAD AND NECK TECHNIQUE: Multidetector CT imaging of the head and neck was performed using the standard protocol during bolus administration of intravenous contrast. Multiplanar CT image reconstructions and MIPs were obtained to evaluate the vascular anatomy. Carotid stenosis measurements (when applicable) are obtained utilizing NASCET criteria, using the distal internal carotid diameter as the denominator. RADIATION DOSE REDUCTION: This exam was performed according to the departmental dose-optimization program which includes automated exposure control, adjustment of the mA and/or kV according to patient size and/or use of iterative reconstruction technique. CONTRAST:  60 cc Omnipaque 350 COMPARISON:  None Available. FINDINGS: CT HEAD FINDINGS Brain: There is no acute intracranial hemorrhage, extra-axial fluid collection, or acute infarct Parenchymal volume is normal. The ventricles are normal in size. Gray-white differentiation is preserved. Postsurgical changes reflecting left frontotemporal craniotomy for aneurysm clipping are noted. The pituitary and suprasellar region are normal. There is no mass lesion. There is no mass effect or midline shift. Vascular: A left paraclinoid ICA aneurysm is noted. No hyperdense vessel is  seen. Skull: Postsurgical changes as above. There is no acute calvarial fracture. Sinuses/Orbits: There is chronic right sphenoid sinusitis with near-complete opacification. The globes and orbits are unremarkable. Other: None. ASPECTS [facility] Stroke Program Early CT Score) - Ganglionic level infarction (caudate, lentiform nuclei, internal capsule, insula, M1-M3 cortex): 7 - Supraganglionic infarction (M4-M6 cortex): 3 Total score (0-10 with 10 being normal): 10 CTA NECK FINDINGS Aortic arch: The imaged aortic arch is normal. The origin of the left subclavian artery is patent. The origins of the brachiocephalic and left common carotid artery are largely excluded from the field of view. The subclavian arteries are patent to the level imaged. Right carotid system: The right common, internal, and external carotid arteries are patent, without hemodynamically significant stenosis or occlusion. There is no evidence of dissection or aneurysm. Left carotid system: The left common, internal, and external carotid arteries are patent, without hemodynamically significant stenosis or occlusion. There is no evidence of dissection or aneurysm. Vertebral arteries: The vertebral arteries are patent, without hemodynamically significant stenosis or occlusion there is no evidence of dissection or aneurysm. Skeleton: There is no acute osseous abnormality or suspicious osseous lesion. There is grade 1 anterolisthesis of C3 on C4 with reversal of the normal cervical curvature centered at C5. 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 There is no visible canal hematoma. Other neck: The soft tissues of the neck are unremarkable. Upper chest: The imaged lung apices are clear. Review of the MIP images confirms the above findings CTA HEAD FINDINGS Anterior circulation: Streak artifact from the aneurysm clip partially obscures portions of the distal left intracranial  ICA and  proximal left MCA. Within this confine: The intracranial ICAs are patent with minimal calcified plaque on the left but no significant stenosis or occlusion. The bilateral MCAs are patent, without proximal stenosis or occlusion. The bilateral ACAs are patent, without proximal stenosis or occlusion. The patient is status post clipping of the left paraclinoid aneurysm. There is no definite residual or recurrent aneurysm, though evaluation is degraded by streak artifact. There is no other aneurysm. There is no AVM. Posterior circulation: The bilateral V4 segments are patent. The basilar artery is patent. The major cerebellar arteries appear patent. The bilateral PCAs are patent, without proximal stenosis or occlusion. There is no aneurysm or AVM. Venous sinuses: Patent. Anatomic variants: None. Review of the MIP images confirms the above findings IMPRESSION: 1. No acute intracranial pathology. 2. No emergent vascular finding. Patent vasculature of the head and neck with no hemodynamically significant stenosis, occlusion, or dissection. 3. Status post clipping of the left paraclinoid aneurysm without definite residual aneurysm, though evaluation is degraded by streak artifact. 4. Chronic right sphenoid sinusitis. Findings communicated to Dr Iver Nestle at 11:28 am via AMION. Electronically Signed   By: Lesia Hausen M.D.   On: 09/30/2022 11:51   CT ANGIO HEAD NECK W WO CM (CODE STROKE)  Result Date: 09/30/2022 CLINICAL DATA:  Code stroke. Right eye vision loss and right-sided weakness. EXAM: CT ANGIOGRAPHY HEAD AND NECK TECHNIQUE: Multidetector CT imaging of the head and neck was performed using the standard protocol during bolus administration of intravenous contrast. Multiplanar CT image reconstructions and MIPs were obtained to evaluate the vascular anatomy. Carotid stenosis measurements (when applicable) are obtained utilizing NASCET criteria, using the distal internal carotid diameter as the denominator. RADIATION  DOSE REDUCTION: This exam was performed according to the departmental dose-optimization program which includes automated exposure control, adjustment of the mA and/or kV according to patient size and/or use of iterative reconstruction technique. CONTRAST:  60 cc Omnipaque 350 COMPARISON:  None Available. FINDINGS: CT HEAD FINDINGS Brain: There is no acute intracranial hemorrhage, extra-axial fluid collection, or acute infarct Parenchymal volume is normal. The ventricles are normal in size. Gray-white differentiation is preserved. Postsurgical changes reflecting left frontotemporal craniotomy for aneurysm clipping are noted. The pituitary and suprasellar region are normal. There is no mass lesion. There is no mass effect or midline shift. Vascular: A left paraclinoid ICA aneurysm is noted. No hyperdense vessel is seen. Skull: Postsurgical changes as above. There is no acute calvarial fracture. Sinuses/Orbits: There is chronic right sphenoid sinusitis with near-complete opacification. The globes and orbits are unremarkable. Other: None. ASPECTS [facility] Stroke Program Early CT Score) - Ganglionic level infarction (caudate, lentiform nuclei, internal capsule, insula, M1-M3 cortex): 7 - Supraganglionic infarction (M4-M6 cortex): 3 Total score (0-10 with 10 being normal): 10 CTA NECK FINDINGS Aortic arch: The imaged aortic arch is normal. The origin of the left subclavian artery is patent. The origins of the brachiocephalic and left common carotid artery are largely excluded from the field of view. The subclavian arteries are patent to the level imaged. Right carotid system: The right common, internal, and external carotid arteries are patent, without hemodynamically significant stenosis or occlusion. There is no evidence of dissection or aneurysm. Left carotid system: The left common, internal, and external carotid arteries are patent, without hemodynamically significant stenosis or occlusion. There is no evidence of  dissection or aneurysm. Vertebral arteries: The vertebral arteries are patent, without hemodynamically significant stenosis or occlusion there is no evidence of dissection or aneurysm. Skeleton:  There is no acute osseous abnormality or suspicious osseous lesion. There is grade 1 anterolisthesis of C3 on C4 with reversal of the normal cervical curvature centered at C5. 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 There is no visible canal hematoma. Other neck: The soft tissues of the neck are unremarkable. Upper chest: The imaged lung apices are clear. Review of the MIP images confirms the above findings CTA HEAD FINDINGS Anterior circulation: Streak artifact from the aneurysm clip partially obscures portions of the distal left intracranial ICA and proximal left MCA. Within this confine: The intracranial ICAs are patent with minimal calcified plaque on the left but no significant stenosis or occlusion. The bilateral MCAs are patent, without proximal stenosis or occlusion. The bilateral ACAs are patent, without proximal stenosis or occlusion. The patient is status post clipping of the left paraclinoid aneurysm. There is no definite residual or recurrent aneurysm, though evaluation is degraded by streak artifact. There is no other aneurysm. There is no AVM. Posterior circulation: The bilateral V4 segments are patent. The basilar artery is patent. The major cerebellar arteries appear patent. The bilateral PCAs are patent, without proximal stenosis or occlusion. There is no aneurysm or AVM. Venous sinuses: Patent. Anatomic variants: None. Review of the MIP images confirms the above findings IMPRESSION: 1. No acute intracranial pathology. 2. No emergent vascular finding. Patent vasculature of the head and neck with no hemodynamically significant stenosis, occlusion, or dissection. 3. Status post clipping of the left paraclinoid aneurysm without definite  residual aneurysm, though evaluation is degraded by streak artifact. 4. Chronic right sphenoid sinusitis. Findings communicated to Dr Iver Nestle at 11:28 am via AMION. Electronically Signed   By: Lesia Hausen M.D.   On: 09/30/2022 11:51    Pending Labs Unresulted Labs (From admission, onward)     Start     Ordered   10/01/22 0500  Hemoglobin A1c  (Labs)  Tomorrow morning,   R       Comments: To assess prior glycemic control    09/30/22 1309   10/01/22 0500  Lipid panel  Tomorrow morning,   R        09/30/22 1309   10/01/22 0500  Comprehensive metabolic panel  Tomorrow morning,   R        09/30/22 1310   10/01/22 0500  Magnesium  Tomorrow morning,   R        09/30/22 1310   09/30/22 1310  Magnesium  Add-on,   AD        09/30/22 1309            Vitals/Pain Today's Vitals   09/30/22 1130 09/30/22 1145 09/30/22 1200 09/30/22 1300  BP: 137/71 118/61 127/65 106/62  Pulse:  85 86 74  Resp:  17 13 14   Temp:      SpO2:  95% 96% 93%  Weight:      Height:      PainSc:        Isolation Precautions No active isolations  Medications Medications  sodium chloride flush (NS) 0.9 % injection 3 mL (3 mLs Intravenous Not Given 09/30/22 1137)  clopidogrel (PLAVIX) tablet 75 mg (has no administration in time range)  aspirin EC tablet 81 mg (has no administration in time range)   stroke: early stages of recovery book (has no administration in time range)  acetaminophen (TYLENOL) tablet 650 mg (has no administration in time range)    Or  acetaminophen (TYLENOL) 160 MG/5ML  solution 650 mg (has no administration in time range)    Or  acetaminophen (TYLENOL) suppository 650 mg (has no administration in time range)  senna-docusate (Senokot-S) tablet 1 tablet (has no administration in time range)  enoxaparin (LOVENOX) injection 40 mg (40 mg Subcutaneous Given 09/30/22 1359)  potassium chloride SA (KLOR-CON M) CR tablet 40 mEq (40 mEq Oral Given 09/30/22 1359)  iohexol (OMNIPAQUE) 350 MG/ML  injection 60 mL (60 mLs Intravenous Contrast Given 09/30/22 1132)  aspirin tablet 325 mg (325 mg Oral Given 09/30/22 1359)  clopidogrel (PLAVIX) tablet 300 mg (300 mg Oral Given 09/30/22 1359)    Mobility walks     Focused Assessments Neuro Assessment Handoff:  Swallow screen pass? Yes  Cardiac Rhythm: Normal sinus rhythm NIH Stroke Scale  Dizziness Present: No Headache Present: No Interval: Shift assessment Level of Consciousness (1a.)   : Alert, keenly responsive LOC Questions (1b. )   : Answers both questions correctly LOC Commands (1c. )   : Performs both tasks correctly Best Gaze (2. )  : Normal Visual (3. )  : No visual loss Facial Palsy (4. )    : Normal symmetrical movements Motor Arm, Left (5a. )   : No drift Motor Arm, Right (5b. ) : No drift Motor Leg, Left (6a. )  : No drift Motor Leg, Right (6b. ) : No drift Limb Ataxia (7. ): Absent Sensory (8. )  : Normal, no sensory loss Best Language (9. )  : No aphasia Dysarthria (10. ): Normal Extinction/Inattention (11.)   : No Abnormality Complete NIHSS TOTAL: 0 Last date known well: 09/30/22 Last time known well: 0700 Neuro Assessment: Within Defined Limits Neuro Checks:   Initial (09/30/22 1130)  Has TPA been given? No If patient is a Neuro Trauma and patient is going to OR before floor call report to 4N Charge nurse: [phone] or [phone]   R Recommendations: See Admitting Provider Note  Report given to:   Additional Notes:

## 2022-09-30 NOTE — ED Provider Notes (Signed)
[place] EMERGENCY DEPARTMENT AT [facility] Provider Note   CSN: [id number] Arrival date & time: 09/30/22  1050     History  Chief Complaint  Patient presents with   Code Stroke    [name] is a 67 y.o. female.  Patient is a 67 year old female with a past medical history of hypertension and Sjogren's presenting to the emergency department with visual field loss.  The patient states that the last couple of days she was sick with a viral infection with a nonproductive cough.  States that this morning she felt improved and was on her way to work when she started to notice that she could not see things on her right side in her peripheral vision.  She states that she did not feel like she was having any numbness or weakness.  She states that after about an hour her symptoms seem to improve which she spoke with her eye doctor and went to the ophthalmologist this morning who is concerned that she had a right visual field deficit and recommended that she come to the emergency department.  Patient denies any headache to me or any jaw claudication.  The history is provided by the patient.       Home Medications Prior to Admission medications   Medication Sig Start Date End Date Taking? Authorizing Provider  meloxicam (MOBIC) 15 MG tablet Take 1 tablet (15 mg total) by mouth daily. 05/12/22   Nelwyn Salisbury, MD  NIFEdipine (ADALAT CC) 30 MG 24 hr tablet Take 1 tablet (30 mg total) by mouth daily. 05/12/22   Nelwyn Salisbury, MD  valACYclovir (VALTREX) 500 MG tablet Take 1 tablet twice a day for 5 days as needed for fever blisters 11/24/17   Nelwyn Salisbury, MD  zolpidem (AMBIEN) 10 MG tablet TAKE 1 TABLET BY MOUTH EVERYDAY AT BEDTIME 04/22/22   Nelwyn Salisbury, MD      Allergies    Doxycycline    Review of Systems   Review of Systems  Physical Exam Updated Vital Signs BP (!) 121/97   Pulse 98   Temp 98.2 F (36.8 C)   Resp 16   Ht 5\' 7"  (1.702 m)   Wt 63.5 kg   SpO2  95%   BMI 21.93 kg/m  Physical Exam Vitals and nursing note reviewed.  Constitutional:      General: She is not in acute distress.    Appearance: Normal appearance.  HENT:     Head: Normocephalic and atraumatic.     Comments: No temporal tenderness to palpation bilaterally    Nose: Nose normal.     Mouth/Throat:     Mouth: Mucous membranes are moist.     Pharynx: Oropharynx is clear.  Eyes:     Extraocular Movements: Extraocular movements intact.     Conjunctiva/sclera: Conjunctivae normal.     Comments: Bilateral eyes dilated from ophthalmology exam this morning  Cardiovascular:     Rate and Rhythm: Normal rate and regular rhythm.     Heart sounds: Normal heart sounds.  Pulmonary:     Effort: Pulmonary effort is normal.     Breath sounds: Normal breath sounds.  Abdominal:     General: Abdomen is flat.     Palpations: Abdomen is soft.     Tenderness: There is no abdominal tenderness.  Musculoskeletal:        General: Normal range of motion.     Cervical back: Normal range of motion and neck supple.  Skin:    General: Skin is warm and dry.  Neurological:     General: No focal deficit present.     Mental Status: She is alert and oriented to person, place, and time.     Cranial Nerves: No cranial nerve deficit.     Sensory: No sensory deficit.     Motor: No weakness.     Coordination: Coordination normal.     Comments: No drift in all 4 extremities  Psychiatric:        Mood and Affect: Mood normal.        Behavior: Behavior normal.     ED Results / Procedures / Treatments   Labs (all labs ordered are listed, but only abnormal results are displayed) Labs Reviewed  PROTIME-INR - Abnormal; Notable for the following components:      Result Value   Prothrombin Time 15.7 (*)    All other components within normal limits  DIFFERENTIAL - Abnormal; Notable for the following components:   Neutro Abs 7.8 (*)    All other components within normal limits  COMPREHENSIVE  METABOLIC PANEL - Abnormal; Notable for the following components:   Potassium 3.0 (*)    CO2 21 (*)    Glucose, Bld 126 (*)    Creatinine, Ser 1.15 (*)    Total Protein 8.8 (*)    GFR, Estimated 53 (*)    All other components within normal limits  CBG MONITORING, ED - Abnormal; Notable for the following components:   Glucose-Capillary 123 (*)    All other components within normal limits  I-STAT CHEM 8, ED - Abnormal; Notable for the following components:   Potassium 3.1 (*)    Creatinine, Ser 1.10 (*)    Glucose, Bld 126 (*)    Calcium, Ion 1.08 (*)    TCO2 19 (*)    All other components within normal limits  APTT  CBC  ETHANOL  CBG MONITORING, ED    EKG EKG Interpretation  Date/Time:  Friday Sep 30 2022 11:44:04 EDT Ventricular Rate:  85 PR Interval:  145 QRS Duration: 94 QT Interval:  359 QTC Calculation: 427 R Axis:   85 Text Interpretation: Sinus rhythm Borderline right axis deviation Interpretation limited secondary to artifact No significant change since last tracing Confirmed by Elayne Snare ([id number]) on 09/30/2022 11:46:52 AM  Radiology CT HEAD CODE STROKE WO CONTRAST  Result Date: 09/30/2022 CLINICAL DATA:  Code stroke. Right eye vision loss and right-sided weakness. EXAM: CT ANGIOGRAPHY HEAD AND NECK TECHNIQUE: Multidetector CT imaging of the head and neck was performed using the standard protocol during bolus administration of intravenous contrast. Multiplanar CT image reconstructions and MIPs were obtained to evaluate the vascular anatomy. Carotid stenosis measurements (when applicable) are obtained utilizing NASCET criteria, using the distal internal carotid diameter as the denominator. RADIATION DOSE REDUCTION: This exam was performed according to the departmental dose-optimization program which includes automated exposure control, adjustment of the mA and/or kV according to patient size and/or use of iterative reconstruction technique. CONTRAST:  60 cc Omnipaque  350 COMPARISON:  None Available. FINDINGS: CT HEAD FINDINGS Brain: There is no acute intracranial hemorrhage, extra-axial fluid collection, or acute infarct Parenchymal volume is normal. The ventricles are normal in size. Gray-white differentiation is preserved. Postsurgical changes reflecting left frontotemporal craniotomy for aneurysm clipping are noted. The pituitary and suprasellar region are normal. There is no mass lesion. There is no mass effect or midline shift. Vascular: A left paraclinoid ICA aneurysm is noted. No hyperdense vessel  is seen. Skull: Postsurgical changes as above. There is no acute calvarial fracture. Sinuses/Orbits: There is chronic right sphenoid sinusitis with near-complete opacification. The globes and orbits are unremarkable. Other: None. ASPECTS [facility] Stroke Program Early CT Score) - Ganglionic level infarction (caudate, lentiform nuclei, internal capsule, insula, M1-M3 cortex): 7 - Supraganglionic infarction (M4-M6 cortex): 3 Total score (0-10 with 10 being normal): 10 CTA NECK FINDINGS Aortic arch: The imaged aortic arch is normal. The origin of the left subclavian artery is patent. The origins of the brachiocephalic and left common carotid artery are largely excluded from the field of view. The subclavian arteries are patent to the level imaged. Right carotid system: The right common, internal, and external carotid arteries are patent, without hemodynamically significant stenosis or occlusion. There is no evidence of dissection or aneurysm. Left carotid system: The left common, internal, and external carotid arteries are patent, without hemodynamically significant stenosis or occlusion. There is no evidence of dissection or aneurysm. Vertebral arteries: The vertebral arteries are patent, without hemodynamically significant stenosis or occlusion there is no evidence of dissection or aneurysm. Skeleton: There is no acute osseous abnormality or suspicious osseous lesion. There is  grade 1 anterolisthesis of C3 on C4 with reversal of the normal cervical curvature centered at C5. 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 There is no visible canal hematoma. Other neck: The soft tissues of the neck are unremarkable. Upper chest: The imaged lung apices are clear. Review of the MIP images confirms the above findings CTA HEAD FINDINGS Anterior circulation: Streak artifact from the aneurysm clip partially obscures portions of the distal left intracranial ICA and proximal left MCA. Within this confine: The intracranial ICAs are patent with minimal calcified plaque on the left but no significant stenosis or occlusion. The bilateral MCAs are patent, without proximal stenosis or occlusion. The bilateral ACAs are patent, without proximal stenosis or occlusion. The patient is status post clipping of the left paraclinoid aneurysm. There is no definite residual or recurrent aneurysm, though evaluation is degraded by streak artifact. There is no other aneurysm. There is no AVM. Posterior circulation: The bilateral V4 segments are patent. The basilar artery is patent. The major cerebellar arteries appear patent. The bilateral PCAs are patent, without proximal stenosis or occlusion. There is no aneurysm or AVM. Venous sinuses: Patent. Anatomic variants: None. Review of the MIP images confirms the above findings IMPRESSION: 1. No acute intracranial pathology. 2. No emergent vascular finding. Patent vasculature of the head and neck with no hemodynamically significant stenosis, occlusion, or dissection. 3. Status post clipping of the left paraclinoid aneurysm without definite residual aneurysm, though evaluation is degraded by streak artifact. 4. Chronic right sphenoid sinusitis. Findings communicated to Dr Iver Nestle at 11:28 am via AMION. Electronically Signed   By: Lesia Hausen M.D.   On: 09/30/2022 11:51   CT ANGIO HEAD NECK W WO CM (CODE  STROKE)  Result Date: 09/30/2022 CLINICAL DATA:  Code stroke. Right eye vision loss and right-sided weakness. EXAM: CT ANGIOGRAPHY HEAD AND NECK TECHNIQUE: Multidetector CT imaging of the head and neck was performed using the standard protocol during bolus administration of intravenous contrast. Multiplanar CT image reconstructions and MIPs were obtained to evaluate the vascular anatomy. Carotid stenosis measurements (when applicable) are obtained utilizing NASCET criteria, using the distal internal carotid diameter as the denominator. RADIATION DOSE REDUCTION: This exam was performed according to the departmental dose-optimization program which includes automated exposure control, adjustment of the mA  and/or kV according to patient size and/or use of iterative reconstruction technique. CONTRAST:  60 cc Omnipaque 350 COMPARISON:  None Available. FINDINGS: CT HEAD FINDINGS Brain: There is no acute intracranial hemorrhage, extra-axial fluid collection, or acute infarct Parenchymal volume is normal. The ventricles are normal in size. Gray-white differentiation is preserved. Postsurgical changes reflecting left frontotemporal craniotomy for aneurysm clipping are noted. The pituitary and suprasellar region are normal. There is no mass lesion. There is no mass effect or midline shift. Vascular: A left paraclinoid ICA aneurysm is noted. No hyperdense vessel is seen. Skull: Postsurgical changes as above. There is no acute calvarial fracture. Sinuses/Orbits: There is chronic right sphenoid sinusitis with near-complete opacification. The globes and orbits are unremarkable. Other: None. ASPECTS [facility] Stroke Program Early CT Score) - Ganglionic level infarction (caudate, lentiform nuclei, internal capsule, insula, M1-M3 cortex): 7 - Supraganglionic infarction (M4-M6 cortex): 3 Total score (0-10 with 10 being normal): 10 CTA NECK FINDINGS Aortic arch: The imaged aortic arch is normal. The origin of the left subclavian  artery is patent. The origins of the brachiocephalic and left common carotid artery are largely excluded from the field of view. The subclavian arteries are patent to the level imaged. Right carotid system: The right common, internal, and external carotid arteries are patent, without hemodynamically significant stenosis or occlusion. There is no evidence of dissection or aneurysm. Left carotid system: The left common, internal, and external carotid arteries are patent, without hemodynamically significant stenosis or occlusion. There is no evidence of dissection or aneurysm. Vertebral arteries: The vertebral arteries are patent, without hemodynamically significant stenosis or occlusion there is no evidence of dissection or aneurysm. Skeleton: There is no acute osseous abnormality or suspicious osseous lesion. There is grade 1 anterolisthesis of C3 on C4 with reversal of the normal cervical curvature centered at C5. 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 There is no visible canal hematoma. Other neck: The soft tissues of the neck are unremarkable. Upper chest: The imaged lung apices are clear. Review of the MIP images confirms the above findings CTA HEAD FINDINGS Anterior circulation: Streak artifact from the aneurysm clip partially obscures portions of the distal left intracranial ICA and proximal left MCA. Within this confine: The intracranial ICAs are patent with minimal calcified plaque on the left but no significant stenosis or occlusion. The bilateral MCAs are patent, without proximal stenosis or occlusion. The bilateral ACAs are patent, without proximal stenosis or occlusion. The patient is status post clipping of the left paraclinoid aneurysm. There is no definite residual or recurrent aneurysm, though evaluation is degraded by streak artifact. There is no other aneurysm. There is no AVM. Posterior circulation: The bilateral V4 segments are  patent. The basilar artery is patent. The major cerebellar arteries appear patent. The bilateral PCAs are patent, without proximal stenosis or occlusion. There is no aneurysm or AVM. Venous sinuses: Patent. Anatomic variants: None. Review of the MIP images confirms the above findings IMPRESSION: 1. No acute intracranial pathology. 2. No emergent vascular finding. Patent vasculature of the head and neck with no hemodynamically significant stenosis, occlusion, or dissection. 3. Status post clipping of the left paraclinoid aneurysm without definite residual aneurysm, though evaluation is degraded by streak artifact. 4. Chronic right sphenoid sinusitis. Findings communicated to Dr Iver Nestle at 11:28 am via AMION. Electronically Signed   By: Lesia Hausen M.D.   On: 09/30/2022 11:51    Procedures Procedures    Medications Ordered in ED Medications  sodium chloride flush (NS) 0.9 % injection 3 mL (3 mLs Intravenous Not Given 09/30/22 1137)  aspirin tablet 325 mg (has no administration in time range)  clopidogrel (PLAVIX) tablet 300 mg (has no administration in time range)  clopidogrel (PLAVIX) tablet 75 mg (has no administration in time range)  aspirin EC tablet 81 mg (has no administration in time range)  potassium chloride SA (KLOR-CON M) CR tablet 40 mEq (has no administration in time range)  iohexol (OMNIPAQUE) 350 MG/ML injection 60 mL (60 mLs Intravenous Contrast Given 09/30/22 1132)    ED Course/ Medical Decision Making/ A&P Clinical Course as of 09/30/22 1257  Fri Sep 30, 2022  1248 Labs with mild hypokalemia which will be repeated. Dr. Tempie Hoist with neurology recommends medicine admission for TIA work up and repeat CTH in 24 hrs as the patient is unable to have MRI with history of aneurysm clip. [VK]    Clinical Course User Index [VK] Rexford Maus, DO                             Medical Decision Making This patient presents to the ED with chief complaint(s) of visual field loss with  pertinent past medical history of hypertension, Sjgren which further complicates the presenting complaint. The complaint involves an extensive differential diagnosis and also carries with it a high risk of complications and morbidity.    The differential diagnosis includes CVA, TIA, ICH, mass effect, electrolyte abnormality, patient has no headaches, jaw claudication or temporal tenderness making temporal arteritis less likely  Additional history obtained: Additional history obtained from outpatient ophthalmologist Records reviewed Primary Care Documents  ED Course and Reassessment: On patient's arrival to the emergency department due to her new onset visual field loss with concern for facial droop she was made a stroke alert on arrival.  Patient was immediately transported to CT scanner and neurology evaluated at bedside.  The patient's Accu-Chek on arrival was normal and blood pressures were stable.  Patient reports to me that her vision appears to be back to her baseline though does have a possible visual field deficit to the right still on my exam.  Independent labs interpretation:  The following labs were independently interpreted: Mild hypokalemia otherwise within normal range  Independent visualization of imaging: - I independently visualized the following imaging with scope of interpretation limited to determining acute life threatening conditions related to emergency care: CT head/CTA, which revealed no acute abnormality  Consultation: - Consulted or discussed management/test interpretation w/ external professional: Neurology, hospitalist  Consideration for admission or further workup: Requires admission for further TIA workup. Social Determinants of health: N/A    Amount and/or Complexity of Data Reviewed Labs: ordered. Radiology: ordered.          Final Clinical Impression(s) / ED Diagnoses Final diagnoses:  TIA (transient ischemic attack)  Transient vision  disturbance  Hypokalemia    Rx / DC Orders ED Discharge Orders     None         Rexford Maus, DO 09/30/22 1257

## 2022-09-30 NOTE — Plan of Care (Signed)
Admission Note:  Pt arrived from the ED to [facility] at approximately 1710 today. Pt is alert and oriented x4, on room air, and denied pain. Pt is a Q4 NIH, and score was 0. Pt is independent at baseline, and does not use any assistive devices however, pt has weakness in right hip, due to osteoarthritis and requires standby to contact guard assist for mobility. Pt informed this Clinical research associate, primary RN Chai Routh, that prior to coming to the ED, pt had both pupils of eyes dilated by her ophthalmologist, with the right pupil a size 3, and the left pupil a size 4, and both pupils reactive to light. Pt in NAD at this time.    Problem: Education: Goal: Knowledge of disease or condition will improve Outcome: Progressing Goal: Knowledge of secondary prevention will improve (MUST DOCUMENT ALL) Outcome: Progressing Goal: Knowledge of patient specific risk factors will improve Loraine Leriche N/A or DELETE if not current risk factor) Outcome: Progressing   Problem: Ischemic Stroke/TIA Tissue Perfusion: Goal: Complications of ischemic stroke/TIA will be minimized Outcome: Progressing   Problem: Coping: Goal: Will verbalize positive feelings about self Outcome: Progressing Goal: Will identify appropriate support needs Outcome: Progressing   Problem: Health Behavior/Discharge Planning: Goal: Ability to manage health-related needs will improve Outcome: Progressing Goal: Goals will be collaboratively established with patient/family Outcome: Progressing   Problem: Self-Care: Goal: Ability to participate in self-care as condition permits will improve Outcome: Progressing Goal: Verbalization of feelings and concerns over difficulty with self-care will improve Outcome: Progressing Goal: Ability to communicate needs accurately will improve Outcome: Progressing   Problem: Nutrition: Goal: Risk of aspiration will decrease Outcome: Progressing Goal: Dietary intake will improve Outcome: Progressing   Problem:  Education: Goal: Knowledge of General Education information will improve Description: Including pain rating scale, medication(s)/side effects and non-pharmacologic comfort measures Outcome: Progressing   Problem: Health Behavior/Discharge Planning: Goal: Ability to manage health-related needs will improve Outcome: Progressing   Problem: Clinical Measurements: Goal: Ability to maintain clinical measurements within normal limits will improve Outcome: Progressing Goal: Will remain free from infection Outcome: Progressing Goal: Diagnostic test results will improve Outcome: Progressing Goal: Respiratory complications will improve Outcome: Progressing Goal: Cardiovascular complication will be avoided Outcome: Progressing   Problem: Activity: Goal: Risk for activity intolerance will decrease Outcome: Progressing   Problem: Nutrition: Goal: Adequate nutrition will be maintained Outcome: Progressing   Problem: Coping: Goal: Level of anxiety will decrease Outcome: Progressing   Problem: Elimination: Goal: Will not experience complications related to bowel motility Outcome: Progressing Goal: Will not experience complications related to urinary retention Outcome: Progressing   Problem: Pain Managment: Goal: General experience of comfort will improve Outcome: Progressing   Problem: Safety: Goal: Ability to remain free from injury will improve Outcome: Progressing   Problem: Skin Integrity: Goal: Risk for impaired skin integrity will decrease Outcome: Progressing

## 2022-09-30 NOTE — ED Notes (Signed)
Code Stroke activated per verbal order from RN Chg.

## 2022-10-01 ENCOUNTER — Observation Stay (HOSPITAL_COMMUNITY): Payer: BC Managed Care – PPO

## 2022-10-01 DIAGNOSIS — E876 Hypokalemia: Secondary | ICD-10-CM

## 2022-10-01 DIAGNOSIS — Z8679 Personal history of other diseases of the circulatory system: Secondary | ICD-10-CM

## 2022-10-01 DIAGNOSIS — I63412 Cerebral infarction due to embolism of left middle cerebral artery: Secondary | ICD-10-CM | POA: Diagnosis not present

## 2022-10-01 DIAGNOSIS — G459 Transient cerebral ischemic attack, unspecified: Secondary | ICD-10-CM | POA: Diagnosis not present

## 2022-10-01 LAB — COMPREHENSIVE METABOLIC PANEL
ALT: 16 U/L (ref 0–44)
AST: 16 U/L (ref 15–41)
Albumin: 3.1 g/dL — ABNORMAL LOW (ref 3.5–5.0)
Alkaline Phosphatase: 64 U/L (ref 38–126)
Anion gap: 8 (ref 5–15)
BUN: 15 mg/dL (ref 8–23)
CO2: 21 mmol/L — ABNORMAL LOW (ref 22–32)
Calcium: 8.8 mg/dL — ABNORMAL LOW (ref 8.9–10.3)
Chloride: 107 mmol/L (ref 98–111)
Creatinine, Ser: 1 mg/dL (ref 0.44–1.00)
GFR, Estimated: >60 mL/min (ref >60)
Glucose, Bld: 97 mg/dL (ref 70–99)
Potassium: 3.3 mmol/L — ABNORMAL LOW (ref 3.5–5.1)
Sodium: 136 mmol/L (ref 135–145)
Total Bilirubin: 0.9 mg/dL (ref 0.3–1.2)
Total Protein: 7.7 g/dL (ref 6.5–8.1)

## 2022-10-01 LAB — RAPID URINE DRUG SCREEN, HOSP PERFORMED
Amphetamines: NOT DETECTED
Barbiturates: NOT DETECTED
Benzodiazepines: NOT DETECTED
Cocaine: NOT DETECTED
Opiates: NOT DETECTED
Tetrahydrocannabinol: NOT DETECTED

## 2022-10-01 LAB — LIPID PANEL
Cholesterol: 105 mg/dL (ref 0–200)
HDL: 38 mg/dL — ABNORMAL LOW (ref >40)
LDL Cholesterol: 52 mg/dL (ref 0–99)
Total CHOL/HDL Ratio: 2.8 RATIO
Triglycerides: 77 mg/dL (ref <150)
VLDL: 15 mg/dL (ref 0–40)

## 2022-10-01 LAB — GLUCOSE, CAPILLARY
Glucose-Capillary: 87 mg/dL (ref 70–99)
Glucose-Capillary: 104 mg/dL — ABNORMAL HIGH (ref 70–99)

## 2022-10-01 LAB — HEMOGLOBIN A1C
Hgb A1c MFr Bld: 5.2 % (ref 4.8–5.6)
Mean Plasma Glucose: 102.54 mg/dL

## 2022-10-01 LAB — MAGNESIUM: Magnesium: 1.9 mg/dL (ref 1.7–2.4)

## 2022-10-01 MED ORDER — APIXABAN 5 MG PO TABS: 5.0000 mg | ORAL_TABLET | Freq: Two times a day (BID) | ORAL | 1 refills | Status: DC

## 2022-10-01 MED ORDER — POTASSIUM CHLORIDE CRYS ER 20 MEQ PO TBCR
40.0000 meq | EXTENDED_RELEASE_TABLET | Freq: Once | ORAL | Status: AC
  Administered 2022-10-01: 40 meq via ORAL
  Filled 2022-10-01: qty 2

## 2022-10-01 NOTE — TOC Transition Note (Signed)
Transition of Care [facility]) - CM/SW Discharge Note   Patient Details  Name: [name] MRN: [id number] Date of Birth: 10/27/1955  [facility] CM/SW Contact:  Gordy Clement, RN Phone Number: 10/01/2022, 1:26 PM   Clinical Narrative:     Patient will DC to home today with Daughter.  Eliquis coupon given  No additional TOC needs    Final next level of care: Home/Self Care Barriers to Discharge: No Barriers Identified   Patient Goals and CMS Choice CMS Medicare.gov Compare Post Acute Care list provided to::  (N/A) Choice offered to / list presented to : NA  Discharge Placement                         Discharge Plan and Services Additional resources added to the After Visit Summary for   In-house Referral: NA Discharge Planning Services: Medication Assistance Post Acute Care Choice: NA          DME Arranged: N/A DME Agency: NA       HH Arranged: NA HH Agency: NA        Social Determinants of Health (SDOH) Interventions SDOH Screenings   Food Insecurity: No Food Insecurity (09/30/2022)  Housing: Low Risk  (09/30/2022)  Transportation Needs: No Transportation Needs (09/30/2022)  Utilities: Not At Risk (09/30/2022)  Depression (PHQ2-9): Low Risk  (05/12/2022)  Tobacco Use: Low Risk  (09/30/2022)     Readmission Risk Interventions     No data to display

## 2022-10-01 NOTE — Discharge Instructions (Signed)

## 2022-10-01 NOTE — Evaluation (Signed)
Speech Language Pathology Evaluation Patient Details Name: [name] MRN: [id number] DOB: 1955-12-30 Today's Date: 10/01/2022 Time: 4098-1191 SLP Time Calculation (min) (ACUTE ONLY): 11 min  Problem List:  Patient Active Problem List   Diagnosis Date Noted   TIA (transient ischemic attack) 09/30/2022   Sjogren's syndrome (HCC) 12/16/2019   Hx of atrial fibrillation, no current medication 12/16/2019   Depression with anxiety 03/07/2017   Essential hypertension 11/21/2006   Past Medical History:  Past Medical History:  Diagnosis Date   Acute renal failure (ARF) (HCC)    Headache(784.0)    Hypertension    Insomnia    Intracranial aneurysm    status post surgery   Neurotrophic keratitis of both eyes    sees Dr. Sinda Du of [facility]   [facility]    sees Dr. Alben Deeds    Past Surgical History:  Past Surgical History:  Procedure Laterality Date   ABDOMINAL HYSTERECTOMY     CEREBRAL ANEURYSM REPAIR  1998   By Dr Newell Coral   COLONOSCOPY  10/01/2015   per Dr. Marina Goodell, adenomatous polyps, repeat in 5 yrs    OVARIAN CYST REMOVAL     HPI:  67 y.o. female with medical history significant of hypertension, Sjogren's syndrome, cerebral aneurysm status post coiling in 1998, paroxysmal A-fib not on anticoagulation presented with blurred vision in the right eye. Neurological imaging workup pending.   Assessment / Plan / Recommendation Clinical Impression  Pts cognitive linguistic skills appeared intact. She continues to have isolated symptom of right visual field deficits. Oral motor exam was unremarkable. Pt oriented, with good recall of hospital events. She still works full time as a Solicitor for [employer] and has supportive daughter locally. Receptive and expressive speech and motor speech skills were intact. No acute changes noted with cognitive linguistics; she was able to efficiently and quickly complete clock drawing task. No ST needs identified.     SLP Assessment  SLP Recommendation/Assessment: Patient does not need any further Speech Lanaguage Pathology Services SLP Visit Diagnosis: Cognitive communication deficit (R41.841)    Recommendations for follow up therapy are one component of a multi-disciplinary discharge planning process, led by the attending physician.  Recommendations may be updated based on patient status, additional functional criteria and insurance authorization.    Follow Up Recommendations  No SLP follow up    Assistance Recommended at Discharge  None  Functional Status Assessment Patient has not had a recent decline in their functional status  Frequency and Duration           SLP Evaluation Cognition  Overall Cognitive Status: Within Functional Limits for tasks assessed Arousal/Alertness: Awake/alert Orientation Level: Oriented X4 Month: May Day of Week: Correct Attention: Focused;Sustained Focused Attention: Appears intact Sustained Attention: Appears intact Memory: Appears intact Awareness: Appears intact Problem Solving: Appears intact Executive Function: Reasoning;Organizing Reasoning: Appears intact Organizing: Appears intact Safety/Judgment: Appears intact       Comprehension  Auditory Comprehension Overall Auditory Comprehension: Appears within functional limits for tasks assessed Visual Recognition/Discrimination Discrimination: Exceptions to [facility] (right sided visual deficits; OT to perform further testing) Reading Comprehension Reading Status: Within funtional limits    Expression Expression Primary Mode of Expression: Verbal Verbal Expression Overall Verbal Expression: Appears within functional limits for tasks assessed Written Expression Dominant Hand: Right   Oral / Motor  Oral Motor/Sensory Function Overall Oral Motor/Sensory Function: Within functional limits Motor Speech Overall Motor Speech: Appears within functional limits for tasks assessed  Ardyth Gal MA,  CCC-SLP Acute Rehabilitation Services   10/01/2022, 10:22 AM

## 2022-10-01 NOTE — Progress Notes (Signed)
OT Cancellation Note  Patient Details Name: ANUHYA SURGEON MRN: [id number] DOB: 1956-03-05   Cancelled Treatment:    Reason Eval/Treat Not Completed: Other (comment) (Going to CT. Will follow up.) Alphia Moh OTR/L  Acute Rehab Services  [phone] office number    Alphia Moh 10/01/2022, 9:16 AM

## 2022-10-01 NOTE — Progress Notes (Signed)
Pt is anxious and difficulty trying to explain how she feels on her vision. She said that whenever she watch a television she said that the eyes of the person in the TV seems to be "blank". Also whenever she face the mirror she said that her right vision seems to be a "blind spot". Claims on blurry of vision at right eye. Pupil size is 3mm on both, round, brisk and reactive to light. NIH score done q4h and score is zero at 2000,0000 and 0400 respectively. Aox4. No further significant complaints made aside from mild headache at 0000 which is resolved with Tylenol. Will continue to monitor the patient.

## 2022-10-01 NOTE — Evaluation (Signed)
Physical Therapy Evaluation Patient Details Name: [name] MRN: [id number] DOB: 18-Jul-1955 Today's Date: 10/01/2022  History of Present Illness  Pt is a 67 yr old female who presented 5/10 due to blurred vision. CT negative. Pending repeat CT. PMH: HTN, Sjogren's syndrome, cerebral aneurysm status post coiling in 1998, paroxysmal A-fib  Clinical Impression  Pt tolerated today's session well, mainly limited by antalgic gait on RLE which pt reports hip OA. Pt able to complete all mobility modified independence and increased time due to pain. Sensation and coordination equal in BLE, limited hip flexion strength due to pain. Pt educated on use of RW or SPC for support during ambulation but pt declining at this time, educated on proper use and sequencing with SPC if pt decides to use one, educated on places to purchase them. Pt reports blurriness with R lateral vision, able to ambulate without path deviation and good obstacle navigation, no increase in symptoms or dizziness with mobility. Recommend OPPT upon discharge for R hip pain, pt reports plans to see orthopedic doctor for evaluation. Pt reports no concerns with mobility upon discharge, acute PT will sign off at this time, recommend return home with OPPT.      Recommendations for follow up therapy are one component of a multi-disciplinary discharge planning process, led by the attending physician.  Recommendations may be updated based on patient status, additional functional criteria and insurance authorization.  Follow Up Recommendations       Assistance Recommended at Discharge PRN  Patient can return home with the following  Help with stairs or ramp for entrance    Equipment Recommendations None recommended by PT  Recommendations for Other Services       Functional Status Assessment Patient has had a recent decline in their functional status and demonstrates the ability to make significant improvements in function in a reasonable  and predictable amount of time.     Precautions / Restrictions Precautions Precautions: Fall Precaution Comments: reports R hip OA and is planning to make an ortho appointment Restrictions Weight Bearing Restrictions: No      Mobility  Bed Mobility Overal bed mobility: Independent             General bed mobility comments: no issues, no use of bed    Transfers Overall transfer level: Modified independent Equipment used: None               General transfer comment: mildly increased time    Ambulation/Gait Ambulation/Gait assistance: Modified independent (Device/Increase time) Gait Distance (Feet): 75 Feet Assistive device: None Gait Pattern/deviations: Trendelenburg, Antalgic, Trunk flexed, Step-through pattern, Decreased stance time - right Gait velocity: decreased     General Gait Details: Left trunk lean due to pain on R leg with mobility, has antalgic and Trendelenberg gait. Educated pt on use of RW or SPC for mobility and support during R stance phase but pt declining any attempts at use today  Information systems manager [employer] (Stroke Patients Only)       Balance Overall balance assessment: Needs assistance Sitting-balance support: No upper extremity supported, Feet supported Sitting balance-Leahy Scale: Good     Standing balance support: No upper extremity supported, During functional activity Standing balance-Leahy Scale: Good                               Pertinent Vitals/Pain Pain  Assessment Pain Assessment: Faces Faces Pain Scale: Hurts even more Pain Location: R hip with ambulation Pain Descriptors / Indicators: Aching, Discomfort Pain Intervention(s): Limited activity within patient's tolerance, Monitored during session, Repositioned    Home Living Family/patient expects to be discharged to:: Private residence Living Arrangements: Alone Available Help at Discharge: Family;Available  PRN/intermittently Type of Home: Apartment Home Access: Level entry       Home Layout: One level Home Equipment: None Additional Comments: family can be full time if warranted    Prior Function Prior Level of Function : Independent/Modified Independent;Working/employed;Driving             Mobility Comments: independent, works as Air traffic controller at [employer]. Reports R hip pain with mobility recently but is able to make do currently, denies falls       Hand Dominance   Dominant Hand: Right    Extremity/Trunk Assessment   Upper Extremity Assessment Upper Extremity Assessment: Defer to OT evaluation    Lower Extremity Assessment Lower Extremity Assessment: RLE deficits/detail RLE Deficits / Details: ankle PF/DF and knee extension/flexion grossly WFL, limited R hip flexion ~3-/5 due to pain. Sensation intact bilaterally    Cervical / Trunk Assessment Cervical / Trunk Assessment: Kyphotic  Communication   Communication: No difficulties  Cognition Arousal/Alertness: Awake/alert Behavior During Therapy: WFL for tasks assessed/performed Overall Cognitive Status: Within Functional Limits for tasks assessed                                 General Comments: A&Ox4 and pleasant throughout session        General Comments General comments (skin integrity, edema, etc.): VSS on room air    Exercises     Assessment/Plan    PT Assessment All further PT needs can be met in the next venue of care  PT Problem List Decreased strength;Decreased range of motion;Decreased activity tolerance;Decreased mobility;Decreased knowledge of use of DME;Pain       PT Treatment Interventions      PT Goals (Current goals can be found in the Care Plan section)  Acute Rehab PT Goals Patient Stated Goal: go home PT Goal Formulation: All assessment and education complete, DC therapy    Frequency       Co-evaluation               AM-PAC PT "6 Clicks" Mobility   Outcome Measure Help needed turning from your back to your side while in a flat bed without using bedrails?: None Help needed moving from lying on your back to sitting on the side of a flat bed without using bedrails?: None Help needed moving to and from a bed to a chair (including a wheelchair)?: None Help needed standing up from a chair using your arms (e.g., wheelchair or bedside chair)?: None Help needed to walk in hospital room?: None Help needed climbing 3-5 steps with a railing? : A Little 6 Click Score: 23    End of Session Equipment Utilized During Treatment: Gait belt Activity Tolerance: Patient tolerated treatment well;Patient limited by pain Patient left: in bed;with call bell/phone within reach Nurse Communication: Mobility status PT Visit Diagnosis: Difficulty in walking, not elsewhere classified (R26.2);Pain Pain - Right/Left: Right Pain - part of body: Hip    Time: 1914-7829 PT Time Calculation (min) (ACUTE ONLY): 13 min   Charges:   PT Evaluation $PT Eval Low Complexity: 1 Low  Lindalou Hose, PT DPT Acute Rehabilitation Services Office [phone]   Leonie Man 10/01/2022, 2:32 PM

## 2022-10-01 NOTE — Discharge Summary (Signed)
PATIENT DETAILS Name: [name] Age: 67 y.o. Sex: female Date of Birth: November 24, 1955 MRN: [id number]. 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  Patient was subsequently admitted for further workup  Stroke workup: 5/10>> CT head: No acute intracranial pathology 5/10>> CTA head/neck: No LVO/no stenosis. 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   Discussed at length with stroke MD-Dr. Xu-recommendations are to initiate anticoagulation with Eliquis-see below discussion.  History of PAF Maintaining sinus rhythm Review of prior cardiology outpatient note-okay from 2017-since A-fib was associated with pericarditis-patient was only anticoagulated for around 6 weeks.  Given the fact that she has had a CVA-which is highly suspicious to be of embolic etiology-plans are to initiate anticoagulation. Please ensure outpatient  follow-up with a cardiologist-Dr. Jens Som (epic message sent to cardiology team)  HTN Resume nifedipine  Hypokalemia Replete prior to discharge  Sjogren's syndrome Does not appear to be in flare Resume outpatient follow-up with rheumatology as previously scheduled.  BMI: Estimated body mass index is 21.93 kg/m as calculated from the following:   Height as of this encounter: 5\' 7"  (1.702 m).   Weight as of this encounter: 63.5 kg.    Discharge Diagnoses:  Principal Problem:   TIA (transient ischemic attack)   Discharge Instructions:  Activity:  As tolerated   Discharge Instructions     Ambulatory referral to Neurology   Complete by: As directed    An appointment is requested in approximately: 8 weeks   Call MD for:  extreme fatigue   Complete by: As directed    Call MD for:  persistant dizziness or light-headedness   Complete by: As directed    Diet - low sodium heart healthy   Complete by: As directed    Discharge instructions   Complete by: As directed    Follow with Primary MD  Nelwyn Salisbury, MD in 1-2 weeks  Follow-up with stroke clinic-their office will call you with a follow-up appointment  Follow-up with cardiology-their office will call you with a follow-up appointment-including making arrangements for outpatient cardiac monitoring.  Please get a complete blood count and chemistry panel checked by your Primary MD at your next visit, and again as instructed by your Primary MD.  Get Medicines reviewed and adjusted: Please take all your medications with you for your  next visit with your Primary MD  Laboratory/radiological data: Please request your Primary MD to go over all hospital tests and procedure/radiological results at the follow up, please ask your Primary MD to get all Hospital records sent to his/her office.  In some cases, they will be blood work, cultures and biopsy results pending at the time of your discharge. Please request that your primary  care M.D. follows up on these results.  Also Note the following: If you experience worsening of your admission symptoms, develop shortness of breath, life threatening emergency, suicidal or homicidal thoughts you must seek medical attention immediately by calling 911 or calling your MD immediately  if symptoms less severe.  You must read complete instructions/literature along with all the possible adverse reactions/side effects for all the Medicines you take and that have been prescribed to you. Take any new Medicines after you have completely understood and accpet all the possible adverse reactions/side effects.   Do not drive when taking Pain medications or sleeping medications (Benzodaizepines)  Do not take more than prescribed Pain, Sleep and Anxiety Medications. It is not advisable to combine anxiety,sleep and pain medications without talking with your primary care practitioner  Special Instructions: If you have smoked or chewed Tobacco  in the last 2 yrs please stop smoking, stop any regular Alcohol  and or any Recreational drug use.  Wear Seat belts while driving.  Please note: You were cared for by a hospitalist during your hospital stay. Once you are discharged, your primary care physician will handle any further medical issues. Please note that NO REFILLS for any discharge medications will be authorized once you are discharged, as it is imperative that you return to your primary care physician (or establish a relationship with a primary care physician if you do not have one) for your post hospital discharge needs so that they can reassess your need for medications and monitor your lab values.   Increase activity slowly   Complete by: As directed       Allergies as of 10/01/2022       Reactions   Vibra-tab [doxycycline] Nausea Only, Other (See Comments)   Triggered A-fib        Medication List     STOP taking these medications    ibuprofen 200 MG tablet Commonly known as:  ADVIL   meloxicam 15 MG tablet Commonly known as: MOBIC   valACYclovir 500 MG tablet Commonly known as: Valtrex       TAKE these medications    apixaban 5 MG Tabs tablet Commonly known as: ELIQUIS Take 1 tablet (5 mg total) by mouth 2 (two) times daily. Start taking on: Oct 02, 2022   moxifloxacin 0.5 % ophthalmic solution Commonly known as: VIGAMOX Place 1 drop into both eyes 3 (three) times daily. Continuous course.   NIFEdipine 30 MG 24 hr tablet Commonly known as: ADALAT CC Take 1 tablet (30 mg total) by mouth daily.   REFRESH OP Place 1 drop into both eyes every 6 (six) hours as needed (dry eyes).   ZOFRAN PO Take 1 tablet by mouth daily as needed (nausea, vomiting).   zolpidem 10 MG tablet Commonly known as: AMBIEN TAKE 1 TABLET BY MOUTH EVERYDAY AT BEDTIME What changed: See the new instructions.        Follow-up Information     Nelwyn Salisbury, MD. Schedule an appointment as soon as possible for a visit in 1 week(s).   Specialty: Family Medicine Contact information: [phone] [place] [address] [phone]         [facility] Follow up.   Specialty: Neurology Why: Hospital follow up, Office will call with date/time, If you dont hear from them,please give them a call Contact information: [address] [place] [address] [phone]        Lewayne Bunting, MD Follow up.   Specialty: Cardiology Why: Office will call with date/time, If you dont hear from them,please give them a call Contact information: [address] [place] [address] [phone]                Allergies  Allergen Reactions   Vibra-Tab [Doxycycline] Nausea Only and Other (See Comments)    Triggered A-fib     Other Procedures/Studies: CT HEAD WO CONTRAST ( )  Result Date: 10/01/2022 CLINICAL DATA:  Stroke follow-up EXAM: CT HEAD WITHOUT CONTRAST TECHNIQUE:  Contiguous axial images were obtained from the base of the skull through the vertex without intravenous contrast. RADIATION DOSE REDUCTION: This exam was performed according to the departmental dose-optimization program which includes automated exposure control, adjustment of the mA and/or kV according to patient size and/or use of iterative reconstruction technique. COMPARISON:  Yesterday FINDINGS: Brain: Moderate area of cytotoxic edema in the left occipital lobe. No hemorrhage. 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 No hydrocephalus or masslike finding Vascular: No hyperdense vessel or unexpected calcification. Left-sided aneurysm clip. 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 Electronically Signed   By: Tiburcio Pea M.D.   On: 10/01/2022 10:22   ECHOCARDIOGRAM COMPLETE  Result Date: 09/30/2022    ECHOCARDIOGRAM REPORT   Patient Name:   VITORIA LOVORN [facility] Date of Exam: 09/30/2022 Medical Rec #:  [id number]        Height:       67.0 in Accession #:    [id number]       Weight:       140.0 lb Date of Birth:  07/03/1955         BSA:          1.738 m Patient Age:    67 years         BP:           106/62 mmHg Patient Gender: F                HR:           86 bpm. Exam Location:  Inpatient Procedure: 2D Echo, Color Doppler and Cardiac Doppler Indications:     TIA  History:         Patient has prior history of Echocardiogram examinations, most                  recent 12/22/2015. Risk Factors:Hypertension.  Sonographer:     Delcie Roch RDCS Referring Phys:  [id number] Glade Lloyd Diagnosing Phys: Epifanio Lesches MD IMPRESSIONS  1. Left ventricular ejection fraction, by estimation, is 60 to 65%. The left ventricle has normal function. The left ventricle has no regional wall motion abnormalities. Left ventricular diastolic parameters were normal.  2. Right ventricular systolic function  is normal. The right ventricular size is normal. There is normal pulmonary artery systolic pressure. The estimated right ventricular systolic pressure is 30.0 mmHg.  3. The mitral valve is normal in structure. Trivial mitral valve regurgitation.  4. The aortic valve is tricuspid. Aortic valve regurgitation is not visualized.  Aortic valve sclerosis is present, with no evidence of aortic valve stenosis.  5. The inferior vena cava is normal in size with greater than 50% respiratory variability, suggesting right atrial pressure of 3 mmHg. FINDINGS  Left Ventricle: Left ventricular ejection fraction, by estimation, is 60 to 65%. The left ventricle has normal function. The left ventricle has no regional wall motion abnormalities. The left ventricular internal cavity size was normal in size. There is  no left ventricular hypertrophy. Left ventricular diastolic parameters were normal. Right Ventricle: The right ventricular size is normal. No increase in right ventricular wall thickness. Right ventricular systolic function is normal. There is normal pulmonary artery systolic pressure. The tricuspid regurgitant velocity is 2.60 m/s, and  with an assumed right atrial pressure of 3 mmHg, the estimated right ventricular systolic pressure is 30.0 mmHg. Left Atrium: Left atrial size was normal in size. Right Atrium: Right atrial size was normal in size. Pericardium: There is no evidence of pericardial effusion. Mitral Valve: The mitral valve is normal in structure. Trivial mitral valve regurgitation. Tricuspid Valve: The tricuspid valve is normal in structure. Tricuspid valve regurgitation is trivial. Aortic Valve: The aortic valve is tricuspid. Aortic valve regurgitation is not visualized. Aortic valve sclerosis is present, with no evidence of aortic valve stenosis. Pulmonic Valve: The pulmonic valve was not well visualized. Pulmonic valve regurgitation is trivial. Aorta: The aortic root and ascending aorta are structurally  normal, with no evidence of dilitation. Venous: The inferior vena cava is normal in size with greater than 50% respiratory variability, suggesting right atrial pressure of 3 mmHg. IAS/Shunts: The interatrial septum was not well visualized.  LEFT VENTRICLE PLAX 2D LVIDd:         4.60 cm   Diastology LVIDs:         3.10 cm   LV e' medial:    6.96 cm/s LV PW:         1.00 cm   LV E/e' medial:  12.9 LV IVS:        0.90 cm   LV e' lateral:   11.40 cm/s LVOT diam:     1.90 cm   LV E/e' lateral: 7.9 LV SV:         64 LV SV Index:   37 LVOT Area:     2.84 cm  RIGHT VENTRICLE             IVC RV Basal diam:  2.50 cm     IVC diam: 1.40 cm RV S prime:     18.20 cm/s TAPSE (M-mode): 2.0 cm LEFT ATRIUM             Index        RIGHT ATRIUM           Index LA diam:        3.90 cm 2.24 cm/m   RA Area:     13.90 cm LA Vol (A2C):   46.9 ml 26.99 ml/m  RA Volume:   33.00 ml  18.99 ml/m LA Vol (A4C):   33.9 ml 19.51 ml/m LA Biplane Vol: 42.3 ml 24.34 ml/m  AORTIC VALVE LVOT Vmax:   123.00 cm/s LVOT Vmean:  82.400 cm/s LVOT VTI:    0.225 m  AORTA Ao Root diam: 3.10 cm Ao Asc diam:  3.60 cm MITRAL VALVE                TRICUSPID VALVE MV Area (PHT): 4.15 cm     TR Peak grad:  27.0 mmHg MV Decel Time: 183 msec     TR Vmax:        260.00 cm/s MV E velocity: 89.50 cm/s MV A velocity: 101.00 cm/s  SHUNTS MV E/A ratio:  0.89         Systemic VTI:  0.22 m                             Systemic Diam: 1.90 cm Epifanio Lesches MD Electronically signed by Epifanio Lesches MD Signature Date/Time: 09/30/2022/5:10:21 PM    Final (Updated)    CT HEAD CODE STROKE WO CONTRAST  Result Date: 09/30/2022 CLINICAL DATA:  Code stroke. Right eye vision loss and right-sided weakness. EXAM: CT ANGIOGRAPHY HEAD AND NECK TECHNIQUE: Multidetector CT imaging of the head and neck was performed using the standard protocol during bolus administration of intravenous contrast. Multiplanar CT image reconstructions and MIPs were obtained to evaluate the  vascular anatomy. Carotid stenosis measurements (when applicable) are obtained utilizing NASCET criteria, using the distal internal carotid diameter as the denominator. RADIATION DOSE REDUCTION: This exam was performed according to the departmental dose-optimization program which includes automated exposure control, adjustment of the mA and/or kV according to patient size and/or use of iterative reconstruction technique. CONTRAST:  60 cc Omnipaque 350 COMPARISON:  None Available. FINDINGS: CT HEAD FINDINGS Brain: There is no acute intracranial hemorrhage, extra-axial fluid collection, or acute infarct Parenchymal volume is normal. The ventricles are normal in size. Gray-white differentiation is preserved. Postsurgical changes reflecting left frontotemporal craniotomy for aneurysm clipping are noted. The pituitary and suprasellar region are normal. There is no mass lesion. There is no mass effect or midline shift. Vascular: A left paraclinoid ICA aneurysm is noted. No hyperdense vessel is seen. Skull: Postsurgical changes as above. There is no acute calvarial fracture. Sinuses/Orbits: There is chronic right sphenoid sinusitis with near-complete opacification. The globes and orbits are unremarkable. Other: None. ASPECTS [facility] Stroke Program Early CT Score) - Ganglionic level infarction (caudate, lentiform nuclei, internal capsule, insula, M1-M3 cortex): 7 - Supraganglionic infarction (M4-M6 cortex): 3 Total score (0-10 with 10 being normal): 10 CTA NECK FINDINGS Aortic arch: The imaged aortic arch is normal. The origin of the left subclavian artery is patent. The origins of the brachiocephalic and left common carotid artery are largely excluded from the field of view. The subclavian arteries are patent to the level imaged. Right carotid system: The right common, internal, and external carotid arteries are patent, without hemodynamically significant stenosis or occlusion. There is no evidence of dissection or  aneurysm. Left carotid system: The left common, internal, and external carotid arteries are patent, without hemodynamically significant stenosis or occlusion. There is no evidence of dissection or aneurysm. Vertebral arteries: The vertebral arteries are patent, without hemodynamically significant stenosis or occlusion there is no evidence of dissection or aneurysm. Skeleton: There is no acute osseous abnormality or suspicious osseous lesion. There is grade 1 anterolisthesis of C3 on C4 with reversal of the normal cervical curvature centered at C5. 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 There is no visible canal hematoma. Other neck: The soft tissues of the neck are unremarkable. Upper chest: The imaged lung apices are clear. Review of the MIP images confirms the above findings CTA HEAD FINDINGS Anterior circulation: Streak artifact from the aneurysm clip partially obscures portions of the distal left intracranial ICA and proximal left MCA. Within this confine: The intracranial ICAs  are patent with minimal calcified plaque on the left but no significant stenosis or occlusion. The bilateral MCAs are patent, without proximal stenosis or occlusion. The bilateral ACAs are patent, without proximal stenosis or occlusion. The patient is status post clipping of the left paraclinoid aneurysm. There is no definite residual or recurrent aneurysm, though evaluation is degraded by streak artifact. There is no other aneurysm. There is no AVM. Posterior circulation: The bilateral V4 segments are patent. The basilar artery is patent. The major cerebellar arteries appear patent. The bilateral PCAs are patent, without proximal stenosis or occlusion. There is no aneurysm or AVM. Venous sinuses: Patent. Anatomic variants: None. Review of the MIP images confirms the above findings IMPRESSION: 1. No acute intracranial pathology. 2. No emergent vascular finding. Patent  vasculature of the head and neck with no hemodynamically significant stenosis, occlusion, or dissection. 3. Status post clipping of the left paraclinoid aneurysm without definite residual aneurysm, though evaluation is degraded by streak artifact. 4. Chronic right sphenoid sinusitis. Findings communicated to Dr Iver Nestle at 11:28 am via AMION. Electronically Signed   By: Lesia Hausen M.D.   On: 09/30/2022 11:51   CT ANGIO HEAD NECK W WO CM (CODE STROKE)  Result Date: 09/30/2022 CLINICAL DATA:  Code stroke. Right eye vision loss and right-sided weakness. EXAM: CT ANGIOGRAPHY HEAD AND NECK TECHNIQUE: Multidetector CT imaging of the head and neck was performed using the standard protocol during bolus administration of intravenous contrast. Multiplanar CT image reconstructions and MIPs were obtained to evaluate the vascular anatomy. Carotid stenosis measurements (when applicable) are obtained utilizing NASCET criteria, using the distal internal carotid diameter as the denominator. RADIATION DOSE REDUCTION: This exam was performed according to the departmental dose-optimization program which includes automated exposure control, adjustment of the mA and/or kV according to patient size and/or use of iterative reconstruction technique. CONTRAST:  60 cc Omnipaque 350 COMPARISON:  None Available. FINDINGS: CT HEAD FINDINGS Brain: There is no acute intracranial hemorrhage, extra-axial fluid collection, or acute infarct Parenchymal volume is normal. The ventricles are normal in size. Gray-white differentiation is preserved. Postsurgical changes reflecting left frontotemporal craniotomy for aneurysm clipping are noted. The pituitary and suprasellar region are normal. There is no mass lesion. There is no mass effect or midline shift. Vascular: A left paraclinoid ICA aneurysm is noted. No hyperdense vessel is seen. Skull: Postsurgical changes as above. There is no acute calvarial fracture. Sinuses/Orbits: There is chronic right  sphenoid sinusitis with near-complete opacification. The globes and orbits are unremarkable. Other: None. ASPECTS [facility] Stroke Program Early CT Score) - Ganglionic level infarction (caudate, lentiform nuclei, internal capsule, insula, M1-M3 cortex): 7 - Supraganglionic infarction (M4-M6 cortex): 3 Total score (0-10 with 10 being normal): 10 CTA NECK FINDINGS Aortic arch: The imaged aortic arch is normal. The origin of the left subclavian artery is patent. The origins of the brachiocephalic and left common carotid artery are largely excluded from the field of view. The subclavian arteries are patent to the level imaged. Right carotid system: The right common, internal, and external carotid arteries are patent, without hemodynamically significant stenosis or occlusion. There is no evidence of dissection or aneurysm. Left carotid system: The left common, internal, and external carotid arteries are patent, without hemodynamically significant stenosis or occlusion. There is no evidence of dissection or aneurysm. Vertebral arteries: The vertebral arteries are patent, without hemodynamically significant stenosis or occlusion there is no evidence of dissection or aneurysm. Skeleton: There is no acute osseous abnormality or suspicious osseous lesion. There is  grade 1 anterolisthesis of C3 on C4 with reversal of the normal cervical curvature centered at C5. 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 There is no visible canal hematoma. Other neck: The soft tissues of the neck are unremarkable. Upper chest: The imaged lung apices are clear. Review of the MIP images confirms the above findings CTA HEAD FINDINGS Anterior circulation: Streak artifact from the aneurysm clip partially obscures portions of the distal left intracranial ICA and proximal left MCA. Within this confine: The intracranial ICAs are patent with minimal calcified plaque on the left but no  significant stenosis or occlusion. The bilateral MCAs are patent, without proximal stenosis or occlusion. The bilateral ACAs are patent, without proximal stenosis or occlusion. The patient is status post clipping of the left paraclinoid aneurysm. There is no definite residual or recurrent aneurysm, though evaluation is degraded by streak artifact. There is no other aneurysm. There is no AVM. Posterior circulation: The bilateral V4 segments are patent. The basilar artery is patent. The major cerebellar arteries appear patent. The bilateral PCAs are patent, without proximal stenosis or occlusion. There is no aneurysm or AVM. Venous sinuses: Patent. Anatomic variants: None. Review of the MIP images confirms the above findings IMPRESSION: 1. No acute intracranial pathology. 2. No emergent vascular finding. Patent vasculature of the head and neck with no hemodynamically significant stenosis, occlusion, or dissection. 3. Status post clipping of the left paraclinoid aneurysm without definite residual aneurysm, though evaluation is degraded by streak artifact. 4. Chronic right sphenoid sinusitis. Findings communicated to Dr Iver Nestle at 11:28 am via AMION. Electronically Signed   By: Lesia Hausen M.D.   On: 09/30/2022 11:51     TODAY-DAY OF DISCHARGE:  Subjective:   Levie Heritage today has no headache,no chest abdominal pain,no new weakness tingling or numbness, feels much better wants to go home today.   Objective:   Blood pressure 112/64, pulse 77, temperature 99.1 F (37.3 C), temperature source Oral, resp. rate 16, height 5\' 7"  (1.702 m), weight 63.5 kg, SpO2 94 %. No intake or output data in the 24 hours ending 10/01/22 1242 Filed Weights   09/30/22 1102  Weight: 63.5 kg    Exam: Awake Alert, Oriented *3, No new F.N deficits, Normal affect [place].AT,PERRAL Supple Neck,No JVD, No cervical lymphadenopathy appriciated.  Symmetrical Chest wall movement, Good air movement bilaterally, CTAB RRR,No  Gallops,Rubs or new Murmurs, No Parasternal Heave +ve B.Sounds, Abd Soft, Non tender, No organomegaly appriciated, No rebound -guarding or rigidity. No Cyanosis, Clubbing or edema, No new Rash or bruise   PERTINENT RADIOLOGIC STUDIES: CT HEAD WO CONTRAST ( )  Result Date: 10/01/2022 CLINICAL DATA:  Stroke follow-up EXAM: CT HEAD WITHOUT CONTRAST TECHNIQUE: Contiguous axial images were obtained from the base of the skull through the vertex without intravenous contrast. RADIATION DOSE REDUCTION: This exam was performed according to the departmental dose-optimization program which includes automated exposure control, adjustment of the mA and/or kV according to patient size and/or use of iterative reconstruction technique. COMPARISON:  Yesterday FINDINGS: Brain: Moderate area of cytotoxic edema in the left occipital lobe. No hemorrhage. 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 No hydrocephalus or masslike finding Vascular: No hyperdense vessel or unexpected calcification. Left-sided aneurysm clip. 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 Electronically Signed   By: Tiburcio Pea M.D.   On: 10/01/2022 10:22   ECHOCARDIOGRAM COMPLETE  Result Date: 09/30/2022    ECHOCARDIOGRAM  REPORT   Patient Name:   JALEISA BHAT [facility] Date of Exam: 09/30/2022 Medical Rec #:  [id number]        Height:       67.0 in Accession #:    [id number]       Weight:       140.0 lb Date of Birth:  1956-01-09         BSA:          1.738 m Patient Age:    67 years         BP:           106/62 mmHg Patient Gender: F                HR:           86 bpm. Exam Location:  Inpatient Procedure: 2D Echo, Color Doppler and Cardiac Doppler Indications:     TIA  History:         Patient has prior history of Echocardiogram examinations, most                  recent 12/22/2015. Risk Factors:Hypertension.  Sonographer:     Delcie Roch RDCS Referring Phys:  [id number] Glade Lloyd Diagnosing Phys: Epifanio Lesches MD IMPRESSIONS  1. Left ventricular ejection fraction, by estimation, is 60 to 65%. The left ventricle has normal function. The left ventricle has no regional wall motion abnormalities. Left ventricular diastolic parameters were normal.  2. Right ventricular systolic function is normal. The right ventricular size is normal. There is normal pulmonary artery systolic pressure. The estimated right ventricular systolic pressure is 30.0 mmHg.  3. The mitral valve is normal in structure. Trivial mitral valve regurgitation.  4. The aortic valve is tricuspid. Aortic valve regurgitation is not visualized. Aortic valve sclerosis is present, with no evidence of aortic valve stenosis.  5. The inferior vena cava is normal in size with greater than 50% respiratory variability, suggesting right atrial pressure of 3 mmHg. FINDINGS  Left Ventricle: Left ventricular ejection fraction, by estimation, is 60 to 65%. The left ventricle has normal function. The left ventricle has no regional wall motion abnormalities. The left ventricular internal cavity size was normal in size. There is  no left ventricular hypertrophy. Left ventricular diastolic parameters were normal. Right Ventricle: The right ventricular size is normal. No increase in right ventricular wall thickness. Right ventricular systolic function is normal. There is normal pulmonary artery systolic pressure. The tricuspid regurgitant velocity is 2.60 m/s, and  with an assumed right atrial pressure of 3 mmHg, the estimated right ventricular systolic pressure is 30.0 mmHg. Left Atrium: Left atrial size was normal in size. Right Atrium: Right atrial size was normal in size. Pericardium: There is no evidence of pericardial effusion. Mitral Valve: The mitral valve is normal in structure. Trivial mitral valve regurgitation. Tricuspid Valve: The tricuspid valve is normal in structure.  Tricuspid valve regurgitation is trivial. Aortic Valve: The aortic valve is tricuspid. Aortic valve regurgitation is not visualized. Aortic valve sclerosis is present, with no evidence of aortic valve stenosis. Pulmonic Valve: The pulmonic valve was not well visualized. Pulmonic valve regurgitation is trivial. Aorta: The aortic root and ascending aorta are structurally normal, with no evidence of dilitation. Venous: The inferior vena cava is normal in size with greater than 50% respiratory variability, suggesting right atrial pressure of 3 mmHg. IAS/Shunts: The interatrial septum was not well visualized.  LEFT VENTRICLE PLAX 2D LVIDd:  4.60 cm   Diastology LVIDs:         3.10 cm   LV e' medial:    6.96 cm/s LV PW:         1.00 cm   LV E/e' medial:  12.9 LV IVS:        0.90 cm   LV e' lateral:   11.40 cm/s LVOT diam:     1.90 cm   LV E/e' lateral: 7.9 LV SV:         64 LV SV Index:   37 LVOT Area:     2.84 cm  RIGHT VENTRICLE             IVC RV Basal diam:  2.50 cm     IVC diam: 1.40 cm RV S prime:     18.20 cm/s TAPSE (M-mode): 2.0 cm LEFT ATRIUM             Index        RIGHT ATRIUM           Index LA diam:        3.90 cm 2.24 cm/m   RA Area:     13.90 cm LA Vol (A2C):   46.9 ml 26.99 ml/m  RA Volume:   33.00 ml  18.99 ml/m LA Vol (A4C):   33.9 ml 19.51 ml/m LA Biplane Vol: 42.3 ml 24.34 ml/m  AORTIC VALVE LVOT Vmax:   123.00 cm/s LVOT Vmean:  82.400 cm/s LVOT VTI:    0.225 m  AORTA Ao Root diam: 3.10 cm Ao Asc diam:  3.60 cm MITRAL VALVE                TRICUSPID VALVE MV Area (PHT): 4.15 cm     TR Peak grad:   27.0 mmHg MV Decel Time: 183 msec     TR Vmax:        260.00 cm/s MV E velocity: 89.50 cm/s MV A velocity: 101.00 cm/s  SHUNTS MV E/A ratio:  0.89         Systemic VTI:  0.22 m                             Systemic Diam: 1.90 cm Epifanio Lesches MD Electronically signed by Epifanio Lesches MD Signature Date/Time: 09/30/2022/5:10:21 PM    Final (Updated)    CT HEAD CODE STROKE WO  CONTRAST  Result Date: 09/30/2022 CLINICAL DATA:  Code stroke. Right eye vision loss and right-sided weakness. EXAM: CT ANGIOGRAPHY HEAD AND NECK TECHNIQUE: Multidetector CT imaging of the head and neck was performed using the standard protocol during bolus administration of intravenous contrast. Multiplanar CT image reconstructions and MIPs were obtained to evaluate the vascular anatomy. Carotid stenosis measurements (when applicable) are obtained utilizing NASCET criteria, using the distal internal carotid diameter as the denominator. RADIATION DOSE REDUCTION: This exam was performed according to the departmental dose-optimization program which includes automated exposure control, adjustment of the mA and/or kV according to patient size and/or use of iterative reconstruction technique. CONTRAST:  60 cc Omnipaque 350 COMPARISON:  None Available. FINDINGS: CT HEAD FINDINGS Brain: There is no acute intracranial hemorrhage, extra-axial fluid collection, or acute infarct Parenchymal volume is normal. The ventricles are normal in size. Gray-white differentiation is preserved. Postsurgical changes reflecting left frontotemporal craniotomy for aneurysm clipping are noted. The pituitary and suprasellar region are normal. There is no mass lesion. There is no mass effect or midline shift.  Vascular: A left paraclinoid ICA aneurysm is noted. No hyperdense vessel is seen. Skull: Postsurgical changes as above. There is no acute calvarial fracture. Sinuses/Orbits: There is chronic right sphenoid sinusitis with near-complete opacification. The globes and orbits are unremarkable. Other: None. ASPECTS [facility] Stroke Program Early CT Score) - Ganglionic level infarction (caudate, lentiform nuclei, internal capsule, insula, M1-M3 cortex): 7 - Supraganglionic infarction (M4-M6 cortex): 3 Total score (0-10 with 10 being normal): 10 CTA NECK FINDINGS Aortic arch: The imaged aortic arch is normal. The origin of the left subclavian  artery is patent. The origins of the brachiocephalic and left common carotid artery are largely excluded from the field of view. The subclavian arteries are patent to the level imaged. Right carotid system: The right common, internal, and external carotid arteries are patent, without hemodynamically significant stenosis or occlusion. There is no evidence of dissection or aneurysm. Left carotid system: The left common, internal, and external carotid arteries are patent, without hemodynamically significant stenosis or occlusion. There is no evidence of dissection or aneurysm. Vertebral arteries: The vertebral arteries are patent, without hemodynamically significant stenosis or occlusion there is no evidence of dissection or aneurysm. Skeleton: There is no acute osseous abnormality or suspicious osseous lesion. There is grade 1 anterolisthesis of C3 on C4 with reversal of the normal cervical curvature centered at C5. 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 There is no visible canal hematoma. Other neck: The soft tissues of the neck are unremarkable. Upper chest: The imaged lung apices are clear. Review of the MIP images confirms the above findings CTA HEAD FINDINGS Anterior circulation: Streak artifact from the aneurysm clip partially obscures portions of the distal left intracranial ICA and proximal left MCA. Within this confine: The intracranial ICAs are patent with minimal calcified plaque on the left but no significant stenosis or occlusion. The bilateral MCAs are patent, without proximal stenosis or occlusion. The bilateral ACAs are patent, without proximal stenosis or occlusion. The patient is status post clipping of the left paraclinoid aneurysm. There is no definite residual or recurrent aneurysm, though evaluation is degraded by streak artifact. There is no other aneurysm. There is no AVM. Posterior circulation: The bilateral V4 segments are  patent. The basilar artery is patent. The major cerebellar arteries appear patent. The bilateral PCAs are patent, without proximal stenosis or occlusion. There is no aneurysm or AVM. Venous sinuses: Patent. Anatomic variants: None. Review of the MIP images confirms the above findings IMPRESSION: 1. No acute intracranial pathology. 2. No emergent vascular finding. Patent vasculature of the head and neck with no hemodynamically significant stenosis, occlusion, or dissection. 3. Status post clipping of the left paraclinoid aneurysm without definite residual aneurysm, though evaluation is degraded by streak artifact. 4. Chronic right sphenoid sinusitis. Findings communicated to Dr Iver Nestle at 11:28 am via AMION. Electronically Signed   By: Lesia Hausen M.D.   On: 09/30/2022 11:51   CT ANGIO HEAD NECK W WO CM (CODE STROKE)  Result Date: 09/30/2022 CLINICAL DATA:  Code stroke. Right eye vision loss and right-sided weakness. EXAM: CT ANGIOGRAPHY HEAD AND NECK TECHNIQUE: Multidetector CT imaging of the head and neck was performed using the standard protocol during bolus administration of intravenous contrast. Multiplanar CT image reconstructions and MIPs were obtained to evaluate the vascular anatomy. Carotid stenosis measurements (when applicable) are obtained utilizing NASCET criteria, using the distal internal carotid diameter as the denominator. RADIATION DOSE REDUCTION: This exam was performed according to the departmental  dose-optimization program which includes automated exposure control, adjustment of the mA and/or kV according to patient size and/or use of iterative reconstruction technique. CONTRAST:  60 cc Omnipaque 350 COMPARISON:  None Available. FINDINGS: CT HEAD FINDINGS Brain: There is no acute intracranial hemorrhage, extra-axial fluid collection, or acute infarct Parenchymal volume is normal. The ventricles are normal in size. Gray-white differentiation is preserved. Postsurgical changes reflecting left  frontotemporal craniotomy for aneurysm clipping are noted. The pituitary and suprasellar region are normal. There is no mass lesion. There is no mass effect or midline shift. Vascular: A left paraclinoid ICA aneurysm is noted. No hyperdense vessel is seen. Skull: Postsurgical changes as above. There is no acute calvarial fracture. Sinuses/Orbits: There is chronic right sphenoid sinusitis with near-complete opacification. The globes and orbits are unremarkable. Other: None. ASPECTS [facility] Stroke Program Early CT Score) - Ganglionic level infarction (caudate, lentiform nuclei, internal capsule, insula, M1-M3 cortex): 7 - Supraganglionic infarction (M4-M6 cortex): 3 Total score (0-10 with 10 being normal): 10 CTA NECK FINDINGS Aortic arch: The imaged aortic arch is normal. The origin of the left subclavian artery is patent. The origins of the brachiocephalic and left common carotid artery are largely excluded from the field of view. The subclavian arteries are patent to the level imaged. Right carotid system: The right common, internal, and external carotid arteries are patent, without hemodynamically significant stenosis or occlusion. There is no evidence of dissection or aneurysm. Left carotid system: The left common, internal, and external carotid arteries are patent, without hemodynamically significant stenosis or occlusion. There is no evidence of dissection or aneurysm. Vertebral arteries: The vertebral arteries are patent, without hemodynamically significant stenosis or occlusion there is no evidence of dissection or aneurysm. Skeleton: There is no acute osseous abnormality or suspicious osseous lesion. There is grade 1 anterolisthesis of C3 on C4 with reversal of the normal cervical curvature centered at C5. 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 There is no visible canal hematoma. Other neck: The soft tissues of the neck are unremarkable.  Upper chest: The imaged lung apices are clear. Review of the MIP images confirms the above findings CTA HEAD FINDINGS Anterior circulation: Streak artifact from the aneurysm clip partially obscures portions of the distal left intracranial ICA and proximal left MCA. Within this confine: The intracranial ICAs are patent with minimal calcified plaque on the left but no significant stenosis or occlusion. The bilateral MCAs are patent, without proximal stenosis or occlusion. The bilateral ACAs are patent, without proximal stenosis or occlusion. The patient is status post clipping of the left paraclinoid aneurysm. There is no definite residual or recurrent aneurysm, though evaluation is degraded by streak artifact. There is no other aneurysm. There is no AVM. Posterior circulation: The bilateral V4 segments are patent. The basilar artery is patent. The major cerebellar arteries appear patent. The bilateral PCAs are patent, without proximal stenosis or occlusion. There is no aneurysm or AVM. Venous sinuses: Patent. Anatomic variants: None. Review of the MIP images confirms the above findings IMPRESSION: 1. No acute intracranial pathology. 2. No emergent vascular finding. Patent vasculature of the head and neck with no hemodynamically significant stenosis, occlusion, or dissection. 3. Status post clipping of the left paraclinoid aneurysm without definite residual aneurysm, though evaluation is degraded by streak artifact. 4. Chronic right sphenoid sinusitis. Findings communicated to Dr Iver Nestle at 11:28 am via AMION. Electronically Signed   By: Lesia Hausen M.D.   On: 09/30/2022 11:51  PERTINENT LAB RESULTS: CBC: Recent Labs    09/30/22 1117 09/30/22 1127  WBC 9.6  --   HGB 12.7 13.6  HCT 39.5 40.0  PLT 228  --    CMET CMP     Component Value Date/Time   NA 136 10/01/2022 0308   K 3.3 (L) 10/01/2022 0308   CL 107 10/01/2022 0308   CO2 21 (L) 10/01/2022 0308   GLUCOSE 97 10/01/2022 0308   BUN 15  10/01/2022 0308   CREATININE 1.00 10/01/2022 0308   CREATININE 0.97 12/16/2019 1032   CALCIUM 8.8 (L) 10/01/2022 0308   PROT 7.7 10/01/2022 0308   ALBUMIN 3.1 (L) 10/01/2022 0308   AST 16 10/01/2022 0308   ALT 16 10/01/2022 0308   ALKPHOS 64 10/01/2022 0308   BILITOT 0.9 10/01/2022 0308   GFRNONAA >60 10/01/2022 0308   GFRAA >60 12/22/2015 0347    GFR Estimated Creatinine Clearance: 53.8 mL/min (by C-G formula based on SCr of 1 mg/dL). No results for input(s): "LIPASE", "AMYLASE" in the last 72 hours. No results for input(s): "CKTOTAL", "CKMB", "CKMBINDEX", "TROPONINI" in the last 72 hours. Invalid input(s): "POCBNP" No results for input(s): "DDIMER" in the last 72 hours. Recent Labs    10/01/22 0308  HGBA1C 5.2   Recent Labs    10/01/22 0308  CHOL 105  HDL 38*  LDLCALC 52  TRIG 77  CHOLHDL 2.8   No results for input(s): "TSH", "T4TOTAL", "T3FREE", "THYROIDAB" in the last 72 hours.  Invalid input(s): "FREET3" No results for input(s): "VITAMINB12", "FOLATE", "FERRITIN", "TIBC", "IRON", "RETICCTPCT" in the last 72 hours. Coags: Recent Labs    09/30/22 1117  INR 1.2   Microbiology: No results found for this or any previous visit (from the past 240 hour(s)).  FURTHER DISCHARGE INSTRUCTIONS:  Get Medicines reviewed and adjusted: Please take all your medications with you for your next visit with your Primary MD  Laboratory/radiological data: Please request your Primary MD to go over all hospital tests and procedure/radiological results at the follow up, please ask your Primary MD to get all Hospital records sent to his/her office.  In some cases, they will be blood work, cultures and biopsy results pending at the time of your discharge. Please request that your primary care M.D. goes through all the records of your hospital data and follows up on these results.  Also Note the following: If you experience worsening of your admission symptoms, develop shortness of  breath, life threatening emergency, suicidal or homicidal thoughts you must seek medical attention immediately by calling 911 or calling your MD immediately  if symptoms less severe.  You must read complete instructions/literature along with all the possible adverse reactions/side effects for all the Medicines you take and that have been prescribed to you. Take any new Medicines after you have completely understood and accpet all the possible adverse reactions/side effects.   Do not drive when taking Pain medications or sleeping medications (Benzodaizepines)  Do not take more than prescribed Pain, Sleep and Anxiety Medications. It is not advisable to combine anxiety,sleep and pain medications without talking with your primary care practitioner  Special Instructions: If you have smoked or chewed Tobacco  in the last 2 yrs please stop smoking, stop any regular Alcohol  and or any Recreational drug use.  Wear Seat belts while driving.  Please note: You were cared for by a hospitalist during your hospital stay. Once you are discharged, your primary care physician will handle any further medical issues. Please note  that NO REFILLS for any discharge medications will be authorized once you are discharged, as it is imperative that you return to your primary care physician (or establish a relationship with a primary care physician if you do not have one) for your post hospital discharge needs so that they can reassess your need for medications and monitor your lab values.  Total Time spent coordinating discharge including counseling, education and face to face time equals greater than 30 minutes.  SignedJeoffrey Massed 10/01/2022 12:42 PM

## 2022-10-01 NOTE — Evaluation (Addendum)
Occupational Therapy Evaluation Patient Details Name: [name] MRN: [id number] DOB: 1955/12/23 Today's Date: 10/01/2022   History of Present Illness Pt is a 67 yr old female who presented due to blurred vision. CT negative. Pending repeat CT. 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 Pt at this time reported when watching TV that the center of people's eyes "are gone or blank". She reported this was also occurring when looking at herself in a mirror. Pt reported at baseline they use drops to decrease dryness, wear glasses and has blurred vision on L eye even with glasses on. Pt when covering the the lateral portion of R eye reported possible increase in vision for distance vision. Pt also noted when reading one line at a time in a paragraph noted to have decrease in ability to scan page and noted eyes and head moving to locate spot on page. However, when given one line at a time there was a significant increase in ability to visually scan/read. Pt was given strategies in session about how to visually scan items and vision fatigue but plan to continue to follow with Acute Occupational Therapy and was educated about OP low vision.      Recommendations for follow up therapy are one component of a multi-disciplinary discharge planning process, led by the attending physician.  Recommendations may be updated based on patient status, additional functional criteria and insurance authorization.   Assistance Recommended at Discharge Intermittent Supervision/Assistance  Patient can return home with the following Assist for transportation (pt was educated about allowing neurology clearing pt to drive)     Functional Status Assessment  Patient has had a recent decline in their functional status and demonstrates the ability  to make significant improvements in function in a reasonable and predictable amount of time.  Equipment Recommendations  None recommended by OT    Recommendations for Other Services       Precautions / Restrictions Precautions Precautions: Fall Precaution Comments: hx OA and limited ambulation Restrictions Weight Bearing Restrictions: No      Mobility Bed Mobility Overal bed mobility: Independent                  Transfers Overall transfer level: Modified independent                 General transfer comment: increase time due to stiffness with OA      Balance Overall balance assessment: Needs assistance Sitting-balance support: Feet supported Sitting balance-Leahy Scale: Good     Standing balance support: Single extremity supported Standing balance-Leahy Scale: Good                             ADL either performed or assessed with clinical judgement   ADL Overall ADL's : Needs assistance/impaired Eating/Feeding: Independent;Sitting   Grooming: Wash/dry hands;Wash/dry face;Modified independent;Standing   Upper Body Bathing: Modified independent;Sitting   Lower Body Bathing: Modified independent;Sit to/from stand   Upper Body Dressing : Modified independent   Lower Body Dressing: Modified independent;Sit to/from stand Lower Body Dressing Details (indicate cue type and reason): uses handles on bed to prop up LB Toilet Transfer: Supervision/safety   Toileting- Clothing Manipulation and Hygiene: Supervision/safety;Sit to/from stand   Tub/ Shower Transfer: Supervision/safety;Cueing for safety;Cueing for sequencing  Functional mobility during ADLs: Supervision/safety       Vision Baseline Vision/History: 1 Wears glasses Ability to See in Adequate Light: 1 Impaired Patient Visual Report: Peripheral vision impairment Vision Assessment?: Vision impaired- to be further tested in functional context Additional Comments: Pt reported that  when looking at herself in a mirror or people on the TV they have "no eyes or it is balnk area". However, when looking straight onto an object it is more the right periphal that is blurred. Pt also reported at baseline some blurring in left eye, requires eye drops due to dryness and wears glasses. Pt when covering R perpherial reported that there vision increase in clarity. Pt when in session when looking a self in the mirror with and without glasses could now see their eyes and no longer a blank space. Pt when ambulating also noted to not have any difficulties with depth perception.     Perception     Praxis      Pertinent Vitals/Pain Pain Assessment Pain Assessment: 0-10 Pain Location: B hips due to OA onfirst 3-4 steps Pain Descriptors / Indicators: Discomfort Pain Intervention(s): Limited activity within patient's tolerance, Monitored during session     Hand Dominance Right   Extremity/Trunk Assessment Upper Extremity Assessment Upper Extremity Assessment: Overall WFL for tasks assessed   Lower Extremity Assessment Lower Extremity Assessment: Defer to PT evaluation   Cervical / Trunk Assessment Cervical / Trunk Assessment: Kyphotic   Communication Communication Communication: No difficulties   Cognition Arousal/Alertness: Awake/alert Behavior During Therapy: WFL for tasks assessed/performed Overall Cognitive Status: Within Functional Limits for tasks assessed                                       General Comments       Exercises     Shoulder Instructions      Home Living Family/patient expects to be discharged to:: Private residence Living Arrangements: Alone Available Help at Discharge: Family;Available PRN/intermittently Type of Home: Apartment Home Access: Level entry     Home Layout: One level     Bathroom Shower/Tub: Chief Strategy Officer: Standard     Home Equipment: None   Additional Comments: family can be full time  if warranted  Lives With: Alone    Prior Functioning/Environment Prior Level of Function : Independent/Modified Independent;Working/employed;Driving             Mobility Comments: independent, works as Air traffic controller at [employer] and reports this requires to be on the computer most of the day          OT Problem List: Decreased strength;Decreased activity tolerance;Impaired balance (sitting and/or standing);Decreased safety awareness      OT Treatment/Interventions: Self-care/ADL training;Patient/family education    OT Goals(Current goals can be found in the care plan section) Acute Rehab OT Goals Patient Stated Goal: to be able to return to work OT Goal Formulation: With patient Time For Goal Achievement: 10/15/22 Potential to Achieve Goals: Good ADL Goals Additional ADL Goal #1: Pt will be able to report 2 stratigies to decrease visual fatigue Additional ADL Goal #2: Pt will be able to use visual stratigies to be able to read/scan menu  OT Frequency: Min 2X/week    Co-evaluation              AM-PAC OT "6 Clicks" Daily Activity     Outcome Measure Help from another person  eating meals?: None Help from another person taking care of personal grooming?: None Help from another person toileting, which includes using toliet, bedpan, or urinal?: None Help from another person bathing (including washing, rinsing, drying)?: A Little Help from another person to put on and taking off regular upper body clothing?: None Help from another person to put on and taking off regular lower body clothing?: None 6 Click Score: 23   End of Session Nurse Communication:  (vision)  Activity Tolerance: Patient tolerated treatment well Patient left: in bed;with call bell/phone within reach;with family/visitor present  OT Visit Diagnosis: Low vision, both eyes (H54.2);Other abnormalities of gait and mobility (R26.89);Muscle weakness (generalized) (M62.81)                Time:  1610-9604 OT Time Calculation (min): 35 min Charges:  OT General Charges $OT Visit: 1 Visit OT Evaluation $OT Eval Low Complexity: 1 Low OT Treatments $Self Care/Home Management : 8-22 mins  Alphia Moh OTR/L  Acute Rehab Services  [phone] office number    Alphia Moh 10/01/2022, 12:32 PM

## 2022-10-01 NOTE — Progress Notes (Addendum)
STROKE TEAM PROGRESS NOTE   INTERVAL HISTORY Patient is sitting in the bed in no apparent distress.  States that she had not been feeling well this past week and on Friday while at work she was not able to see on the right side peripheral vision.  She states for the most part it has cleared up however still having a little trouble seeing on the right side.  At the time of vision loss she states she had a slight headache across her forehead.  She denies weakness,  numbness, slurred speech Patient unable to have MRI due to incompatible stent repeat CT head this morning with acute left occipital infarct  Vitals:   10/01/22 0742 10/01/22 0743 10/01/22 0744 10/01/22 0745  BP:   112/64 112/64  Pulse: 79 82 77 77  Resp: 14 18 19 16   Temp:    98.5 F (36.9 C)  TempSrc:    Oral  SpO2: 94% 94% 94% 94%  Weight:      Height:       CBC:  Recent Labs  Lab 09/30/22 1117 09/30/22 1127  WBC 9.6  --   NEUTROABS 7.8*  --   HGB 12.7 13.6  HCT 39.5 40.0  MCV 90.8  --   PLT 228  --    Basic Metabolic Panel:  Recent Labs  Lab 09/30/22 1117 09/30/22 1127 10/01/22 0308  NA 136 138 136  K 3.0* 3.1* 3.3*  CL 105 107 107  CO2 21*  --  21*  GLUCOSE 126* 126* 97  BUN 17 19 15   CREATININE 1.15* 1.10* 1.00  CALCIUM 8.9  --  8.8*  MG 2.0  --  1.9   Lipid Panel:  Recent Labs  Lab 10/01/22 0308  CHOL 105  TRIG 77  HDL 38*  CHOLHDL 2.8  VLDL 15  LDLCALC 52   HgbA1c:  Recent Labs  Lab 10/01/22 0308  HGBA1C 5.2   Urine Drug Screen:  Recent Labs  Lab 10/01/22 0421  LABOPIA NONE DETECTED  COCAINSCRNUR NONE DETECTED  LABBENZ NONE DETECTED  AMPHETMU NONE DETECTED  THCU NONE DETECTED  LABBARB NONE DETECTED    Alcohol Level  Recent Labs  Lab 09/30/22 1117  ETH <10    IMAGING past 24 hours CT HEAD WO CONTRAST ( )  Result Date: 10/01/2022 CLINICAL DATA:  Stroke follow-up EXAM: CT HEAD WITHOUT CONTRAST TECHNIQUE: Contiguous axial images were obtained from the base of the skull  through the vertex without intravenous contrast. RADIATION DOSE REDUCTION: This exam was performed according to the departmental dose-optimization program which includes automated exposure control, adjustment of the mA and/or kV according to patient size and/or use of iterative reconstruction technique. COMPARISON:  Yesterday FINDINGS: Brain: Moderate area of cytotoxic edema in the left occipital lobe. No hemorrhage. 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 No hydrocephalus or masslike finding Vascular: No hyperdense vessel or unexpected calcification. Left-sided aneurysm clip. 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 Electronically Signed   By: Tiburcio Pea M.D.   On: 10/01/2022 10:22   ECHOCARDIOGRAM COMPLETE  Result Date: 09/30/2022    ECHOCARDIOGRAM REPORT   Patient Name:   [name] [facility] Date of Exam: 09/30/2022 Medical Rec #:  [id number]        Height:       67.0 in Accession #:    [id number]       Weight:       140.0 lb Date of Birth:  November 26, 1955         BSA:          1.738 m Patient Age:    67 years         BP:           106/62 mmHg Patient Gender: F                HR:           86 bpm. Exam Location:  Inpatient Procedure: 2D Echo, Color Doppler and Cardiac Doppler Indications:     TIA  History:         Patient has prior history of Echocardiogram examinations, most                  recent 12/22/2015. Risk Factors:Hypertension.  Sonographer:     Delcie Roch RDCS Referring Phys:  [id number] Glade Lloyd Diagnosing Phys: Epifanio Lesches MD IMPRESSIONS  1. Left ventricular ejection fraction, by estimation, is 60 to 65%. The left ventricle has normal function. The left ventricle has no regional wall motion abnormalities. Left ventricular diastolic parameters were normal.  2. Right ventricular systolic function is normal. The right ventricular size is normal. There is normal  pulmonary artery systolic pressure. The estimated right ventricular systolic pressure is 30.0 mmHg.  3. The mitral valve is normal in structure. Trivial mitral valve regurgitation.  4. The aortic valve is tricuspid. Aortic valve regurgitation is not visualized. Aortic valve sclerosis is present, with no evidence of aortic valve stenosis.  5. The inferior vena cava is normal in size with greater than 50% respiratory variability, suggesting right atrial pressure of 3 mmHg. FINDINGS  Left Ventricle: Left ventricular ejection fraction, by estimation, is 60 to 65%. The left ventricle has normal function. The left ventricle has no regional wall motion abnormalities. The left ventricular internal cavity size was normal in size. There is  no left ventricular hypertrophy. Left ventricular diastolic parameters were normal. Right Ventricle: The right ventricular size is normal. No increase in right ventricular wall thickness. Right ventricular systolic function is normal. There is normal pulmonary artery systolic pressure. The tricuspid regurgitant velocity is 2.60 m/s, and  with an assumed right atrial pressure of 3 mmHg, the estimated right ventricular systolic pressure is 30.0 mmHg. Left Atrium: Left atrial size was normal in size. Right Atrium: Right atrial size was normal in size. Pericardium: There is no evidence of pericardial effusion. Mitral Valve: The mitral valve is normal in structure. Trivial mitral valve regurgitation. Tricuspid Valve: The tricuspid valve is normal in structure. Tricuspid valve regurgitation is trivial. Aortic Valve: The aortic valve is tricuspid. Aortic valve regurgitation is not visualized. Aortic valve sclerosis is present, with no evidence of aortic valve stenosis. Pulmonic Valve: The pulmonic valve was not well visualized. Pulmonic valve regurgitation is trivial. Aorta: The aortic root and ascending aorta are structurally normal, with no evidence of dilitation. Venous: The inferior vena cava  is normal in size with greater than 50% respiratory variability, suggesting right atrial pressure of 3 mmHg. IAS/Shunts: The interatrial septum was not well visualized.  LEFT VENTRICLE PLAX 2D LVIDd:         4.60 cm   Diastology LVIDs:         3.10 cm   LV e' medial:    6.96 cm/s LV PW:         1.00 cm   LV E/e' medial:  12.9 LV IVS:        0.90 cm  LV e' lateral:   11.40 cm/s LVOT diam:     1.90 cm   LV E/e' lateral: 7.9 LV SV:         64 LV SV Index:   37 LVOT Area:     2.84 cm  RIGHT VENTRICLE             IVC RV Basal diam:  2.50 cm     IVC diam: 1.40 cm RV S prime:     18.20 cm/s TAPSE (M-mode): 2.0 cm LEFT ATRIUM             Index        RIGHT ATRIUM           Index LA diam:        3.90 cm 2.24 cm/m   RA Area:     13.90 cm LA Vol (A2C):   46.9 ml 26.99 ml/m  RA Volume:   33.00 ml  18.99 ml/m LA Vol (A4C):   33.9 ml 19.51 ml/m LA Biplane Vol: 42.3 ml 24.34 ml/m  AORTIC VALVE LVOT Vmax:   123.00 cm/s LVOT Vmean:  82.400 cm/s LVOT VTI:    0.225 m  AORTA Ao Root diam: 3.10 cm Ao Asc diam:  3.60 cm MITRAL VALVE                TRICUSPID VALVE MV Area (PHT): 4.15 cm     TR Peak grad:   27.0 mmHg MV Decel Time: 183 msec     TR Vmax:        260.00 cm/s MV E velocity: 89.50 cm/s MV A velocity: 101.00 cm/s  SHUNTS MV E/A ratio:  0.89         Systemic VTI:  0.22 m                             Systemic Diam: 1.90 cm Epifanio Lesches MD Electronically signed by Epifanio Lesches MD Signature Date/Time: 09/30/2022/5:10:21 PM    Final (Updated)    CT HEAD CODE STROKE WO CONTRAST  Result Date: 09/30/2022 CLINICAL DATA:  Code stroke. Right eye vision loss and right-sided weakness. EXAM: CT ANGIOGRAPHY HEAD AND NECK TECHNIQUE: Multidetector CT imaging of the head and neck was performed using the standard protocol during bolus administration of intravenous contrast. Multiplanar CT image reconstructions and MIPs were obtained to evaluate the vascular anatomy. Carotid stenosis measurements (when applicable) are  obtained utilizing NASCET criteria, using the distal internal carotid diameter as the denominator. RADIATION DOSE REDUCTION: This exam was performed according to the departmental dose-optimization program which includes automated exposure control, adjustment of the mA and/or kV according to patient size and/or use of iterative reconstruction technique. CONTRAST:  60 cc Omnipaque 350 COMPARISON:  None Available. FINDINGS: CT HEAD FINDINGS Brain: There is no acute intracranial hemorrhage, extra-axial fluid collection, or acute infarct Parenchymal volume is normal. The ventricles are normal in size. Gray-white differentiation is preserved. Postsurgical changes reflecting left frontotemporal craniotomy for aneurysm clipping are noted. The pituitary and suprasellar region are normal. There is no mass lesion. There is no mass effect or midline shift. Vascular: A left paraclinoid ICA aneurysm is noted. No hyperdense vessel is seen. Skull: Postsurgical changes as above. There is no acute calvarial fracture. Sinuses/Orbits: There is chronic right sphenoid sinusitis with near-complete opacification. The globes and orbits are unremarkable. Other: None. ASPECTS [facility] Stroke Program Early CT Score) - Ganglionic level infarction (caudate, lentiform nuclei, internal capsule,  insula, M1-M3 cortex): 7 - Supraganglionic infarction (M4-M6 cortex): 3 Total score (0-10 with 10 being normal): 10 CTA NECK FINDINGS Aortic arch: The imaged aortic arch is normal. The origin of the left subclavian artery is patent. The origins of the brachiocephalic and left common carotid artery are largely excluded from the field of view. The subclavian arteries are patent to the level imaged. Right carotid system: The right common, internal, and external carotid arteries are patent, without hemodynamically significant stenosis or occlusion. There is no evidence of dissection or aneurysm. Left carotid system: The left common, internal, and external  carotid arteries are patent, without hemodynamically significant stenosis or occlusion. There is no evidence of dissection or aneurysm. Vertebral arteries: The vertebral arteries are patent, without hemodynamically significant stenosis or occlusion there is no evidence of dissection or aneurysm. Skeleton: There is no acute osseous abnormality or suspicious osseous lesion. There is grade 1 anterolisthesis of C3 on C4 with reversal of the normal cervical curvature centered at C5. 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 There is no visible canal hematoma. Other neck: The soft tissues of the neck are unremarkable. Upper chest: The imaged lung apices are clear. Review of the MIP images confirms the above findings CTA HEAD FINDINGS Anterior circulation: Streak artifact from the aneurysm clip partially obscures portions of the distal left intracranial ICA and proximal left MCA. Within this confine: The intracranial ICAs are patent with minimal calcified plaque on the left but no significant stenosis or occlusion. The bilateral MCAs are patent, without proximal stenosis or occlusion. The bilateral ACAs are patent, without proximal stenosis or occlusion. The patient is status post clipping of the left paraclinoid aneurysm. There is no definite residual or recurrent aneurysm, though evaluation is degraded by streak artifact. There is no other aneurysm. There is no AVM. Posterior circulation: The bilateral V4 segments are patent. The basilar artery is patent. The major cerebellar arteries appear patent. The bilateral PCAs are patent, without proximal stenosis or occlusion. There is no aneurysm or AVM. Venous sinuses: Patent. Anatomic variants: None. Review of the MIP images confirms the above findings IMPRESSION: 1. No acute intracranial pathology. 2. No emergent vascular finding. Patent vasculature of the head and neck with no hemodynamically significant  stenosis, occlusion, or dissection. 3. Status post clipping of the left paraclinoid aneurysm without definite residual aneurysm, though evaluation is degraded by streak artifact. 4. Chronic right sphenoid sinusitis. Findings communicated to Dr Iver Nestle at 11:28 am via AMION. Electronically Signed   By: Lesia Hausen M.D.   On: 09/30/2022 11:51   CT ANGIO HEAD NECK W WO CM (CODE STROKE)  Result Date: 09/30/2022 CLINICAL DATA:  Code stroke. Right eye vision loss and right-sided weakness. EXAM: CT ANGIOGRAPHY HEAD AND NECK TECHNIQUE: Multidetector CT imaging of the head and neck was performed using the standard protocol during bolus administration of intravenous contrast. Multiplanar CT image reconstructions and MIPs were obtained to evaluate the vascular anatomy. Carotid stenosis measurements (when applicable) are obtained utilizing NASCET criteria, using the distal internal carotid diameter as the denominator. RADIATION DOSE REDUCTION: This exam was performed according to the departmental dose-optimization program which includes automated exposure control, adjustment of the mA and/or kV according to patient size and/or use of iterative reconstruction technique. CONTRAST:  60 cc Omnipaque 350 COMPARISON:  None Available. FINDINGS: CT HEAD FINDINGS Brain: There is no acute intracranial hemorrhage, extra-axial fluid collection, or acute infarct Parenchymal volume is normal. The ventricles are normal  in size. Gray-white differentiation is preserved. Postsurgical changes reflecting left frontotemporal craniotomy for aneurysm clipping are noted. The pituitary and suprasellar region are normal. There is no mass lesion. There is no mass effect or midline shift. Vascular: A left paraclinoid ICA aneurysm is noted. No hyperdense vessel is seen. Skull: Postsurgical changes as above. There is no acute calvarial fracture. Sinuses/Orbits: There is chronic right sphenoid sinusitis with near-complete opacification. The globes and  orbits are unremarkable. Other: None. ASPECTS [facility] Stroke Program Early CT Score) - Ganglionic level infarction (caudate, lentiform nuclei, internal capsule, insula, M1-M3 cortex): 7 - Supraganglionic infarction (M4-M6 cortex): 3 Total score (0-10 with 10 being normal): 10 CTA NECK FINDINGS Aortic arch: The imaged aortic arch is normal. The origin of the left subclavian artery is patent. The origins of the brachiocephalic and left common carotid artery are largely excluded from the field of view. The subclavian arteries are patent to the level imaged. Right carotid system: The right common, internal, and external carotid arteries are patent, without hemodynamically significant stenosis or occlusion. There is no evidence of dissection or aneurysm. Left carotid system: The left common, internal, and external carotid arteries are patent, without hemodynamically significant stenosis or occlusion. There is no evidence of dissection or aneurysm. Vertebral arteries: The vertebral arteries are patent, without hemodynamically significant stenosis or occlusion there is no evidence of dissection or aneurysm. Skeleton: There is no acute osseous abnormality or suspicious osseous lesion. There is grade 1 anterolisthesis of C3 on C4 with reversal of the normal cervical curvature centered at C5. 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 There is no visible canal hematoma. Other neck: The soft tissues of the neck are unremarkable. Upper chest: The imaged lung apices are clear. Review of the MIP images confirms the above findings CTA HEAD FINDINGS Anterior circulation: Streak artifact from the aneurysm clip partially obscures portions of the distal left intracranial ICA and proximal left MCA. Within this confine: The intracranial ICAs are patent with minimal calcified plaque on the left but no significant stenosis or occlusion. The bilateral MCAs are patent,  without proximal stenosis or occlusion. The bilateral ACAs are patent, without proximal stenosis or occlusion. The patient is status post clipping of the left paraclinoid aneurysm. There is no definite residual or recurrent aneurysm, though evaluation is degraded by streak artifact. There is no other aneurysm. There is no AVM. Posterior circulation: The bilateral V4 segments are patent. The basilar artery is patent. The major cerebellar arteries appear patent. The bilateral PCAs are patent, without proximal stenosis or occlusion. There is no aneurysm or AVM. Venous sinuses: Patent. Anatomic variants: None. Review of the MIP images confirms the above findings IMPRESSION: 1. No acute intracranial pathology. 2. No emergent vascular finding. Patent vasculature of the head and neck with no hemodynamically significant stenosis, occlusion, or dissection. 3. Status post clipping of the left paraclinoid aneurysm without definite residual aneurysm, though evaluation is degraded by streak artifact. 4. Chronic right sphenoid sinusitis. Findings communicated to Dr Iver Nestle at 11:28 am via AMION. Electronically Signed   By: Lesia Hausen M.D.   On: 09/30/2022 11:51    PHYSICAL EXAM  Temp:  [98.2 F (36.8 C)-98.7 F (37.1 C)] 98.5 F (36.9 C) (05/11 0745) Pulse Rate:  [66-98] 77 (05/11 0745) Resp:  [12-30] 16 (05/11 0745) BP: (103-137)/(61-97) 112/64 (05/11 0745) SpO2:  [93 %-97 %] 94 % (05/11 0745) Weight:  [63.5 kg] 63.5 kg (05/10 1102)  General - Well  nourished, well developed, in no apparent distress  Cardiovascular - Regular rhythm and rate.  Mental Status -  Level of arousal and orientation to time, place, and person were intact.  No dysarthria or if aphasia Language including expression, naming, repetition, comprehension was assessed and found intact. Attention span and concentration were normal. Recent and remote memory were intact. Fund of Knowledge was assessed and was intact.  Cranial Nerves II -  XII - II -right lower quadrant field cut III, IV, VI - Extraocular movements intact. V - Facial sensation intact bilaterally. VII -slight right facial VIII - Hearing & vestibular intact bilaterally. X - Palate elevates symmetrically. XI - Chin turning & shoulder shrug intact bilaterally. XII - Tongue protrusion intact.  Motor Strength - The patient's strength was normal in all extremities and pronator drift was absent.  Bulk was normal and fasciculations were absent.   Motor Tone - Muscle tone was assessed at the neck and appendages and was normal.  Sensory - Light touch, temperature/pinprick were assessed and were symmetrical.    Coordination - The patient had normal movements in the hands and feet with no ataxia or dysmetria.  Tremor was absent.  Gait and Station - deferred.  ASSESSMENT/PLAN Ms. DIDI BONGARD is a 67 y.o. female with history of hypertension, Sjogren's syndrome, cerebral aneurysm status post coiling in 1998, paroxysmal A-fib not on anticoagulation presented with blurred vision in the right eye.  Patient has been sick with likely viral infection for the last couple of days with a nonproductive cough.  Patient apparently was nauseous yesterday with improvement after taking Zofran.  Today she woke up and was feeling okay.  She apparently sat down at work to type on a keyboard when she noticed that she could not see out of her right visual field and that it was dark.   ? Left BRAO - left eye small scotoma  Stroke: Acute left occipital ischemic infarct, Likely cardioembolic with history of PAF not on anticoagulation Code Stroke  CT head No acute abnormality. ASPECTS 10.    CTA head & neck no LVO MRI unable to obtain due to s/p clipping Repeat CT acute moderate-sized left occipital infarct 2D Echo EF 60 to 65%. LDL 52 HgbA1c 5.2 VTE prophylaxis -Lovenox No antithrombotics prior to admission, now on aspirin 81 mg and Plavix 75 mg daily. Recommend to start eliquis in am  for further stroke prevention. DAPT will be discontinued.   Therapy recommendations: outpt PT Disposition: Pending  Paroxysmal A-fib Hx of 1 episode due to pericarditis with acute illness following an insect bite without noted recurrence since onset in July of 2017 However, given the current embolic stroke pattern, it is reasonable to think this is PAF instead of lone afib Will recommend eliquis to be started in am Once eliquis started, DAPT can be discontinued.   Hypertension Home meds: Nifedipine 30 mg Stable  Long-term BP goal normotensive  Hyperlipidemia Home meds: None LDL 52, goal < 70 High intensity statin not indicated due to LDL below goal  Other Stroke Risk Factors Advanced Age >/= 64   Other Active Problems Sjorgens  syndrome Cerebral aneurysm s/p coiling in 1998, current CTA showed s/p coiling at left ICA siphon without residue aneurysm   Hospital day # 0  Gevena Mart DNP, ACNPC-AG  [facility]  ATTENDING NOTE: I reviewed above note and agree with the assessment and plan. Pt was seen and examined.   Pt currently only has small left eye scotoma without other  neuro deficit. CT repeat showed left occipital infarcts, embolic pattern. With possible left BRAO, pt stroke concerning for cardioembolic source. Pt had episode of afib 11/2015 in the setting with pericarditis following an insect bite, reported no noted recurrence since. However, with current event, it is reasonable to think the afib is PAF instead of lone afib. Will d/c DAPT and put on eliquis in am. No statin needed given LDL at goal. Continue follow up with ophthamlology and cardiology.   For detailed assessment and plan, please refer to above/below as I have made changes wherever appropriate.   Neurology will sign off. Please call with questions. Pt will follow up with stroke clinic NP at [facility] in about 4 weeks. Thanks for the consult.   Marvel Plan, MD PhD Stroke Neurology 10/01/2022 11:32  PM   To contact Stroke Continuity provider, please refer to [email or web address]. After hours, contact General Neurology

## 2022-10-02 ENCOUNTER — Encounter: Payer: Self-pay | Admitting: Physician Assistant

## 2022-10-02 NOTE — Progress Notes (Signed)
Outpatient appt requested to cardmaster inbox by Dr. Thedore Mins for f/u appt with [facility]. Last seen in 2017 so sent staff msg to schedulers to arrange new patient appt - they will call patient with appt info.

## 2022-10-03 ENCOUNTER — Telehealth: Payer: Self-pay

## 2022-10-03 NOTE — Transitions of Care (Post Inpatient/ED Visit) (Signed)
10/03/2022  Name: [name] MRN: [id number] DOB: August 17, 1955  Today's TOC FU Call Status: Today's TOC FU Call Status:: Successful TOC FU Call Competed TOC FU Call Complete Date: 10/03/22  Transition Care Management Follow-up Telephone Call Date of Discharge: 10/01/22 Discharge Facility: [facility]) Type of Discharge: Inpatient Admission Primary Inpatient Discharge Diagnosis:: TIA (transient ischemic attack) How have you been since you were released from the hospital?: Better Any questions or concerns?: No  Items Reviewed: Did you receive and understand the discharge instructions provided?: Yes Medications obtained,verified, and reconciled?: Yes (Medications Reviewed) Any new allergies since your discharge?: No Dietary orders reviewed?: NA Do you have support at home?: Yes People in Home: child(ren), adult  Medications Reviewed Today: Medications Reviewed Today     Reviewed by Leigh Aurora, CMA (Certified Medical Assistant) on 10/03/22 at 1114  Med List Status: <None>   Medication Order Taking? Sig Documenting Provider Last Dose Status Informant  apixaban (ELIQUIS) 5 MG TABS tablet [id number]  Take 1 tablet (5 mg total) by mouth 2 (two) times daily. Maretta Bees, MD  Active   moxifloxacin (VIGAMOX) 0.5 % ophthalmic solution [id number] No Place 1 drop into both eyes 3 (three) times daily. Continuous course. [provider] 09/30/2022 Active Pharmacy Records, Self           Med Note (COFFELL, Marzella Schlein   Fri Sep 30, 2022  4:02 PM) Pt reports occasionally missing mid-day dose, but always uses at least twice daily.  NIFEdipine (ADALAT CC) 30 MG 24 hr tablet [id number] No Take 1 tablet (30 mg total) by mouth daily. Nelwyn Salisbury, MD 09/30/2022 Active Pharmacy Records, Self  Ondansetron HCl (ZOFRAN PO) [id number] No Take 1 tablet by mouth daily as needed (nausea, vomiting). [provider] Past Week Active Pharmacy Records, Self           Med Note  (COFFELL, Marzella Schlein   Fri Sep 30, 2022  4:03 PM) No fill history found, pt unable to remember strength.  Polyvinyl Alcohol-Povidone (REFRESH OP) [id number] No Place 1 drop into both eyes every 6 (six) hours as needed (dry eyes). [provider] 09/30/2022 Active Self  zolpidem (AMBIEN) 10 MG tablet [id number] No TAKE 1 TABLET BY MOUTH EVERYDAY AT BEDTIME  Patient taking differently: Take 5 mg by mouth at bedtime.   Nelwyn Salisbury, MD 09/29/2022 Active Pharmacy Records, Self            Home Care and Equipment/Supplies: Were Home Health Services Ordered?: NA Any new equipment or medical supplies ordered?: NA  Functional Questionnaire: Do you need assistance with bathing/showering or dressing?: No Do you need assistance with meal preparation?: No Do you need assistance with eating?: No Do you have difficulty maintaining continence: No Do you need assistance with getting out of bed/getting out of a chair/moving?: No Do you have difficulty managing or taking your medications?: No  Follow up appointments reviewed: PCP Follow-up appointment confirmed?: Yes Date of PCP follow-up appointment?: 10/07/22 Follow-up Provider: Dr. [facility] Follow-up appointment confirmed?: No Reason Specialist Follow-Up Not Confirmed: Patient has Specialist Provider Number and will Call for Appointment (pt is waiting for [facility] Neu[facility] and Dr. Jens Som @ Cardiology to call her with appt times. She will call them if she does not hear from them by this afternoon.) Do you need transportation to your follow-up appointment?: No Do you understand care options if your condition(s) worsen?: Yes-patient verbalized understanding    SIGNATURE Agnes Lawrence, CMA (AAMA)  [facility] [phone]

## 2022-10-07 ENCOUNTER — Encounter: Payer: Self-pay | Admitting: Family Medicine

## 2022-10-07 ENCOUNTER — Ambulatory Visit: Payer: BC Managed Care – PPO | Admitting: Family Medicine

## 2022-10-07 VITALS — BP 110/60 | HR 77 | Temp 98.1°F | Wt 151.2 lb

## 2022-10-07 DIAGNOSIS — I69312 Visuospatial deficit and spatial neglect following cerebral infarction: Secondary | ICD-10-CM

## 2022-10-07 DIAGNOSIS — E876 Hypokalemia: Secondary | ICD-10-CM | POA: Diagnosis not present

## 2022-10-07 DIAGNOSIS — I480 Paroxysmal atrial fibrillation: Secondary | ICD-10-CM

## 2022-10-07 DIAGNOSIS — I639 Cerebral infarction, unspecified: Secondary | ICD-10-CM

## 2022-10-07 DIAGNOSIS — I10 Essential (primary) hypertension: Secondary | ICD-10-CM

## 2022-10-07 DIAGNOSIS — M1611 Unilateral primary osteoarthritis, right hip: Secondary | ICD-10-CM

## 2022-10-07 DIAGNOSIS — M169 Osteoarthritis of hip, unspecified: Secondary | ICD-10-CM | POA: Insufficient documentation

## 2022-10-07 MED ORDER — POTASSIUM CHLORIDE ER 10 MEQ PO TBCR: 10.0000 meq | EXTENDED_RELEASE_TABLET | Freq: Every day | ORAL | 2 refills | Status: DC

## 2022-10-07 NOTE — Progress Notes (Signed)
   Subjective:    Patient ID: [name], female    DOB: [birth year], 67 y.o.   MRN: [id number]  HPI Here for a transitional care visit to follow up a hospital stay from 09-30-22 to 10-01-22 for an acute stroke. She presented with the sudden onset of blurred vision in the right eye and loss of the right lateral visual field. Her exam was normal. EKG was normal. Telemetry showed her to be in constant sinus rhythm. A CT angiogram of the head and neck was normal. A CT of the head revealed am acute infarct in the left occipital cortex. Labs were significant for a low potassium of 3.0 on admission. This was corrected with IV fluids, and it was up to 3.3 on DC. An ECHO showed an EF of 60-65% and no LAE. Because she has a hx of PAF in the past, it was decided to start her on permanent anticoagulation with Eliquis. Since going home she has felt fine with no neurologic deficits except the blurred vision in the right eye. She does have chronic right hip pain from severe OA.    Review of Systems  Constitutional: Negative.   Eyes:  Positive for visual disturbance.  Respiratory: Negative.    Cardiovascular: Negative.   Neurological:  Negative for dizziness, tremors, seizures, syncope, facial asymmetry, speech difficulty, weakness, light-headedness, numbness and headaches.       Objective:   Physical Exam Constitutional:      Appearance: She is not ill-appearing.     Comments: Walks with a limp   Cardiovascular:     Rate and Rhythm: Normal rate and regular rhythm.     Pulses: Normal pulses.     Heart sounds: Normal heart sounds.  Pulmonary:     Effort: Pulmonary effort is normal.     Breath sounds: Normal breath sounds.  Neurological:     General: No focal deficit present.     Mental Status: She is alert and oriented to person, place, and time.           Assessment & Plan:  She is recovering from a stroke which was presumed to be embolic. No definitive source was found, but it was decided  that an episode of atrial fibrillation was the most likely etiology. She is in NSR now. She has been started on Eliquis. We will correct the hypokalemia with Klor-con 10 mEq daily. Recheck a BMET in 2-3 weeks. She will follow up with Ihor Austin NP in Neurology on 12-06-22. She will follow up with Dr. Jens Som in Cardiology on 12-05-22.  Gershon Crane, MD

## 2022-10-21 ENCOUNTER — Other Ambulatory Visit (INDEPENDENT_AMBULATORY_CARE_PROVIDER_SITE_OTHER): Payer: BC Managed Care – PPO

## 2022-10-21 DIAGNOSIS — E876 Hypokalemia: Secondary | ICD-10-CM

## 2022-10-21 LAB — BASIC METABOLIC PANEL
BUN: 12 mg/dL (ref 6–23)
CO2: 25 mEq/L (ref 19–32)
Calcium: 9.2 mg/dL (ref 8.4–10.5)
Chloride: 105 mEq/L (ref 96–112)
Creatinine, Ser: 0.82 mg/dL (ref 0.40–1.20)
GFR: 74.68 mL/min (ref >60.00)
Glucose, Bld: 90 mg/dL (ref 70–99)
Potassium: 3.9 mEq/L (ref 3.5–5.1)
Sodium: 138 mEq/L (ref 135–145)

## 2022-11-17 ENCOUNTER — Other Ambulatory Visit: Payer: Self-pay | Admitting: Family Medicine

## 2022-11-18 NOTE — Telephone Encounter (Signed)
Pt LOV was on 10/07/22 Last refill was done on 04/22/22 Please advise

## 2022-11-23 NOTE — Progress Notes (Signed)
Garen Grams, MD Reason for referral-TIA  HPI: 67 year old female for evaluation of TIA at request of Gershon Crane, MD.  Patient seen previously but not since 2017. She was previously admitted 2017 following an insect bite and was noted to be in atrial fibrillation. She was felt to have pericarditis as well. CTA July 2017 showed no pulmonary embolus.  Echocardiogram August 2017 showed normal LV systolic function, grade 2 diastolic dysfunction, mild right atrial enlargement and moderate tricuspid regurgitation. Patient was placed on amiodarone as her blood pressure was borderline. However it was felt this would not need to be continued long-term as atrial arrhythmia was felt likely secondary to her pericarditis. TSH normal. Amiodarone DCed 8/17.  Recently admitted May 2024 with CVA.  CTA showed no hemodynamically significant stenosis.  Echocardiogram May 2024 showed normal LV function.  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  Anticoagulation was initiated.  Cardiology now asked to evaluate.  Patient's visual disturbance has improved since her CVA.  She denies dyspnea, chest pain, palpitations or syncope.  Current Outpatient Medications  Medication Sig Dispense Refill   apixaban (ELIQUIS) 5 MG TABS tablet Take 1 tablet (5 mg total) by mouth 2 (two) times daily. 60 tablet 1   moxifloxacin (VIGAMOX) 0.5 % ophthalmic solution Place 1 drop into both eyes 3 (three) times daily. Continuous course.     NIFEdipine (ADALAT CC) 30 MG 24 hr tablet Take 1 tablet (30 mg total) by mouth daily. 90 tablet 3   Ondansetron HCl (ZOFRAN PO) Take 1 tablet by mouth daily as needed (nausea, vomiting).     Polyvinyl Alcohol-Povidone (REFRESH OP) Place 1 drop into both eyes every 6 (six) hours as needed (dry eyes).     zolpidem (AMBIEN) 10 MG tablet TAKE 1 TABLET BY MOUTH EVERYDAY AT BEDTIME 30 tablet 5   potassium chloride (KLOR-CON 10) 10 MEQ tablet Take 1 tablet (10 mEq total) by  mouth daily. (Patient not taking: Reported on 12/05/2022) 30 tablet 2   No current facility-administered medications for this visit.    Allergies  Allergen Reactions   Vibra-Tab [Doxycycline] Nausea Only and Other (See Comments)    Triggered A-fib     Past Medical History:  Diagnosis Date   Acute renal failure (ARF) (HCC)    Atrial fibrillation (HCC)    CVA (cerebral vascular accident) (HCC)    Headache(784.0)    Hypertension    Insomnia    Intracranial aneurysm    status post surgery   Neurotrophic keratitis of both eyes    sees Dr. Sinda Du of [facility] Ophth   Sjogren's syndrome [facility])    sees Dr. Alben Deeds     Past Surgical History:  Procedure Laterality Date   ABDOMINAL HYSTERECTOMY     CEREBRAL ANEURYSM REPAIR  1998   By Dr Newell Coral   COLONOSCOPY  10/01/2015   per Dr. Marina Goodell, adenomatous polyps, repeat in 5 yrs    OVARIAN CYST REMOVAL      Social History   Socioeconomic History   Marital status: Divorced    Spouse name: Not on file   Number of children: 1   Years of education: Not on file   Highest education level: Not on file  Occupational History   Occupation: Deputy Solicitor    Comment: [employer]  Tobacco Use   Smoking status: Never   Smokeless tobacco: Never  Substance and Sexual Activity   Alcohol use: Not Currently    Comment: social  Drug use: No   Sexual activity: Not on file  Other Topics Concern   Not on file  Social History Narrative   Not on file   Social Determinants of Health   Financial Resource Strain: Not on file  Food Insecurity: No Food Insecurity (09/30/2022)   Hunger Vital Sign    Worried About Running Out of Food in the Last Year: Never true    Ran Out of Food in the Last Year: Never true  Transportation Needs: No Transportation Needs (09/30/2022)   PRAPARE - Administrator, Civil Service (Medical): No    Lack of Transportation (Non-Medical): No  Physical Activity: Not on file  Stress: Not on file   Social Connections: Not on file  Intimate Partner Violence: Not At Risk (09/30/2022)   Humiliation, Afraid, Rape, and Kick questionnaire    Fear of Current or Ex-Partner: No    Emotionally Abused: No    Physically Abused: No    Sexually Abused: No    Family History  Problem Relation Age of Onset   Hypertension Mother    Cholelithiasis Mother    Scoliosis Mother    Arthritis Mother    Sudden death Father    Aneurysm Father        brain   Anuerysm Sister        brain   Aneurysm Maternal Uncle    Cholelithiasis Daughter    Colon cancer Neg Hx    Esophageal cancer Neg Hx    Pancreatic cancer Neg Hx    Rectal cancer Neg Hx    Stomach cancer Neg Hx    Breast cancer Neg Hx     ROS: no fevers or chills, productive cough, hemoptysis, dysphasia, odynophagia, melena, hematochezia, dysuria, hematuria, rash, seizure activity, orthopnea, PND, pedal edema, claudication. Remaining systems are negative.  Physical Exam:   Blood pressure 124/68, pulse 68, height 5\' 8"  (1.727 m), weight 151 lb (68.5 kg), SpO2 95%.  General:  Well developed/well nourished in NAD Skin warm/dry Patient not depressed No peripheral clubbing Back-normal HEENT-normal/normal eyelids Neck supple/normal carotid upstroke bilaterally; no bruits; no JVD; no thyromegaly chest - CTA/ normal expansion CV - RRR/normal S1 and S2; no murmurs, rubs or gallops;  PMI nondisplaced Abdomen -NT/ND, no HSM, no mass, + bowel sounds, no bruit 2+ femoral pulses, no bruits Ext-no edema, chords, 2+ DP Neuro-grossly nonfocal  ECG - 09/30/22 NSR, no ST changes; personally reviewed  A/P  1 recent CVA-patient had transient atrial fibrillation in the past in association with pericarditis.  She was treated with amiodarone.  Anticoagulation/amiodarone was ultimately discontinued.  However she had a recent CVA and was reinitiated on anticoagulation which I think is appropriate.  Note there was no documentation of atrial fibrillation but  given her history I think she will require long-term anticoagulation.  2 hypertension-continue present blood pressure medications and follow.  Olga Millers, MD

## 2022-12-05 ENCOUNTER — Encounter: Payer: Self-pay | Admitting: Cardiology

## 2022-12-05 ENCOUNTER — Ambulatory Visit: Payer: BC Managed Care – PPO | Attending: Cardiology | Admitting: Cardiology

## 2022-12-05 VITALS — BP 124/68 | HR 68 | Ht 68.0 in | Wt 151.0 lb

## 2022-12-05 DIAGNOSIS — I10 Essential (primary) hypertension: Secondary | ICD-10-CM | POA: Diagnosis not present

## 2022-12-05 DIAGNOSIS — I639 Cerebral infarction, unspecified: Secondary | ICD-10-CM

## 2022-12-05 DIAGNOSIS — I480 Paroxysmal atrial fibrillation: Secondary | ICD-10-CM

## 2022-12-05 NOTE — Progress Notes (Unsigned)
[facility] [address] [place]. [place] [address] [phone]       HOSPITAL FOLLOW UP NOTE  [name] Date of Birth:  1956/02/20 Medical Record Number:  [id number]   Reason for Referral:  hospital stroke follow up    SUBJECTIVE:   CHIEF COMPLAINT:  No chief complaint on file.   HPI:   [name] is a 67 y.o. female with history of hypertension, Sjogren's syndrome, cerebral aneurysm status post coiling in 1998, paroxysmal A-fib not on anticoagulation who presented to ED on 09/30/2022 with right eye peripheral visual loss and left eye scotoma.  Stroke workup revealed acute left occipital ischemic infarct and possible left BRAO likely cardioembolic with history of PAF not on AC. Per note, patient had episode of A-fib 11/2015 in setting of pericarditis following an insect bite with no recurrent since but due to current embolic stroke, felt likely PAF instead of lone afib and placed on Eliquis.  CTA head/neck unremarkable. LDL 52.  A1c 5.2.  High-intensity statin not indicated due to LDL below goal.         PERTINENT IMAGING  Code Stroke  CT head No acute abnormality. ASPECTS 10.    CTA head & neck no LVO MRI unable to obtain due to s/p clipping Repeat CT acute moderate-sized left occipital infarct 2D Echo EF 60 to 65%. LDL 52 HgbA1c 5.2    ROS:   14 system review of systems performed and negative with exception of ***  PMH:  Past Medical History:  Diagnosis Date   Acute renal failure (ARF) (HCC)    Headache(784.0)    Hypertension    Insomnia    Intracranial aneurysm    status post surgery   Neurotrophic keratitis of both eyes    [facility] Sinda Du of [facility]   Sjogren's syndrome [facility])    sees Dr. Alben Deeds     PSH:  Past Surgical History:  Procedure Laterality Date   ABDOMINAL HYSTERECTOMY     CEREBRAL ANEURYSM REPAIR  1998   By Dr Newell Coral   COLONOSCOPY  10/01/2015   per Dr. Marina Goodell, adenomatous  polyps, repeat in 5 yrs    OVARIAN CYST REMOVAL      Social History:  Social History   Socioeconomic History   Marital status: Single    Spouse name: Not on file   Number of children: 1   Years of education: Not on file   Highest education level: Not on file  Occupational History   Occupation: Deputy Solicitor    Comment: [employer]  Tobacco Use   Smoking status: Never   Smokeless tobacco: Never  Substance and Sexual Activity   Alcohol use: Yes    Alcohol/week: 0.0 standard drinks of alcohol    Comment: social   Drug use: No   Sexual activity: Not on file  Other Topics Concern   Not on file  Social History Narrative   Not on file   Social Determinants of Health   Financial Resource Strain: Not on file  Food Insecurity: No Food Insecurity (09/30/2022)   Hunger Vital Sign    Worried About Running Out of Food in the Last Year: Never true    Ran Out of Food in the Last Year: Never true  Transportation Needs: No Transportation Needs (09/30/2022)   PRAPARE - Administrator, Civil Service (Medical): No    Lack of Transportation (Non-Medical): No  Physical Activity: Not on file  Stress: Not on file  Social Connections: Not on file  Intimate Partner Violence: Not At Risk (09/30/2022)   Humiliation, Afraid, Rape, and Kick questionnaire    Fear of Current or Ex-Partner: No    Emotionally Abused: No    Physically Abused: No    Sexually Abused: No    Family History:  Family History  Problem Relation Age of Onset   Hypertension Mother    Cholelithiasis Mother    Scoliosis Mother    Arthritis Mother    Sudden death Father    Aneurysm Father        brain   Anuerysm Sister        brain   Aneurysm Maternal Uncle    Cholelithiasis Daughter    Colon cancer Neg Hx    Esophageal cancer Neg Hx    Pancreatic cancer Neg Hx    Rectal cancer Neg Hx    Stomach cancer Neg Hx    Breast cancer Neg Hx     Medications:   Current Outpatient Medications on File Prior to  Visit  Medication Sig Dispense Refill   apixaban (ELIQUIS) 5 MG TABS tablet Take 1 tablet (5 mg total) by mouth 2 (two) times daily. 60 tablet 1   moxifloxacin (VIGAMOX) 0.5 % ophthalmic solution Place 1 drop into both eyes 3 (three) times daily. Continuous course.     NIFEdipine (ADALAT CC) 30 MG 24 hr tablet Take 1 tablet (30 mg total) by mouth daily. 90 tablet 3   Ondansetron HCl (ZOFRAN PO) Take 1 tablet by mouth daily as needed (nausea, vomiting).     Polyvinyl Alcohol-Povidone (REFRESH OP) Place 1 drop into both eyes every 6 (six) hours as needed (dry eyes).     potassium chloride (KLOR-CON 10) 10 MEQ tablet Take 1 tablet (10 mEq total) by mouth daily. 30 tablet 2   zolpidem (AMBIEN) 10 MG tablet TAKE 1 TABLET BY MOUTH EVERYDAY AT BEDTIME 30 tablet 5   No current facility-administered medications on file prior to visit.    Allergies:   Allergies  Allergen Reactions   Vibra-Tab [Doxycycline] Nausea Only and Other (See Comments)    Triggered A-fib      OBJECTIVE:  Physical Exam  There were no vitals filed for this visit. There is no height or weight on file to calculate BMI. No results found.     05/12/2022    9:02 AM  Depression screen PHQ 2/9  Decreased Interest 0  Down, Depressed, Hopeless 0  PHQ - 2 Score 0  Altered sleeping 0  Tired, decreased energy 0  Change in appetite 0  Feeling bad or failure about yourself  0  Trouble concentrating 0  Moving slowly or fidgety/restless 0  Suicidal thoughts 0  PHQ-9 Score 0  Difficult doing work/chores Not difficult at all     General: well developed, well nourished, seated, in no evident distress Head: head normocephalic and atraumatic.   Neck: supple with no carotid or supraclavicular bruits Cardiovascular: regular rate and rhythm, no murmurs Musculoskeletal: no deformity Skin:  no rash/petichiae Vascular:  Normal pulses all extremities   Neurologic Exam Mental Status: Awake and fully alert. Oriented to place  and time. Recent and remote memory intact. Attention span, concentration and fund of knowledge appropriate. Mood and affect appropriate.  Cranial Nerves: Fundoscopic exam reveals sharp disc margins. Pupils equal, briskly reactive to light. Extraocular movements full without nystagmus. Visual fields full to confrontation. Hearing intact. Facial sensation intact. Face, tongue, palate moves normally  and symmetrically.  Motor: Normal bulk and tone. Normal strength in all tested extremity muscles Sensory.: intact to touch , pinprick , position and vibratory sensation.  Coordination: Rapid alternating movements normal in all extremities. Finger-to-nose and heel-to-shin performed accurately bilaterally. Gait and Station: Arises from chair without difficulty. Stance is normal. Gait demonstrates normal stride length and balance with ***. Tandem walk and heel toe ***.  Reflexes: 1+ and symmetric. Toes downgoing.     NIHSS  *** Modified Rankin  ***      ASSESSMENT: [name] is a 66 y.o. year old female with left occipital ischemic infarct and questionable left BRAO on 09/30/2022 likely cardioembolic with history of PAF not on AC. Vascular risk factors include PAF, HTN and advanced age.      PLAN:  Left occipital stroke ? L BRAO:  Residual deficit: ***.  Continue Eliquis 5mg  twice daily for secondary stroke prevention.  Statin therapy not recommended by Dr. Roda Shutters as LDL below goal Discussed secondary stroke prevention measures and importance of close PCP follow up for aggressive stroke risk factor management including BP goal<130/90, HLD with LDL goal<70 and DM with A1c.<7 .  Stroke labs 09/2022: LDL 52, A1c 5.2 I have gone over the pathophysiology of stroke, warning signs and symptoms, risk factors and their management in some detail with instructions to go to the closest emergency room for symptoms of concern. PAF:  1 episode on 2017 due to pericarditis without reoccurrence since Due to  recent embolic stroke, suspect PAF instead of lone Afib  Continue to follow with cardiology   Follow up in *** or call earlier if needed   CC:  [facility] provider: Dr. Pearlean Brownie PCP: Nelwyn Salisbury, MD    I spent *** minutes of face-to-face and non-face-to-face time with patient.  This included previsit chart review including review of recent hospitalization, lab review, study review, order entry, electronic health record documentation, patient education regarding recent stroke including etiology, secondary stroke prevention measures and importance of managing stroke risk factors, residual deficits and typical recovery time and answered all other questions to patient satisfaction   Ihor Austin, AGNP-BC  [facility] [address] [place], [place] [address]  Phone [phone] Fax [phone] Note: This document was prepared with digital dictation and possible smart phrase technology. Any transcriptional errors that result from this process are unintentional.

## 2022-12-05 NOTE — Patient Instructions (Signed)
     Follow-Up: At [facility], you and your health needs are our priority.  As part of our continuing mission to provide you with exceptional heart care, we have created designated Provider Care Teams.  These Care Teams include your primary Cardiologist (physician) and Advanced Practice Providers (APPs -  Physician Assistants and Nurse Practitioners) who all work together to provide you with the care you need, when you need it.  We recommend signing up for the patient portal called "MyChart".  Sign up information is provided on this After Visit Summary.  MyChart is used to connect with patients for Virtual Visits (Telemedicine).  Patients are able to view lab/test results, encounter notes, upcoming appointments, etc.  Non-urgent messages can be sent to your provider as well.   To learn more about what you can do with MyChart, go to [email or web address].    Your next appointment:   6 month(s)  Provider:   Kirk Ruths MD

## 2022-12-06 ENCOUNTER — Inpatient Hospital Stay: Payer: Self-pay | Admitting: Adult Health

## 2022-12-06 ENCOUNTER — Encounter: Payer: Self-pay | Admitting: Adult Health

## 2022-12-06 VITALS — BP 116/79 | HR 77 | Ht 67.0 in | Wt 150.0 lb

## 2022-12-06 DIAGNOSIS — I639 Cerebral infarction, unspecified: Secondary | ICD-10-CM

## 2022-12-06 DIAGNOSIS — H34232 Retinal artery branch occlusion, left eye: Secondary | ICD-10-CM | POA: Diagnosis not present

## 2022-12-06 DIAGNOSIS — I480 Paroxysmal atrial fibrillation: Secondary | ICD-10-CM

## 2022-12-06 NOTE — Patient Instructions (Addendum)
Continue  Eliquis 5 mg twice daily  for secondary stroke prevention and atrial fibrillation   Continue to follow with cardiology for atrial fibrillation and Eliquis management  Continue to follow up with PCP regarding blood pressure management  Maintain strict control of hypertension with blood pressure goal below 130/90 and cholesterol with LDL cholesterol (bad cholesterol) goal below 70 mg/dL.   Signs of a Stroke? Follow the BEFAST method:  Balance Watch for a sudden loss of balance, trouble with coordination or vertigo Eyes Is there a sudden loss of vision in one or both eyes? Or double vision?  Face: Ask the person to smile. Does one side of the face droop or is it numb?  Arms: Ask the person to raise both arms. Does one arm drift downward? Is there weakness or numbness of a leg? Speech: Ask the person to repeat a simple phrase. Does the speech sound slurred/strange? Is the person confused ? Time: If you observe any of these signs, call 911.       Thank you for coming to see [place] at [facility]. I hope we have been able to provide you high quality care today.  You may receive a patient satisfaction survey over the next few weeks. We would appreciate your feedback and comments so that we may continue to improve ourselves and the health of our patients.    Stroke Prevention Some medical conditions and lifestyle choices can lead to a higher risk for a stroke. You can help to prevent a stroke by eating healthy foods and exercising. It also helps to not smoke and to manage any health problems you may have. How can this condition affect me? A stroke is an emergency. It should be treated right away. A stroke can lead to brain damage or threaten your life. There is a better chance of surviving and getting better after a stroke if you get medical help right away. What can increase my risk? The following medical conditions may increase your risk of a stroke: Diseases of the  heart and blood vessels (cardiovascular disease). High blood pressure (hypertension). Diabetes. High cholesterol. Sickle cell disease. Problems with blood clotting. Being very overweight. Sleeping problems (obstructivesleep apnea). Other risk factors include: Being older than age 25. A history of blood clots, stroke, or mini-stroke (TIA). Race, ethnic background, or a family history of stroke. Smoking or using tobacco products. Taking birth control pills, especially if you smoke. Heavy alcohol and drug use. Not being active. What actions can I take to prevent this? Manage your health conditions High cholesterol. Eat a healthy diet. If this is not enough to manage your cholesterol, you may need to take medicines. Take medicines as told by your doctor. High blood pressure. Try to keep your blood pressure below 130/80. If your blood pressure cannot be managed through a healthy diet and regular exercise, you may need to take medicines. Take medicines as told by your doctor. Ask your doctor if you should check your blood pressure at home. Have your blood pressure checked every year. Diabetes. Eat a healthy diet and get regular exercise. If your blood sugar (glucose) cannot be managed through diet and exercise, you may need to take medicines. Take medicines as told by your doctor. Talk to your doctor about getting checked for sleeping problems. Signs of a problem can include: Snoring a lot. Feeling very tired. Make sure that you manage any other conditions you have. Nutrition  Follow instructions from your doctor about what to eat  or drink. You may be told to: Eat and drink fewer calories each day. Limit how much salt (sodium) you use to 1,500 milligrams (mg) each day. Use only healthy fats for cooking, such as olive oil, canola oil, and sunflower oil. Eat healthy foods. To do this: Choose foods that are high in fiber. These include whole grains, and fresh fruits and  vegetables. Eat at least 5 servings of fruits and vegetables a day. Try to fill one-half of your plate with fruits and vegetables at each meal. Choose low-fat (lean) proteins. These include low-fat cuts of meat, chicken without skin, fish, tofu, beans, and nuts. Eat low-fat dairy products. Avoid foods that: Are high in salt. Have saturated fat. Have trans fat. Have cholesterol. Are processed or pre-made. Count how many carbohydrates you eat and drink each day. Lifestyle If you drink alcohol: Limit how much you have to: 0-1 drink a day for women who are not pregnant. 0-2 drinks a day for men. Know how much alcohol is in your drink. In the [place] one drink equals one 12 oz bottle of beer ( ), one 5 oz glass of wine ( ), or one 1 oz glass of hard liquor (44mL). Do not smoke or use any products that have nicotine or tobacco. If you need help quitting, ask your doctor. Avoid secondhand smoke. Do not use drugs. Activity  Try to stay at a healthy weight. Get at least 30 minutes of exercise on most days, such as: Fast walking. Biking. Swimming. Medicines Take over-the-counter and prescription medicines only as told by your doctor. Avoid taking birth control pills. Talk to your doctor about the risks of taking birth control pills if: You are over 46 years old. You smoke. You get very bad headaches. You have had a blood clot. Where to find more information American Stroke Association: www.strokeassociation.org Get help right away if: You or a loved one has any signs of a stroke. "BE FAST" is an easy way to remember the warning signs: B - Balance. Dizziness, sudden trouble walking, or loss of balance. E - Eyes. Trouble seeing or a change in how you see. F - Face. Sudden weakness or loss of feeling of the face. The face or eyelid may droop on one side. A - Arms. Weakness or loss of feeling in an arm. This happens all of a sudden and most often on one side of the body. S -  Speech. Sudden trouble speaking, slurred speech, or trouble understanding what people say. T - Time. Time to call emergency services. Write down what time symptoms started. You or a loved one has other signs of a stroke, such as: A sudden, very bad headache with no known cause. Feeling like you may vomit (nausea). Vomiting. A seizure. These symptoms may be an emergency. Get help right away. Call your local emergency services (911 in the [place] Do not wait to see if the symptoms will go away. Do not drive yourself to the hospital. Summary You can help to prevent a stroke by eating healthy, exercising, and not smoking. It also helps to manage any health problems you have. Do not smoke or use any products that contain nicotine or tobacco. Get help right away if you or a loved one has any signs of a stroke. This information is not intended to replace advice given to you by your health care provider. Make sure you discuss any questions you have with your health care provider. Document Revised: 04/11/2022 Document Reviewed: 04/11/2022 Elsevier Patient Education  2024 Elsevier Inc.

## 2022-12-07 ENCOUNTER — Telehealth: Payer: Self-pay | Admitting: Cardiology

## 2022-12-07 NOTE — Telephone Encounter (Signed)
Pt c/o medication issue:  1. Name of Medication: Voltaren Cream - Pain medicine for Arthiritis  2. How are you currently taking this medication (dosage and times per day)?   3. Are you having a reaction (difficulty breathing--STAT)?   4. What is your medication issue? Pt says she is on Eliquis and this medicine have Aspirin in it. She wants to know if Dr Jens Som thinks it will be alright for her to take?

## 2022-12-08 NOTE — Telephone Encounter (Signed)
Lewayne Bunting, MD  Freddi Starr, RN1 hour ago (7:54 AM)    Voltaren cream ok Olga Millers

## 2022-12-08 NOTE — Telephone Encounter (Signed)
Left message for patient that it is OK to use voltaren cream, per MD message

## 2022-12-22 ENCOUNTER — Telehealth: Payer: Self-pay | Admitting: Family Medicine

## 2022-12-22 DIAGNOSIS — M25551 Pain in right hip: Secondary | ICD-10-CM

## 2022-12-22 NOTE — Telephone Encounter (Signed)
Pt called to request a referral to see an Orthopedic Specialist.

## 2022-12-23 NOTE — Telephone Encounter (Signed)
Called pt to see why the referral was needed but no answer.

## 2022-12-27 NOTE — Telephone Encounter (Signed)
Left detailed message for pt advised to call the office back regarding referral to Orthopedics as requested

## 2022-12-28 NOTE — Telephone Encounter (Addendum)
Pt is returning your call please call her at work number [phone]

## 2022-12-28 NOTE — Telephone Encounter (Signed)
Spoke with pt states that she wants a referral to [facility] Dr Gus Rankin. Aluisio. Pt states that she has been having severe Osteo-Arthritis pain in her Right Hip that is making it difficult for her to walk. Please advise

## 2022-12-29 NOTE — Telephone Encounter (Signed)
Left detailed message for pt advised that Dr Clent Ridges placed referral as requested

## 2022-12-29 NOTE — Addendum Note (Signed)
Addended by: Gershon Crane A on: 12/29/2022 07:54 AM   Modules accepted: Orders

## 2022-12-29 NOTE — Telephone Encounter (Signed)
Done

## 2023-01-02 ENCOUNTER — Other Ambulatory Visit: Payer: Self-pay | Admitting: Family Medicine

## 2023-01-25 ENCOUNTER — Telehealth: Payer: Self-pay | Admitting: Cardiology

## 2023-01-25 DIAGNOSIS — I480 Paroxysmal atrial fibrillation: Secondary | ICD-10-CM

## 2023-01-25 MED ORDER — APIXABAN 5 MG PO TABS
5.0000 mg | ORAL_TABLET | Freq: Two times a day (BID) | ORAL | 5 refills | Status: DC
Start: 2023-01-25 — End: 2023-07-21

## 2023-01-25 NOTE — Telephone Encounter (Signed)
*  STAT* If patient is at the pharmacy, call can be transferred to refill team.   1. Which medications need to be refilled? (please list name of each medication and dose if known)   apixaban (ELIQUIS) 5 MG TABS tablet     2. Would you like to learn more about the convenience, safety, & potential cost savings by using the [facility]? No   3. Are you open to using the [facility] (Type [facility].) No   4. Which pharmacy/location (including street and city if local pharmacy) is medication to be sent to?  [id number] - [place], [place] - [address]     5. Do they need a 30 day or 90 day supply? 30 day

## 2023-01-25 NOTE — Telephone Encounter (Signed)
Prescription refill request for Eliquis received. Indication: Afib  Last office visit: 12/05/22 ([place])  Scr: 0.82 (10/21/22)  Age: 67 Weight: 68kg  Appropriate dose. Refill sent.

## 2023-02-15 ENCOUNTER — Telehealth: Payer: Self-pay | Admitting: *Deleted

## 2023-02-15 NOTE — Telephone Encounter (Signed)
1st attempt to reach pt to schedule tele visit. Lvm

## 2023-02-15 NOTE — Telephone Encounter (Signed)
Patient with diagnosis of afib on Eliquis for anticoagulation.    Procedure: 4 dental extractions Date of procedure: TBD  CHA2DS2-VASc Score = 5  This indicates a 7.2% annual risk of stroke. 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 Also has prior TIA.  CrCl 41mL/min Platelet count 228K  Patient does not require pre-op antibiotics for dental procedure.  Ideally do not want to hold anticoag given recent CVA. Would see if dentist can space apart extractions and complete them 1-2 at a time so that pt can continue on her anticoagulation uninterrupted.  **This guidance is not considered finalized until pre-operative APP has relayed final recommendations.**

## 2023-02-15 NOTE — Telephone Encounter (Signed)
Will route to pharm for input on holding Eliquis, then anticipate VV given need for medical clearance requested. Preliminarily do not see any SBE ppx needs.

## 2023-02-15 NOTE — Telephone Encounter (Signed)
Pre-operative Risk Assessment    Patient Name: [name]  DOB: 1956/01/03 MRN: [id number]    DATE OF LAST VISIT: 12/05/22 DR. CRENSHAW DATE OF NEXT VISIT: NONE Request for Surgical Clearance    Procedure:  Dental Extraction - Amount of Teeth to be Pulled:  4 EXTRACTIONS  (SURGICAL)  Date of Surgery:  Clearance TBD                                 Surgeon:  DR. CLAY BURTON,DDS Surgeon's Group or Practice Name:  [facility] Phone number:  [phone] Fax number:  [phone]   Type of Clearance Requested:   - Medical  - Pharmacy:  Hold Apixaban (Eliquis) 3-5 DAYS PRIOR   Type of Anesthesia:  Local    Additional requests/questions:    Elpidio Anis   02/15/2023, 11:01 AM  3

## 2023-02-15 NOTE — Telephone Encounter (Signed)
Name: [name]  DOB: Oct 10, 1955  MRN: [id number]  Primary Cardiologist: Olga Millers, MD   Preoperative team, please contact this patient and set up a phone call appointment for further preoperative risk assessment. Please obtain consent and complete medication review. Thank you for your help.  I confirm that guidance regarding antiplatelet and oral anticoagulation therapy has been completed and, if necessary, noted below.  Patient with diagnosis of afib on Eliquis for anticoagulation.     Procedure: 4 dental extractions Date of procedure: TBD   CHA2DS2-VASc Score = 5  This indicates a 7.2% annual risk of stroke. 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 Also has prior TIA.   CrCl 51mL/min Platelet count 228K   Patient does not require pre-op antibiotics for dental procedure.   Ideally do not want to hold anticoag given recent CVA. Would see if dentist can space apart extractions and complete them 1-2 at a time so that pt can continue on her anticoagulation uninterrupted.   Ronney Asters, NP 02/15/2023, 12:25 PM  [facility]

## 2023-02-22 NOTE — Telephone Encounter (Signed)
2nd attempt to reach pt to set up tele pre op appt. I will update the DDS office that we have tried x 2 to reach the pt.

## 2023-02-22 NOTE — Telephone Encounter (Signed)
Our office received a duplicate request stating 2nd request. I called the dental office and informed them that we do have the clearance request, that the pt is going to need a tele preop appt and that we have left a message for her to call back. DDS office thanked me for updating their office.

## 2023-02-23 NOTE — Telephone Encounter (Signed)
3rd attempt to reach pt, will route back to requesting office to make them aware.

## 2023-02-24 ENCOUNTER — Telehealth: Payer: Self-pay | Admitting: *Deleted

## 2023-02-24 ENCOUNTER — Telehealth: Payer: Self-pay | Admitting: Cardiology

## 2023-02-24 NOTE — Telephone Encounter (Signed)
I s/w the pt and she has been scheduled for tele pre op appt 03/09/23. Med rec and consent are done.     Patient Consent for Virtual Visit        [name] has provided verbal consent on 02/24/2023 for a virtual visit (video or telephone).   CONSENT FOR VIRTUAL VISIT FOR:  [name]  By participating in this virtual visit I agree to the following:  I hereby voluntarily request, consent and authorize [facility] and its employed or contracted physicians, physician assistants, nurse practitioners or other licensed health care professionals (the Practitioner), to provide me with telemedicine health care services (the "Services") as deemed necessary by the treating Practitioner. I acknowledge and consent to receive the Services by the Practitioner via telemedicine. I understand that the telemedicine visit will involve communicating with the Practitioner through live audiovisual communication technology and the disclosure of certain medical information by electronic transmission. I acknowledge that I have been given the opportunity to request an in-person assessment or other available alternative prior to the telemedicine visit and am voluntarily participating in the telemedicine visit.  I understand that I have the right to withhold or withdraw my consent to the use of telemedicine in the course of my care at any time, without affecting my right to future care or treatment, and that the Practitioner or I may terminate the telemedicine visit at any time. I understand that I have the right to inspect all information obtained and/or recorded in the course of the telemedicine visit and may receive copies of available information for a reasonable fee.  I understand that some of the potential risks of receiving the Services via telemedicine include:  Delay or interruption in medical evaluation due to technological equipment failure or disruption; Information transmitted may not be  sufficient (e.g. poor resolution of images) to allow for appropriate medical decision making by the Practitioner; and/or  In rare instances, security protocols could fail, causing a breach of personal health information.  Furthermore, I acknowledge that it is my responsibility to provide information about my medical history, conditions and care that is complete and accurate to the best of my ability. I acknowledge that Practitioner's advice, recommendations, and/or decision may be based on factors not within their control, such as incomplete or inaccurate data provided by me or distortions of diagnostic images or specimens that may result from electronic transmissions. I understand that the practice of medicine is not an exact science and that Practitioner makes no warranties or guarantees regarding treatment outcomes. I acknowledge that a copy of this consent can be made available to me via my patient portal [facility] MyChart), or I can request a printed copy by calling the office of [facility].    I understand that my insurance will be billed for this visit.   I have read or had this consent read to me. I understand the contents of this consent, which adequately explains the benefits and risks of the Services being provided via telemedicine.  I have been provided ample opportunity to ask questions regarding this consent and the Services and have had my questions answered to my satisfaction. I give my informed consent for the services to be provided through the use of telemedicine in my medical care

## 2023-02-24 NOTE — Telephone Encounter (Signed)
Patient is returning call to schedule a tele visit appt. Patient requested we call her back at [phone].

## 2023-02-24 NOTE — Telephone Encounter (Signed)
See clearance request fro further notes.

## 2023-02-24 NOTE — Telephone Encounter (Signed)
I s/w the pt and she has been scheduled for tele pre op appt 03/09/23. Med rec and consent are done.

## 2023-03-09 ENCOUNTER — Ambulatory Visit: Payer: BC Managed Care – PPO | Attending: Physician Assistant

## 2023-03-09 DIAGNOSIS — Z01810 Encounter for preprocedural cardiovascular examination: Secondary | ICD-10-CM | POA: Diagnosis not present

## 2023-03-09 NOTE — Progress Notes (Addendum)
Virtual Visit via Telephone Note   Because of [name]'s co-morbid illnesses, she is at least at moderate risk for complications without adequate follow up.  This format is felt to be most appropriate for this patient at this time.  The patient did not have access to video technology/had technical difficulties with video requiring transitioning to audio format only (telephone).  All issues noted in this document were discussed and addressed.  No physical exam could be performed with this format.  Please refer to the patient's chart for her consent to telehealth for [facility].  Evaluation Performed:  Preoperative cardiovascular risk assessment _____________   Date:  03/09/2023   Patient ID:  [name], DOB November 12, 1955, MRN [id number] Patient Location:  Home Provider location:   Office  Primary Care Provider:  Nelwyn Salisbury, MD Primary Cardiologist:  Olga Millers, MD  Chief Complaint / Patient Profile   67 y.o. y/o female with a h/o TIA, acute renal failure, atrial fibrillation, history of CVA, hypertension who is pending for teeth to be surgically extracted and presents today for telephonic preoperative cardiovascular risk assessment.  History of Present Illness    [name] is a 67 y.o. female who presents via audio/video conferencing for a telehealth visit today.  Pt was last seen in cardiology clinic on 12/05/2022 by Dr. Jens Som.  At that time [name] was doing well from a cardiovascular standpoint.  Echocardiogram 2024 showed normal LV function.  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  Anticoagulation was initiated..  The patient is now pending procedure as outlined above. Since her last visit, she tells me she has been doing well from a heart standpoint.  In 2017 she had an episode of atrial fibrillation and eventually came out of it.  Unfortunately she had a stroke at that time.  Could not get an MRI but CT scans  of the head were done.  Her stress was very high.  Most recent stroke was May of this year.  Osteoarthritis of her right hip which slows her down a little bit.  She has an appointment with orthopedic doctor November 22.  She uses a cane and is still working.  She does meet the METS requirements of greater than 4 METS.   Ideally do not want to hold anticoag given recent CVA. Would see if dentist can space apart extractions and complete them 1-2 at a time so that pt can continue on her anticoagulation uninterrupted.   The patient tells me that her teeth are in the front and she really does not want to do the procedure in 2 steps.  She would prefer that all 4 people at the same time.  Her neurologist mention may be doing Lovenox bridge in between doses of her anticoagulation.  I will have to discuss this with Dr. Jens Som.    Past Medical History    Past Medical History:  Diagnosis Date   Acute renal failure (ARF) (HCC)    Atrial fibrillation (HCC)    CVA (cerebral vascular accident) (HCC)    Headache(784.0)    Hypertension    Insomnia    Intracranial aneurysm    status post surgery   Neurotrophic keratitis of both eyes    sees Dr. Sinda Du of [facility]   Sjogren's syndrome [facility])    sees Dr. Alben Deeds    Past Surgical History:  Procedure Laterality Date   ABDOMINAL HYSTERECTOMY     CEREBRAL ANEURYSM  REPAIR  1998   By Dr Newell Coral   COLONOSCOPY  10/01/2015   per Dr. Marina Goodell, adenomatous polyps, repeat in 5 yrs    OVARIAN CYST REMOVAL      Allergies  Allergies  Allergen Reactions   Vibra-Tab [Doxycycline] Nausea Only and Other (See Comments)    Triggered A-fib    Home Medications    Prior to Admission medications   Medication Sig Start Date End Date Taking? Authorizing Provider  apixaban (ELIQUIS) 5 MG TABS tablet Take 1 tablet (5 mg total) by mouth 2 (two) times daily. 01/25/23   Lewayne Bunting, MD  moxifloxacin (VIGAMOX) 0.5 % ophthalmic solution Place 1 drop  into both eyes 3 (three) times daily. Continuous course.    [provider]  NIFEdipine (ADALAT CC) 30 MG 24 hr tablet Take 1 tablet (30 mg total) by mouth daily. 05/12/22   Nelwyn Salisbury, MD  Ondansetron HCl (ZOFRAN PO) Take 1 tablet by mouth daily as needed (nausea, vomiting).    [provider]  Polyvinyl Alcohol-Povidone (REFRESH OP) Place 1 drop into both eyes every 6 (six) hours as needed (dry eyes).    [provider]  zolpidem (AMBIEN) 10 MG tablet TAKE 1 TABLET BY MOUTH EVERYDAY AT BEDTIME 11/18/22   Nelwyn Salisbury, MD    Physical Exam    Vital Signs:  [name] does not have vital signs available for review today.  Given telephonic nature of communication, physical exam is limited. AAOx3. NAD. Normal affect.  Speech and respirations are unlabored.  Accessory Clinical Findings    None  Assessment & Plan    1.  Preoperative Cardiovascular Risk Assessment:  [name] perioperative risk of a major cardiac event is 0.9% according to the Revised Cardiac Risk Index (RCRI).  Therefore, she is at low risk for perioperative complications.   Her functional capacity is good at 5.07 METs according to the [employer] Activity Status Index (DASI). Recommendations: According to ACC/AHA guidelines, no further cardiovascular testing needed.  The patient may proceed to surgery at acceptable risk.   Antiplatelet and/or Anticoagulation Recommendations:  Patient should be on Eliquis without interruption.  ADDENDUM FROM DR. CRENSHAW:  I would prefer for her to continue anticoagulation as you outlined.  If she is unwilling and I do not think Lovenox bridging is indicated though she should understand there is a higher risk of CVA off of anticoagulation.  Would discontinue apixaban 2 days prior to procedure and resume the day after.  Olga Millers    The patient was advised that if she develops new symptoms prior to surgery to contact our office to arrange for a  follow-up visit, and she verbalized understanding.   A copy of this note will be routed to requesting surgeon.  Time:   Today, I have spent 9 minutes with the patient with telehealth technology discussing medical history, symptoms, and management plan.     Sharlene Dory, PA-C  03/09/2023, 11:39 AM

## 2023-04-23 ENCOUNTER — Other Ambulatory Visit: Payer: Self-pay | Admitting: Family Medicine

## 2023-04-24 ENCOUNTER — Telehealth: Payer: Self-pay | Admitting: Cardiology

## 2023-04-24 NOTE — Telephone Encounter (Signed)
 Patient is requesting call back to get update on clearance. Please advise.

## 2023-04-24 NOTE — Telephone Encounter (Signed)
Patient is calling to follow up on clearance. Please advise.

## 2023-04-25 NOTE — Telephone Encounter (Signed)
Patient is calling back. Please advise  

## 2023-04-26 NOTE — Telephone Encounter (Signed)
Per Tessa's note from 03/09/2023, Dr. Ludwig Clarks preference is that Eliquis is not held at all prior to her dental extractions. However, if the dentist is not comfortable with this, then it can be held for 2 days prior to procedure and resumed the day after (although patient should be aware that there is a higher risk of stroke off of anticoagulation). I will re-route Tessa's note to the dentist office and patient can follow-up with her dentist to see what they would like to do.   Pre-op covering team, can you please notify patient of this?  Thank you! Taylah Dubiel

## 2023-04-26 NOTE — Telephone Encounter (Signed)
Spoke with the patient who states she wants to know how long she needs to hold the Eliquis.   I advised the patient that I would speak with the requesting providers office to see if anything is needed and I will also send a message to the preop providers to see what they recommend.   Patient voiced understanding.    Is this what I need to tell the patient? And I left a message for the dental office to see if they have received a copy of Tessa's telehealth note.   ADDENDUM FROM DR. CRENSHAW:  I would prefer for her to continue anticoagulation as you outlined.  If she is unwilling and I do not think Lovenox bridging is indicated though she should understand there is a higher risk of CVA off of anticoagulation.  Would discontinue apixaban 2 days prior to procedure and resume the day after.  Olga Millers

## 2023-04-27 NOTE — Telephone Encounter (Signed)
Left message for the pt of Dr. Ludwig Clarks recommendations to NOT hold her Eliquis prior to dental procedure. Dr. Waunita Schooner recommendations have been sent to the dental office.   If questions, call the office back.   I did not leave message on vm higher risk for stroke while off Eliquis. I did not want to leave this on a vm.

## 2023-04-27 NOTE — Telephone Encounter (Signed)
YouJust now (2:19 PM)    Left message for the pt of Dr. Ludwig Clarks recommendations to NOT hold her Eliquis prior to dental procedure. Dr. Waunita Schooner recommendations have been sent to the dental office.    If questions, call the office back.    I did not leave message on vm higher risk for stroke while off Eliquis. I did not want to leave this on a vm.        Note   You  Kolarik, Pina Kelm" [phone] minutes ago (2:16 PM)   Corrin Parker, PA-C routed conversation to Cv Div Preop CallbackYesterday (1:38 PM)   Corrin Parker, PA-CYesterday (1:38 PM)    Per Tessa's note from 03/09/2023, Dr. Ludwig Clarks preference is that Eliquis is not held at all prior to her dental extractions. However, if the dentist is not comfortable with this, then it can be held for 2 days prior to procedure and resumed the day after (although patient should be aware that there is a higher risk of stroke off of anticoagulation). I will re-route Tessa's note to the dentist office and patient can follow-up with her dentist to see what they would like to do.    Pre-op covering team, can you please notify patient of this?   Thank you! Callie      Note   Alveta Heimlich, CMA routed conversation to [employer]; Cv Div Preop CallbackYesterday (1:25 PM)   Alveta Heimlich, CMAYesterday (1:23 PM)    Spoke with the patient who states she wants to know how long she needs to hold the Eliquis.    I advised the patient that I would speak with the requesting providers office to see if anything is needed and I will also send a message to the preop providers to see what they recommend.    Patient voiced understanding.      Is this what I need to tell the patient? And I left a message for the dental office to see if they have received a copy of Tessa's telehealth note.    ADDENDUM FROM DR. CRENSHAW:  I would prefer for her to continue anticoagulation as you outlined.  If she is unwilling and I  do not think Lovenox bridging is indicated though she should understand there is a higher risk of CVA off of anticoagulation.  Would discontinue apixaban 2 days prior to procedure and resume the day after.  Olga Millers       Note   Alveta Heimlich, CMA  Juarbe, Audianna Valone "[name]" [phone] (1:13 PM)   Threasa Alpha A routed conversation to Cv Div Preop Callback2 days ago   Threasa Alpha A2 days ago   SS Patient is calling back. Please advise       Note   Kamron, Marklin "[name]" [phone]  Threasa Alpha A2 days ago   Tawni Millers routed conversation to Cv Div Preop Callback3 days ago   Earl Lagos K3 days ago   AF Patient is calling to follow up on clearance. Please advise.      Note

## 2023-05-12 ENCOUNTER — Telehealth: Payer: Self-pay

## 2023-05-12 NOTE — Telephone Encounter (Signed)
Pharmacy please advise on holding Eliquis prior to right total hip arthroplasty scheduled for 08/15/2023. Thank you.

## 2023-05-12 NOTE — Telephone Encounter (Signed)
   Pre-operative Risk Assessment    Patient Name: [name]  DOB: 1955-10-22 MRN: [id number]    LOV: 03/09/23 - PRE-OP TELE VISIT  NOV: NONE   Request for Surgical Clearance    Procedure:   RIGHT TOTAL HIP ARTHOPLASTY   Date of Surgery:  Clearance 08/15/23                                 Surgeon:  DR. Ollen Gross Surgeon's Group or Practice Name:  [facility] Phone number:  [phone] Fax number:  [phone]   Type of Clearance Requested:   - Pharmacy:  Hold Apixaban (Eliquis) NEEDS INSTRUCTIONS WHEN TO HOLD    Type of Anesthesia:   CHOICE    Additional requests/questions:    [name]   05/12/2023, 5:07 PM

## 2023-05-15 NOTE — Telephone Encounter (Signed)
Patient with diagnosis of atrial fibrillation on Eliquis for anticoagulation.    Procedure:   RIGHT TOTAL HIP ARTHOPLASTY    Date of Surgery:  Clearance 08/15/23     CHA2DS2-VASc Score = 5   This indicates a 7.2% annual risk of stroke. The patient's score is based upon: CHF History: 0 HTN History: 1 Diabetes History: 0 Stroke History: 2 Vascular Disease History: 0 Age Score: 1 Gender Score: 1   CVA 09/30/22 - will be over 10 months by time of scheduled arthroplasty  CrCl 72 Platelet count 228  Per office protocol, patient can hold Eliquis for 3 days prior to procedure.   Patient will not need bridging with Lovenox (enoxaparin) around procedure.  **This guidance is not considered finalized until pre-operative APP has relayed final recommendations.**

## 2023-05-15 NOTE — Telephone Encounter (Signed)
   Patient Name: [name]  DOB: 1956/01/31 MRN: [id number]  Primary Cardiologist: Olga Millers, MD  Clinical pharmacists have reviewed the patient's past medical history, labs, and current medications as part of preoperative protocol coverage. The following recommendations have been made:  Per office protocol, patient can hold Eliquis for 3 days prior to procedure.   Patient will not need bridging with Lovenox (enoxaparin) around procedure.  I will route this recommendation to the requesting party via Epic fax function and remove from pre-op pool.  Please call with questions.  Denyce Robert, NP 05/15/2023, 2:18 PM

## 2023-05-20 ENCOUNTER — Other Ambulatory Visit: Payer: Self-pay | Admitting: Family Medicine

## 2023-05-22 NOTE — Telephone Encounter (Signed)
Pt LOV  was on 10/07/22 Last refill was done on 11/18/22 Please advise

## 2023-07-20 ENCOUNTER — Other Ambulatory Visit: Payer: Self-pay | Admitting: Cardiology

## 2023-07-20 DIAGNOSIS — I480 Paroxysmal atrial fibrillation: Secondary | ICD-10-CM

## 2023-07-21 NOTE — Telephone Encounter (Signed)
 Prescription refill request for Eliquis received. Indication:afib Last office visit:7/24 Scr:0.82  5/24 Age: 68 Weight:68  kg  Prescription refilled

## 2023-08-01 ENCOUNTER — Telehealth: Payer: Self-pay | Admitting: Family Medicine

## 2023-08-01 NOTE — Telephone Encounter (Signed)
 Copied from CRM [id number]. Topic: Medical Record Request - Records Request >> Aug 01, 2023  9:49 AM Sim Boast F wrote: Reason for CRM: [facility] calling to request patient labs and last office visit for surgical clearance. Please fax to [phone]. Call back number if needed is [phone].

## 2023-08-04 NOTE — Telephone Encounter (Signed)
 Medical records faxed to [facility] as requested

## 2023-08-15 HISTORY — PX: TOTAL HIP ARTHROPLASTY: SHX124

## 2023-09-20 ENCOUNTER — Ambulatory Visit
Admission: RE | Admit: 2023-09-20 | Discharge: 2023-09-20 | Disposition: A | Discharge status: Home / Self Care | Source: Ambulatory Visit | Attending: Family Medicine | Admitting: Family Medicine

## 2023-09-20 ENCOUNTER — Other Ambulatory Visit: Payer: Self-pay | Admitting: Family Medicine

## 2023-09-20 DIAGNOSIS — Z1231 Encounter for screening mammogram for malignant neoplasm of breast: Secondary | ICD-10-CM

## 2023-12-06 ENCOUNTER — Other Ambulatory Visit: Payer: Self-pay | Admitting: Family Medicine

## 2024-01-28 ENCOUNTER — Other Ambulatory Visit: Payer: Self-pay | Admitting: Cardiology

## 2024-01-28 DIAGNOSIS — I480 Paroxysmal atrial fibrillation: Secondary | ICD-10-CM

## 2024-01-29 NOTE — Telephone Encounter (Signed)
 Prescription refill request for Eliquis  received. Indication:afib Last office visit:needs appt Scr:na Age:68 Weight:68  kg  Prescription refilled

## 2024-02-13 ENCOUNTER — Telehealth: Payer: Self-pay | Admitting: *Deleted

## 2024-02-13 NOTE — Telephone Encounter (Signed)
 Copied from CRM #[id number]. Topic: Appointments - Scheduling Inquiry for Clinic >> Feb 13, 2024 10:16 AM Zy'onna H wrote: Reason for CRM: The patient wanted to know if for her upcoming Annual Physical with Johnny, MD scheduled 04/24/2024, should she fast/cease eating/drinking (fluids other than water) after 12 am the previous day, or will she have the labs completed after the appointment with the MD?  PCP/Care Team Please Advise - Patient requested callback.

## 2024-02-28 ENCOUNTER — Other Ambulatory Visit: Payer: Self-pay | Admitting: Cardiology

## 2024-02-28 DIAGNOSIS — I480 Paroxysmal atrial fibrillation: Secondary | ICD-10-CM

## 2024-02-28 NOTE — Telephone Encounter (Signed)
 Prescription refill request for Eliquis  received. Indication:afib Last office visit:upcoming Drm:wzzid labs Age: 68 Weight:68  kg  Prescription refilled

## 2024-04-11 NOTE — Progress Notes (Signed)
 HPI: Follow-up atrial fibrillation and history of TIA. She was previously admitted 2017 following an insect bite and was noted to be in atrial fibrillation. She was felt to have pericarditis as well. CTA July 2017 showed no pulmonary embolus.  Echocardiogram August 2017 showed normal LV systolic function, grade 2 diastolic dysfunction, mild right atrial enlargement and moderate tricuspid regurgitation. Patient was placed on amiodarone  as her blood pressure was borderline. However it was felt this would not need to be continued long-term as atrial arrhythmia was felt likely secondary to her pericarditis. TSH normal. Amiodarone  DCed 8/17.  Previously admitted May 2024 with CVA.  CTA showed no hemodynamically significant stenosis.  Echocardiogram May 2024 showed normal LV function.  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  Anticoagulation was initiated.  Since last seen, the patient denies any dyspnea on exertion, orthopnea, PND, pedal edema, palpitations, syncope or chest pain.  No bleeding.   Current Outpatient Medications  Medication Sig Dispense Refill   apixaban  (ELIQUIS ) 5 MG TABS tablet Take 1 tablet (5 mg total) by mouth 2 (two) times daily. 60 tablet 5   NIFEdipine  (ADALAT  CC) 30 MG 24 hr tablet TAKE 1 TABLET BY MOUTH EVERY DAY 90 tablet 3   omeprazole (PRILOSEC) 40 MG capsule Take 1 capsule (40 mg total) by mouth every morning. 90 capsule 3   Ondansetron  HCl (ZOFRAN  PO) Take 1 tablet by mouth daily as needed (nausea, vomiting).     Vitamin D, Ergocalciferol, (DRISDOL) 1.25 MG (50000 UNIT) CAPS capsule Take 1 capsule (50,000 Units total) by mouth every 7 (seven) days. 12 capsule 3   zolpidem  (AMBIEN ) 10 MG tablet TAKE 1 TABLET BY MOUTH EVERYDAY AT BEDTIME 30 tablet 5   Polyvinyl Alcohol-Povidone (REFRESH OP) Place 1 drop into both eyes every 6 (six) hours as needed (dry eyes). (Patient not taking: Reported on 04/24/2024)     No current facility-administered  medications for this visit.     Past Medical History:  Diagnosis Date   Acute renal failure (ARF)    Atrial fibrillation (HCC)    CVA (cerebral vascular accident) (HCC)    Headache(784.0)    Hypertension    Insomnia    Intracranial aneurysm    status post surgery   Neurotrophic keratitis of both eyes    sees Dr. Adine Haddock of [facility] [place] [facility]   Sjogren's syndrome    sees Dr. Lynwood Ramsay     Past Surgical History:  Procedure Laterality Date   ABDOMINAL HYSTERECTOMY     CEREBRAL ANEURYSM REPAIR  1998   By Dr Alix   COLONOSCOPY  10/01/2015   per Dr. Abran, adenomatous polyps, repeat in 5 yrs    OVARIAN CYST REMOVAL     TOTAL HIP ARTHROPLASTY Right 08/15/2023   per Dr. Melodi    Social History   Socioeconomic History   Marital status: Divorced    Spouse name: Not on file   Number of children: 1   Years of education: Not on file   Highest education level: Not on file  Occupational History   Occupation: Deputy Solicitor    Comment: [employer]  Tobacco Use   Smoking status: Never   Smokeless tobacco: Never  Substance and Sexual Activity   Alcohol use: Not Currently    Comment: social   Drug use: No   Sexual activity: Not on file  Other Topics Concern   Not on file  Social History Narrative   Not on file  Social Drivers of Corporate Investment Banker Strain: Not on file  Food Insecurity: No Food Insecurity (09/30/2022)   Hunger Vital Sign    Worried About Running Out of Food in the Last Year: Never true    Ran Out of Food in the Last Year: Never true  Transportation Needs: No Transportation Needs (09/30/2022)   PRAPARE - Administrator, Civil Service (Medical): No    Lack of Transportation (Non-Medical): No  Physical Activity: Not on file  Stress: Not on file  Social Connections: Not on file  Intimate Partner Violence: Not At Risk (09/30/2022)   Humiliation, Afraid, Rape, and Kick questionnaire    Fear of Current or Ex-Partner: No     Emotionally Abused: No    Physically Abused: No    Sexually Abused: No    Family History  Problem Relation Age of Onset   Hypertension Mother    Cholelithiasis Mother    Scoliosis Mother    Arthritis Mother    Sudden death Father    Aneurysm Father        brain   Anuerysm Sister        brain   Cholelithiasis Daughter    Aneurysm Maternal Uncle    Colon cancer Neg Hx    Esophageal cancer Neg Hx    Pancreatic cancer Neg Hx    Rectal cancer Neg Hx    Stomach cancer Neg Hx    Breast cancer Neg Hx    BRCA 1/2 Neg Hx     ROS: no fevers or chills, productive cough, hemoptysis, dysphasia, odynophagia, melena, hematochezia, dysuria, hematuria, rash, seizure activity, orthopnea, PND, pedal edema, claudication. Remaining systems are negative.  Physical Exam: Well-developed well-nourished in no acute distress.  Skin is warm and dry.  HEENT is normal.  Neck is supple.  Chest is clear to auscultation with normal expansion.  Cardiovascular exam is regular rate and rhythm.  Abdominal exam nontender or distended. No masses palpated. Extremities show no edema. neuro grossly intact  EKG Interpretation Date/Time:  Wednesday April 24 2024 16:26:52 EST Ventricular Rate:  71 PR Interval:  150 QRS Duration:  76 QT Interval:  392 QTC Calculation: 425 R Axis:   15  Text Interpretation: Normal sinus rhythm Confirmed by Pietro Rogue ([id number]) on 04/24/2024 4:32:16 PM     A/P  1 history of atrial fibrillation-patient had transient atrial fibrillation in the past in association with pericarditis treated with amiodarone .  Anticoagulation/amiodarone  ultimately discontinued.  She then returned approximately 7 years later with a CVA.  Given that history she has been felt to require long-term anticoagulation.  Continue apixaban .  She remains in sinus.  2 hypertension-blood pressure controlled.  Continue present medical regimen.  Rogue Pietro, MD

## 2024-04-22 ENCOUNTER — Other Ambulatory Visit: Payer: Self-pay | Admitting: Family Medicine

## 2024-04-24 ENCOUNTER — Encounter: Payer: Self-pay | Admitting: Family Medicine

## 2024-04-24 ENCOUNTER — Encounter: Payer: Self-pay | Admitting: Cardiology

## 2024-04-24 ENCOUNTER — Ambulatory Visit: Payer: Self-pay | Admitting: Family Medicine

## 2024-04-24 ENCOUNTER — Ambulatory Visit (INDEPENDENT_AMBULATORY_CARE_PROVIDER_SITE_OTHER): Admitting: Family Medicine

## 2024-04-24 ENCOUNTER — Ambulatory Visit: Payer: Self-pay | Attending: Cardiology | Admitting: Cardiology

## 2024-04-24 VITALS — BP 110/60 | HR 71 | Temp 98.2°F | Ht 67.0 in | Wt 169.0 lb

## 2024-04-24 VITALS — BP 132/80 | HR 71 | Ht 67.0 in | Wt 166.0 lb

## 2024-04-24 DIAGNOSIS — E559 Vitamin D deficiency, unspecified: Secondary | ICD-10-CM | POA: Diagnosis not present

## 2024-04-24 DIAGNOSIS — K219 Gastro-esophageal reflux disease without esophagitis: Secondary | ICD-10-CM | POA: Diagnosis not present

## 2024-04-24 DIAGNOSIS — M810 Age-related osteoporosis without current pathological fracture: Secondary | ICD-10-CM | POA: Diagnosis not present

## 2024-04-24 DIAGNOSIS — Z0000 Encounter for general adult medical examination without abnormal findings: Secondary | ICD-10-CM | POA: Diagnosis not present

## 2024-04-24 DIAGNOSIS — I10 Essential (primary) hypertension: Secondary | ICD-10-CM

## 2024-04-24 DIAGNOSIS — I480 Paroxysmal atrial fibrillation: Secondary | ICD-10-CM

## 2024-04-24 LAB — HEPATIC FUNCTION PANEL
ALT: 14 U/L (ref 0–35)
AST: 18 U/L (ref 0–37)
Albumin: 4.1 g/dL (ref 3.5–5.2)
Alkaline Phosphatase: 78 U/L (ref 39–117)
Bilirubin, Direct: 0.1 mg/dL (ref 0.0–0.3)
Total Bilirubin: 0.5 mg/dL (ref 0.2–1.2)
Total Protein: 8.4 g/dL — ABNORMAL HIGH (ref 6.0–8.3)

## 2024-04-24 LAB — LIPID PANEL
Cholesterol: 114 mg/dL (ref 0–200)
HDL: 47 mg/dL (ref >39.00)
LDL Cholesterol: 53 mg/dL (ref 0–99)
NonHDL: 67.21
Total CHOL/HDL Ratio: 2
Triglycerides: 71 mg/dL (ref 0.0–149.0)
VLDL: 14.2 mg/dL (ref 0.0–40.0)

## 2024-04-24 LAB — BASIC METABOLIC PANEL WITH GFR
BUN: 11 mg/dL (ref 6–23)
CO2: 25 meq/L (ref 19–32)
Calcium: 9.4 mg/dL (ref 8.4–10.5)
Chloride: 106 meq/L (ref 96–112)
Creatinine, Ser: 0.82 mg/dL (ref 0.40–1.20)
GFR: 73.89 mL/min (ref >60.00)
Glucose, Bld: 100 mg/dL — ABNORMAL HIGH (ref 70–99)
Potassium: 4.2 meq/L (ref 3.5–5.1)
Sodium: 137 meq/L (ref 135–145)

## 2024-04-24 LAB — CBC WITH DIFFERENTIAL/PLATELET
Basophils Absolute: 0 K/uL (ref 0.0–0.1)
Basophils Relative: 0.7 % (ref 0.0–3.0)
Eosinophils Absolute: 0.2 K/uL (ref 0.0–0.7)
Eosinophils Relative: 5.3 % — ABNORMAL HIGH (ref 0.0–5.0)
HCT: 37.9 % (ref 36.0–46.0)
Hemoglobin: 12.6 g/dL (ref 12.0–15.0)
Lymphocytes Relative: 25.5 % (ref 12.0–46.0)
Lymphs Abs: 1.2 K/uL (ref 0.7–4.0)
MCHC: 33.1 g/dL (ref 30.0–36.0)
MCV: 87.8 fl (ref 78.0–100.0)
Monocytes Absolute: 0.7 K/uL (ref 0.1–1.0)
Monocytes Relative: 14.2 % — ABNORMAL HIGH (ref 3.0–12.0)
Neutro Abs: 2.5 K/uL (ref 1.4–7.7)
Neutrophils Relative %: 54.3 % (ref 43.0–77.0)
Platelets: 250 K/uL (ref 150.0–400.0)
RBC: 4.32 Mil/uL (ref 3.87–5.11)
RDW: 13.5 % (ref 11.5–15.5)
WBC: 4.6 K/uL (ref 4.0–10.5)

## 2024-04-24 LAB — MAGNESIUM: Magnesium: 2 mg/dL (ref 1.5–2.5)

## 2024-04-24 LAB — HEMOGLOBIN A1C: Hgb A1c MFr Bld: 5.4 % (ref 4.6–6.5)

## 2024-04-24 LAB — TSH: TSH: 1.28 u[IU]/mL (ref 0.35–5.50)

## 2024-04-24 LAB — VITAMIN D 25 HYDROXY (VIT D DEFICIENCY, FRACTURES): VITD: 12.55 ng/mL — ABNORMAL LOW (ref 30.00–100.00)

## 2024-04-24 MED ORDER — OMEPRAZOLE 40 MG PO CPDR: 40.0000 mg | DELAYED_RELEASE_CAPSULE | Freq: Every morning | ORAL | 3 refills | Status: AC

## 2024-04-24 MED ORDER — VITAMIN D (ERGOCALCIFEROL) 1.25 MG (50000 UNIT) PO CAPS: 50000.0000 [IU] | ORAL_CAPSULE | ORAL | 3 refills | Status: AC

## 2024-04-24 NOTE — Progress Notes (Signed)
 Subjective:    Patient ID: [name], female    DOB: [birth year], 68 y.o.   MRN: [id number]  HPI Here for a well exam. She has been doing well in general, but she does mention occasional stoppage of food going down her esophagus when eating. This clears after a few minutes. It is uncomfortable but not painful. This is especially common when eating rice or French fries. She hhas also had some heartburn the past few months. She had a right total hip replacement in March, and this went very well.    Review of Systems  Constitutional: Negative.   HENT:  Positive for trouble swallowing.   Eyes: Negative.   Respiratory: Negative.    Cardiovascular: Negative.   Gastrointestinal: Negative.   Genitourinary:  Negative for decreased urine volume, difficulty urinating, dyspareunia, dysuria, enuresis, flank pain, frequency, hematuria, pelvic pain and urgency.  Musculoskeletal: Negative.   Skin: Negative.   Neurological: Negative.  Negative for headaches.  Psychiatric/Behavioral: Negative.         Objective:   Physical Exam Constitutional:      General: She is not in acute distress.    Appearance: Normal appearance. She is well-developed.  HENT:     Head: Normocephalic and atraumatic.     Right Ear: External ear normal.     Left Ear: External ear normal.     Nose: Nose normal.     Mouth/Throat:     Pharynx: No oropharyngeal exudate.  Eyes:     General: No scleral icterus.    Conjunctiva/sclera: Conjunctivae normal.     Pupils: Pupils are equal, round, and reactive to light.  Neck:     Thyroid : No thyromegaly.     Vascular: No JVD.  Cardiovascular:     Rate and Rhythm: Normal rate and regular rhythm.     Pulses: Normal pulses.     Heart sounds: Normal heart sounds. No murmur heard.    No friction rub. No gallop.  Pulmonary:     Effort: Pulmonary effort is normal. No respiratory distress.     Breath sounds: Normal breath sounds. No wheezing or rales.  Chest:     Chest wall:  No tenderness.  Abdominal:     General: Bowel sounds are normal. There is no distension.     Palpations: Abdomen is soft. There is no mass.     Tenderness: There is no abdominal tenderness. There is no guarding or rebound.  Musculoskeletal:        General: No tenderness. Normal range of motion.     Cervical back: Normal range of motion and neck supple.  Lymphadenopathy:     Cervical: No cervical adenopathy.  Skin:    General: Skin is warm and dry.     Findings: No erythema or rash.  Neurological:     General: No focal deficit present.     Mental Status: She is alert and oriented to person, place, and time.     Cranial Nerves: No cranial nerve deficit.     Motor: No abnormal muscle tone.     Coordination: Coordination normal.     Deep Tendon Reflexes: Reflexes are normal and symmetric. Reflexes normal.  Psychiatric:        Mood and Affect: Mood normal.        Behavior: Behavior normal.        Thought Content: Thought content normal.        Judgment: Judgment normal.  Assessment & Plan:  Well exam. We discussed diet and exercise. Get fasting labs. She has developed some GERD, so she will start taking Omeprazole  40 mg every morning. Hopefully her swallowing will return to normal after taking this, and she will let us  know if it does not. Set up another colonoscopy. Set up her first DEXA. She will follow up with Cardiology later today.  Garnette Olmsted, MD

## 2024-04-24 NOTE — Addendum Note (Signed)
 Addended by: JOHNNY SENIOR A on: 04/24/2024 12:55 PM   Modules accepted: Orders

## 2024-04-24 NOTE — Patient Instructions (Signed)
